# Patient Record
Sex: Male | Born: 1935
Health system: Southern US, Community
[De-identification: ages and names within clinical notes are randomized; demographics above are authoritative.]

## PROBLEM LIST (undated history)

## (undated) DIAGNOSIS — I10 Essential (primary) hypertension: Secondary | ICD-10-CM

## (undated) DIAGNOSIS — N200 Calculus of kidney: Secondary | ICD-10-CM

## (undated) DIAGNOSIS — E119 Type 2 diabetes mellitus without complications: Secondary | ICD-10-CM

## (undated) DIAGNOSIS — E785 Hyperlipidemia, unspecified: Secondary | ICD-10-CM

## (undated) DIAGNOSIS — K429 Umbilical hernia without obstruction or gangrene: Secondary | ICD-10-CM

## (undated) DIAGNOSIS — F411 Generalized anxiety disorder: Secondary | ICD-10-CM

## (undated) HISTORY — PX: INGUINAL HERNIA REPAIR: SUR1180

## (undated) HISTORY — PX: KIDNEY STONE SURGERY: SHX686

## (undated) HISTORY — DX: Hyperlipidemia, unspecified: E78.5

## (undated) HISTORY — PX: CHOLECYSTECTOMY: SHX55

## (undated) HISTORY — DX: Type 2 diabetes mellitus without complications: E11.9

## (undated) HISTORY — DX: Calculus of kidney: N20.0

## (undated) HISTORY — DX: Umbilical hernia without obstruction or gangrene: K42.9

## (undated) HISTORY — DX: Generalized anxiety disorder: F41.1

## (undated) HISTORY — DX: Essential (primary) hypertension: I10

---

## 2011-02-17 DIAGNOSIS — I10 Essential (primary) hypertension: Secondary | ICD-10-CM | POA: Diagnosis not present

## 2011-02-17 DIAGNOSIS — I2510 Atherosclerotic heart disease of native coronary artery without angina pectoris: Secondary | ICD-10-CM | POA: Diagnosis not present

## 2011-02-17 DIAGNOSIS — E291 Testicular hypofunction: Secondary | ICD-10-CM | POA: Diagnosis not present

## 2011-06-23 DIAGNOSIS — M999 Biomechanical lesion, unspecified: Secondary | ICD-10-CM | POA: Diagnosis not present

## 2011-06-23 DIAGNOSIS — M9981 Other biomechanical lesions of cervical region: Secondary | ICD-10-CM | POA: Diagnosis not present

## 2011-06-23 DIAGNOSIS — M5030 Other cervical disc degeneration, unspecified cervical region: Secondary | ICD-10-CM | POA: Diagnosis not present

## 2011-06-23 DIAGNOSIS — IMO0002 Reserved for concepts with insufficient information to code with codable children: Secondary | ICD-10-CM | POA: Diagnosis not present

## 2011-09-03 DIAGNOSIS — L981 Factitial dermatitis: Secondary | ICD-10-CM | POA: Diagnosis not present

## 2011-09-03 DIAGNOSIS — C44319 Basal cell carcinoma of skin of other parts of face: Secondary | ICD-10-CM | POA: Diagnosis not present

## 2011-09-03 DIAGNOSIS — R233 Spontaneous ecchymoses: Secondary | ICD-10-CM | POA: Diagnosis not present

## 2011-12-04 DIAGNOSIS — C44320 Squamous cell carcinoma of skin of unspecified parts of face: Secondary | ICD-10-CM | POA: Diagnosis not present

## 2011-12-04 DIAGNOSIS — L570 Actinic keratosis: Secondary | ICD-10-CM | POA: Diagnosis not present

## 2012-02-18 DIAGNOSIS — IMO0002 Reserved for concepts with insufficient information to code with codable children: Secondary | ICD-10-CM | POA: Diagnosis not present

## 2012-02-18 DIAGNOSIS — M999 Biomechanical lesion, unspecified: Secondary | ICD-10-CM | POA: Diagnosis not present

## 2012-02-18 DIAGNOSIS — M9981 Other biomechanical lesions of cervical region: Secondary | ICD-10-CM | POA: Diagnosis not present

## 2012-02-18 DIAGNOSIS — M5030 Other cervical disc degeneration, unspecified cervical region: Secondary | ICD-10-CM | POA: Diagnosis not present

## 2012-03-18 DIAGNOSIS — M9981 Other biomechanical lesions of cervical region: Secondary | ICD-10-CM | POA: Diagnosis not present

## 2012-03-18 DIAGNOSIS — IMO0002 Reserved for concepts with insufficient information to code with codable children: Secondary | ICD-10-CM | POA: Diagnosis not present

## 2012-03-24 DIAGNOSIS — I10 Essential (primary) hypertension: Secondary | ICD-10-CM | POA: Diagnosis not present

## 2012-03-24 DIAGNOSIS — R5381 Other malaise: Secondary | ICD-10-CM | POA: Diagnosis not present

## 2012-03-31 DIAGNOSIS — E119 Type 2 diabetes mellitus without complications: Secondary | ICD-10-CM | POA: Diagnosis not present

## 2012-03-31 DIAGNOSIS — E785 Hyperlipidemia, unspecified: Secondary | ICD-10-CM | POA: Diagnosis not present

## 2012-03-31 DIAGNOSIS — G4700 Insomnia, unspecified: Secondary | ICD-10-CM | POA: Diagnosis not present

## 2012-06-02 DIAGNOSIS — H2510 Age-related nuclear cataract, unspecified eye: Secondary | ICD-10-CM | POA: Diagnosis not present

## 2012-09-30 DIAGNOSIS — K4090 Unilateral inguinal hernia, without obstruction or gangrene, not specified as recurrent: Secondary | ICD-10-CM | POA: Diagnosis not present

## 2012-09-30 DIAGNOSIS — R109 Unspecified abdominal pain: Secondary | ICD-10-CM | POA: Diagnosis not present

## 2012-09-30 DIAGNOSIS — N390 Urinary tract infection, site not specified: Secondary | ICD-10-CM | POA: Diagnosis not present

## 2012-09-30 DIAGNOSIS — R351 Nocturia: Secondary | ICD-10-CM | POA: Diagnosis not present

## 2012-10-05 DIAGNOSIS — K4090 Unilateral inguinal hernia, without obstruction or gangrene, not specified as recurrent: Secondary | ICD-10-CM | POA: Diagnosis not present

## 2012-10-05 DIAGNOSIS — I10 Essential (primary) hypertension: Secondary | ICD-10-CM | POA: Diagnosis not present

## 2012-10-05 DIAGNOSIS — E119 Type 2 diabetes mellitus without complications: Secondary | ICD-10-CM | POA: Diagnosis not present

## 2012-10-05 DIAGNOSIS — N400 Enlarged prostate without lower urinary tract symptoms: Secondary | ICD-10-CM | POA: Diagnosis not present

## 2012-10-05 DIAGNOSIS — R109 Unspecified abdominal pain: Secondary | ICD-10-CM | POA: Diagnosis not present

## 2012-10-05 DIAGNOSIS — N401 Enlarged prostate with lower urinary tract symptoms: Secondary | ICD-10-CM | POA: Diagnosis not present

## 2012-10-05 DIAGNOSIS — N390 Urinary tract infection, site not specified: Secondary | ICD-10-CM | POA: Diagnosis not present

## 2012-11-17 DIAGNOSIS — N390 Urinary tract infection, site not specified: Secondary | ICD-10-CM | POA: Diagnosis not present

## 2012-11-17 DIAGNOSIS — N401 Enlarged prostate with lower urinary tract symptoms: Secondary | ICD-10-CM | POA: Diagnosis not present

## 2012-11-17 DIAGNOSIS — K4090 Unilateral inguinal hernia, without obstruction or gangrene, not specified as recurrent: Secondary | ICD-10-CM | POA: Diagnosis not present

## 2012-11-17 DIAGNOSIS — N318 Other neuromuscular dysfunction of bladder: Secondary | ICD-10-CM | POA: Diagnosis not present

## 2013-01-05 DIAGNOSIS — I10 Essential (primary) hypertension: Secondary | ICD-10-CM | POA: Diagnosis not present

## 2013-01-05 DIAGNOSIS — H9319 Tinnitus, unspecified ear: Secondary | ICD-10-CM | POA: Diagnosis not present

## 2013-01-05 DIAGNOSIS — E1149 Type 2 diabetes mellitus with other diabetic neurological complication: Secondary | ICD-10-CM | POA: Diagnosis not present

## 2013-01-05 DIAGNOSIS — E119 Type 2 diabetes mellitus without complications: Secondary | ICD-10-CM | POA: Diagnosis not present

## 2013-01-06 DIAGNOSIS — E119 Type 2 diabetes mellitus without complications: Secondary | ICD-10-CM | POA: Diagnosis not present

## 2013-06-05 DIAGNOSIS — H2510 Age-related nuclear cataract, unspecified eye: Secondary | ICD-10-CM | POA: Diagnosis not present

## 2013-07-10 DIAGNOSIS — G5600 Carpal tunnel syndrome, unspecified upper limb: Secondary | ICD-10-CM | POA: Diagnosis not present

## 2014-02-08 DIAGNOSIS — E119 Type 2 diabetes mellitus without complications: Secondary | ICD-10-CM | POA: Diagnosis not present

## 2014-02-08 DIAGNOSIS — I10 Essential (primary) hypertension: Secondary | ICD-10-CM | POA: Diagnosis not present

## 2014-02-08 DIAGNOSIS — R5382 Chronic fatigue, unspecified: Secondary | ICD-10-CM | POA: Diagnosis not present

## 2014-02-13 DIAGNOSIS — E559 Vitamin D deficiency, unspecified: Secondary | ICD-10-CM | POA: Diagnosis not present

## 2014-02-13 DIAGNOSIS — E780 Pure hypercholesterolemia: Secondary | ICD-10-CM | POA: Diagnosis not present

## 2014-02-13 DIAGNOSIS — Z79810 Long term (current) use of selective estrogen receptor modulators (SERMs): Secondary | ICD-10-CM | POA: Diagnosis not present

## 2014-02-13 DIAGNOSIS — R972 Elevated prostate specific antigen [PSA]: Secondary | ICD-10-CM | POA: Diagnosis not present

## 2014-02-13 DIAGNOSIS — R531 Weakness: Secondary | ICD-10-CM | POA: Diagnosis not present

## 2014-02-13 DIAGNOSIS — E119 Type 2 diabetes mellitus without complications: Secondary | ICD-10-CM | POA: Diagnosis not present

## 2014-03-24 DIAGNOSIS — L820 Inflamed seborrheic keratosis: Secondary | ICD-10-CM | POA: Diagnosis not present

## 2014-03-24 DIAGNOSIS — L304 Erythema intertrigo: Secondary | ICD-10-CM | POA: Diagnosis not present

## 2014-03-24 DIAGNOSIS — L0100 Impetigo, unspecified: Secondary | ICD-10-CM | POA: Diagnosis not present

## 2014-06-04 DIAGNOSIS — E119 Type 2 diabetes mellitus without complications: Secondary | ICD-10-CM | POA: Diagnosis not present

## 2014-06-04 DIAGNOSIS — I10 Essential (primary) hypertension: Secondary | ICD-10-CM | POA: Diagnosis not present

## 2014-06-04 DIAGNOSIS — E785 Hyperlipidemia, unspecified: Secondary | ICD-10-CM | POA: Diagnosis not present

## 2014-06-18 DIAGNOSIS — E119 Type 2 diabetes mellitus without complications: Secondary | ICD-10-CM | POA: Diagnosis not present

## 2014-06-18 DIAGNOSIS — H2513 Age-related nuclear cataract, bilateral: Secondary | ICD-10-CM | POA: Diagnosis not present

## 2014-06-18 DIAGNOSIS — H524 Presbyopia: Secondary | ICD-10-CM | POA: Diagnosis not present

## 2014-08-29 DIAGNOSIS — S61422A Laceration with foreign body of left hand, initial encounter: Secondary | ICD-10-CM | POA: Diagnosis not present

## 2014-09-13 DIAGNOSIS — I10 Essential (primary) hypertension: Secondary | ICD-10-CM | POA: Diagnosis not present

## 2014-11-30 DIAGNOSIS — E7800 Pure hypercholesterolemia, unspecified: Secondary | ICD-10-CM | POA: Diagnosis not present

## 2014-11-30 DIAGNOSIS — E119 Type 2 diabetes mellitus without complications: Secondary | ICD-10-CM | POA: Diagnosis not present

## 2014-11-30 DIAGNOSIS — I10 Essential (primary) hypertension: Secondary | ICD-10-CM | POA: Diagnosis not present

## 2014-12-03 DIAGNOSIS — E7800 Pure hypercholesterolemia, unspecified: Secondary | ICD-10-CM | POA: Diagnosis not present

## 2014-12-03 DIAGNOSIS — E119 Type 2 diabetes mellitus without complications: Secondary | ICD-10-CM | POA: Diagnosis not present

## 2015-03-04 DIAGNOSIS — E119 Type 2 diabetes mellitus without complications: Secondary | ICD-10-CM | POA: Diagnosis not present

## 2015-06-12 DIAGNOSIS — E119 Type 2 diabetes mellitus without complications: Secondary | ICD-10-CM | POA: Diagnosis not present

## 2015-06-12 DIAGNOSIS — E7800 Pure hypercholesterolemia, unspecified: Secondary | ICD-10-CM | POA: Diagnosis not present

## 2015-06-12 DIAGNOSIS — Z1389 Encounter for screening for other disorder: Secondary | ICD-10-CM | POA: Diagnosis not present

## 2015-06-12 DIAGNOSIS — I10 Essential (primary) hypertension: Secondary | ICD-10-CM | POA: Diagnosis not present

## 2015-06-20 DIAGNOSIS — E1100 Type 2 diabetes mellitus with hyperosmolarity without nonketotic hyperglycemic-hyperosmolar coma (NKHHC): Secondary | ICD-10-CM | POA: Diagnosis not present

## 2015-06-20 DIAGNOSIS — H2513 Age-related nuclear cataract, bilateral: Secondary | ICD-10-CM | POA: Diagnosis not present

## 2015-07-05 DIAGNOSIS — Z1212 Encounter for screening for malignant neoplasm of rectum: Secondary | ICD-10-CM | POA: Diagnosis not present

## 2015-09-18 DIAGNOSIS — E119 Type 2 diabetes mellitus without complications: Secondary | ICD-10-CM | POA: Diagnosis not present

## 2015-09-18 DIAGNOSIS — Z23 Encounter for immunization: Secondary | ICD-10-CM | POA: Diagnosis not present

## 2015-10-07 DIAGNOSIS — K625 Hemorrhage of anus and rectum: Secondary | ICD-10-CM | POA: Diagnosis not present

## 2015-10-09 DIAGNOSIS — E611 Iron deficiency: Secondary | ICD-10-CM | POA: Diagnosis not present

## 2015-10-09 DIAGNOSIS — K5730 Diverticulosis of large intestine without perforation or abscess without bleeding: Secondary | ICD-10-CM | POA: Diagnosis not present

## 2015-10-11 DIAGNOSIS — D124 Benign neoplasm of descending colon: Secondary | ICD-10-CM | POA: Diagnosis not present

## 2015-10-11 DIAGNOSIS — Z7984 Long term (current) use of oral hypoglycemic drugs: Secondary | ICD-10-CM | POA: Diagnosis not present

## 2015-10-11 DIAGNOSIS — Z79899 Other long term (current) drug therapy: Secondary | ICD-10-CM | POA: Diagnosis not present

## 2015-10-11 DIAGNOSIS — D126 Benign neoplasm of colon, unspecified: Secondary | ICD-10-CM | POA: Diagnosis not present

## 2015-10-11 DIAGNOSIS — K921 Melena: Secondary | ICD-10-CM | POA: Diagnosis not present

## 2015-10-11 DIAGNOSIS — I10 Essential (primary) hypertension: Secondary | ICD-10-CM | POA: Diagnosis not present

## 2015-10-11 DIAGNOSIS — E119 Type 2 diabetes mellitus without complications: Secondary | ICD-10-CM | POA: Diagnosis not present

## 2015-10-11 DIAGNOSIS — K648 Other hemorrhoids: Secondary | ICD-10-CM | POA: Diagnosis not present

## 2015-10-11 DIAGNOSIS — K5730 Diverticulosis of large intestine without perforation or abscess without bleeding: Secondary | ICD-10-CM | POA: Diagnosis not present

## 2015-12-24 DIAGNOSIS — I10 Essential (primary) hypertension: Secondary | ICD-10-CM | POA: Diagnosis not present

## 2015-12-24 DIAGNOSIS — K5790 Diverticulosis of intestine, part unspecified, without perforation or abscess without bleeding: Secondary | ICD-10-CM | POA: Diagnosis not present

## 2015-12-24 DIAGNOSIS — E119 Type 2 diabetes mellitus without complications: Secondary | ICD-10-CM | POA: Diagnosis not present

## 2016-01-28 DIAGNOSIS — L821 Other seborrheic keratosis: Secondary | ICD-10-CM | POA: Diagnosis not present

## 2016-01-28 DIAGNOSIS — C44311 Basal cell carcinoma of skin of nose: Secondary | ICD-10-CM | POA: Diagnosis not present

## 2016-01-28 DIAGNOSIS — L814 Other melanin hyperpigmentation: Secondary | ICD-10-CM | POA: Diagnosis not present

## 2016-02-26 DIAGNOSIS — C44311 Basal cell carcinoma of skin of nose: Secondary | ICD-10-CM | POA: Diagnosis not present

## 2016-03-25 DIAGNOSIS — E119 Type 2 diabetes mellitus without complications: Secondary | ICD-10-CM | POA: Diagnosis not present

## 2016-05-01 DIAGNOSIS — F411 Generalized anxiety disorder: Secondary | ICD-10-CM | POA: Diagnosis not present

## 2016-06-18 DIAGNOSIS — E119 Type 2 diabetes mellitus without complications: Secondary | ICD-10-CM | POA: Diagnosis not present

## 2016-06-18 DIAGNOSIS — Z1389 Encounter for screening for other disorder: Secondary | ICD-10-CM | POA: Diagnosis not present

## 2016-06-18 DIAGNOSIS — F411 Generalized anxiety disorder: Secondary | ICD-10-CM | POA: Diagnosis not present

## 2016-06-18 DIAGNOSIS — E7800 Pure hypercholesterolemia, unspecified: Secondary | ICD-10-CM | POA: Diagnosis not present

## 2016-06-18 DIAGNOSIS — Z6825 Body mass index (BMI) 25.0-25.9, adult: Secondary | ICD-10-CM | POA: Diagnosis not present

## 2016-06-18 DIAGNOSIS — I10 Essential (primary) hypertension: Secondary | ICD-10-CM | POA: Diagnosis not present

## 2016-06-18 DIAGNOSIS — R3911 Hesitancy of micturition: Secondary | ICD-10-CM | POA: Diagnosis not present

## 2016-06-23 DIAGNOSIS — H2513 Age-related nuclear cataract, bilateral: Secondary | ICD-10-CM | POA: Diagnosis not present

## 2016-06-23 DIAGNOSIS — E119 Type 2 diabetes mellitus without complications: Secondary | ICD-10-CM | POA: Diagnosis not present

## 2016-06-23 DIAGNOSIS — H524 Presbyopia: Secondary | ICD-10-CM | POA: Diagnosis not present

## 2016-08-14 DIAGNOSIS — S30861A Insect bite (nonvenomous) of abdominal wall, initial encounter: Secondary | ICD-10-CM | POA: Diagnosis not present

## 2016-08-18 ENCOUNTER — Other Ambulatory Visit: Payer: Self-pay

## 2016-09-18 DIAGNOSIS — E119 Type 2 diabetes mellitus without complications: Secondary | ICD-10-CM | POA: Diagnosis not present

## 2016-10-12 DIAGNOSIS — Z1389 Encounter for screening for other disorder: Secondary | ICD-10-CM | POA: Diagnosis not present

## 2016-10-12 DIAGNOSIS — Z23 Encounter for immunization: Secondary | ICD-10-CM | POA: Diagnosis not present

## 2016-10-12 DIAGNOSIS — R413 Other amnesia: Secondary | ICD-10-CM | POA: Diagnosis not present

## 2016-10-12 DIAGNOSIS — N390 Urinary tract infection, site not specified: Secondary | ICD-10-CM | POA: Diagnosis not present

## 2016-11-11 DIAGNOSIS — E559 Vitamin D deficiency, unspecified: Secondary | ICD-10-CM | POA: Diagnosis not present

## 2016-11-11 DIAGNOSIS — E119 Type 2 diabetes mellitus without complications: Secondary | ICD-10-CM | POA: Diagnosis not present

## 2016-11-11 DIAGNOSIS — R413 Other amnesia: Secondary | ICD-10-CM | POA: Diagnosis not present

## 2016-11-11 DIAGNOSIS — I10 Essential (primary) hypertension: Secondary | ICD-10-CM | POA: Diagnosis not present

## 2016-12-14 DIAGNOSIS — L570 Actinic keratosis: Secondary | ICD-10-CM | POA: Diagnosis not present

## 2016-12-24 DIAGNOSIS — F0280 Dementia in other diseases classified elsewhere without behavioral disturbance: Secondary | ICD-10-CM | POA: Diagnosis not present

## 2016-12-24 DIAGNOSIS — F411 Generalized anxiety disorder: Secondary | ICD-10-CM | POA: Diagnosis not present

## 2016-12-24 DIAGNOSIS — G309 Alzheimer's disease, unspecified: Secondary | ICD-10-CM | POA: Diagnosis not present

## 2016-12-24 DIAGNOSIS — E119 Type 2 diabetes mellitus without complications: Secondary | ICD-10-CM | POA: Diagnosis not present

## 2017-02-15 DIAGNOSIS — C44311 Basal cell carcinoma of skin of nose: Secondary | ICD-10-CM | POA: Diagnosis not present

## 2017-02-15 DIAGNOSIS — L570 Actinic keratosis: Secondary | ICD-10-CM | POA: Diagnosis not present

## 2017-03-25 DIAGNOSIS — F0280 Dementia in other diseases classified elsewhere without behavioral disturbance: Secondary | ICD-10-CM | POA: Diagnosis not present

## 2017-03-25 DIAGNOSIS — E119 Type 2 diabetes mellitus without complications: Secondary | ICD-10-CM | POA: Diagnosis not present

## 2017-03-25 DIAGNOSIS — G301 Alzheimer's disease with late onset: Secondary | ICD-10-CM | POA: Diagnosis not present

## 2017-03-25 DIAGNOSIS — I10 Essential (primary) hypertension: Secondary | ICD-10-CM | POA: Diagnosis not present

## 2017-06-24 DIAGNOSIS — E119 Type 2 diabetes mellitus without complications: Secondary | ICD-10-CM | POA: Diagnosis not present

## 2017-06-28 DIAGNOSIS — G309 Alzheimer's disease, unspecified: Secondary | ICD-10-CM | POA: Diagnosis not present

## 2017-06-28 DIAGNOSIS — E559 Vitamin D deficiency, unspecified: Secondary | ICD-10-CM | POA: Diagnosis not present

## 2017-06-28 DIAGNOSIS — F411 Generalized anxiety disorder: Secondary | ICD-10-CM | POA: Diagnosis not present

## 2017-06-28 DIAGNOSIS — R253 Fasciculation: Secondary | ICD-10-CM | POA: Diagnosis not present

## 2017-06-28 DIAGNOSIS — E7800 Pure hypercholesterolemia, unspecified: Secondary | ICD-10-CM | POA: Diagnosis not present

## 2017-06-28 DIAGNOSIS — N400 Enlarged prostate without lower urinary tract symptoms: Secondary | ICD-10-CM | POA: Diagnosis not present

## 2017-06-28 DIAGNOSIS — F0280 Dementia in other diseases classified elsewhere without behavioral disturbance: Secondary | ICD-10-CM | POA: Diagnosis not present

## 2017-06-28 DIAGNOSIS — E119 Type 2 diabetes mellitus without complications: Secondary | ICD-10-CM | POA: Diagnosis not present

## 2017-06-28 DIAGNOSIS — Z6824 Body mass index (BMI) 24.0-24.9, adult: Secondary | ICD-10-CM | POA: Diagnosis not present

## 2017-06-28 DIAGNOSIS — Z87442 Personal history of urinary calculi: Secondary | ICD-10-CM | POA: Diagnosis not present

## 2017-06-28 DIAGNOSIS — Z1389 Encounter for screening for other disorder: Secondary | ICD-10-CM | POA: Diagnosis not present

## 2017-06-28 DIAGNOSIS — I10 Essential (primary) hypertension: Secondary | ICD-10-CM | POA: Diagnosis not present

## 2017-08-16 DIAGNOSIS — L578 Other skin changes due to chronic exposure to nonionizing radiation: Secondary | ICD-10-CM | POA: Diagnosis not present

## 2017-08-16 DIAGNOSIS — C44311 Basal cell carcinoma of skin of nose: Secondary | ICD-10-CM | POA: Diagnosis not present

## 2017-08-16 DIAGNOSIS — L219 Seborrheic dermatitis, unspecified: Secondary | ICD-10-CM | POA: Diagnosis not present

## 2017-08-16 DIAGNOSIS — L570 Actinic keratosis: Secondary | ICD-10-CM | POA: Diagnosis not present

## 2017-09-29 DIAGNOSIS — I10 Essential (primary) hypertension: Secondary | ICD-10-CM | POA: Diagnosis not present

## 2017-09-29 DIAGNOSIS — Z23 Encounter for immunization: Secondary | ICD-10-CM | POA: Diagnosis not present

## 2017-09-29 DIAGNOSIS — E119 Type 2 diabetes mellitus without complications: Secondary | ICD-10-CM | POA: Diagnosis not present

## 2017-10-20 ENCOUNTER — Telehealth: Payer: Self-pay | Admitting: Neurology

## 2017-10-20 ENCOUNTER — Encounter: Payer: Self-pay | Admitting: Neurology

## 2017-10-20 ENCOUNTER — Ambulatory Visit (INDEPENDENT_AMBULATORY_CARE_PROVIDER_SITE_OTHER): Payer: Medicare Other | Admitting: Neurology

## 2017-10-20 VITALS — BP 96/62 | HR 70 | Ht 65.5 in | Wt 146.0 lb

## 2017-10-20 DIAGNOSIS — R259 Unspecified abnormal involuntary movements: Secondary | ICD-10-CM | POA: Diagnosis not present

## 2017-10-20 NOTE — Progress Notes (Signed)
Subjective:    Patient ID: Ralph West is a 84 y.o. male.  HPI     Star Age, MD, PhD [facility] [address], [address]O. [place],  [address]  Dear Dr. Nona Dell,   I saw your patient, Ralph West, upon your kind request in my neurologic clinic today for initial consultation of his twitching affecting the head and left leg tremor noticed in May 2019. Patient is accompanied by his wife today. As you know, [name] is an 84 year old right-handed gentleman with an underlying complex medical history of hypertension, hyperlipidemia, memory loss, anxiety, type 2 diabetes, kidney stones, BPH, and vitamin D deficiency, who reports very little of his own history. His wife reports 3 episodes of involuntary movements recently. Symptoms started about mid April when he had an episode of side-to-side head movement was. He was conscious and responsive at the time. He denied any headache in that moment, side to side head twitching lasted about 15-20 seconds per wife. 2 or 3 weeks later he had an episode of left leg up and down movement, like a stomping , also lasted about 20 seconds. She noticed that the time that he had some garbled speech, was difficult to understand but reverted to normal speech for him about 20 seconds later, no postictal confusion. He may have woken up from a nap but she does not know for sure, he tends to sleep a lot during the day. He was sitting on the couch at the time. About 2 months ago he had an episode of upper extremities and lower extremities flailing, random movements, no repetitive or stereotypical movements. He was responsive, may have woken up from a nap but she is not sure. He has never had any falls from this, never had any convulsive episodes, no loss of consciousness.no one-sided weakness, numbness, tingling or slurring of speech otherwise, no facial droop. He has been on low-dose sertraline for the past few months. He has been on  Aricept generic for the past 1 or 2 years as she recalls. I reviewed your office note from 06/28/2017, which you kindly included. As far as they know he has never had a head CT or MRI brain. He had blood work through your office in June 2019. I reviewed the test results from 06/28/2017: CBC with differential was unremarkable, CMP showed a glucose level of 119, otherwise unremarkable findings, lipid panel showed LDL of 101, otherwise unremarkable findings, A1c 6.1, normal TSH. Vitamin D on the lower end at 33.6.  His Past Medical History Is Significant For: Past Medical History:  Diagnosis Date  . Anxiety, generalized   . Diabetes mellitus without complication ([place])   . Hernia, umbilical   . Hyperlipidemia   . Hypertension   . Kidney stones      His Past Surgical History Is Significant For:  His Family History Is Significant For: Family History  Problem Relation Age of Onset  . Alzheimer's disease Sister   . Cancer Brother     His Social History Is Significant For: Social History   Socioeconomic History  . Marital status: Married    Spouse name: Not on file  . Number of children: Not on file  . Years of education: Not on file  . Highest education level: Not on file  Occupational History  . Not on file  Social Needs  . Financial resource strain: Not on file  . Food insecurity:    Worry: Not on file  Inability: Not on file  . Transportation needs:    Medical: Not on file    Non-medical: Not on file  Tobacco Use  . Smoking status: Former Research scientist (life sciences)  . Smokeless tobacco: Never Used  Substance and Sexual Activity  . Alcohol use: Not Currently  . Drug use: Not on file  . Sexual activity: Not on file  Lifestyle  . Physical activity:    Days per week: Not on file    Minutes per session: Not on file  . Stress: Not on file  Relationships  . Social connections:    Talks on phone: Not on file    Gets together: Not on file    Attends religious service: Not on file    Active  member of club or organization: Not on file    Attends meetings of clubs or organizations: Not on file    Relationship status: Not on file  Other Topics Concern  . Not on file  Social History Narrative   Pt lives at home with his wife. He has 3 children. Pt completed grade school.    His Allergies Are:  No Known Allergies:   His Current Medications Are:  Outpatient Encounter Medications as of 10/20/2017  Medication Sig  . aspirin EC 81 MG tablet Take 81 mg by mouth daily.  Marland Kitchen atenolol-chlorthalidone (TENORETIC) 50-25 MG tablet Take 1 tablet by mouth daily.  Marland Kitchen donepezil (ARICEPT) 10 MG tablet Take 10 mg by mouth daily.  . metFORMIN (GLUCOPHAGE) 500 MG tablet Take 250 mg by mouth 2 (two) times daily with a meal.   . multivitamin-iron-minerals-folic acid (CENTRUM) chewable tablet Chew 1 tablet by mouth daily.  . Red Yeast Rice 600 MG CAPS Take by mouth.  . saw palmetto 160 MG capsule Take 160 mg by mouth 2 (two) times daily.  . sertraline (ZOLOFT) 25 MG tablet Take 25 mg by mouth daily.  . [DISCONTINUED] sildenafil (VIAGRA) 100 MG tablet Take 100 mg by mouth daily as needed for erectile dysfunction.  . [DISCONTINUED] nystatin (NYSTATIN) powder Apply topically 4 (four) times daily.  . [DISCONTINUED] saw palmetto 500 MG capsule Take 500 mg by mouth daily.   No facility-administered encounter medications on file as of 10/20/2017.   : Review of Systems:  Out of a complete 14 point review of systems, all are reviewed and negative with the exception of these symptoms as listed below:  Review of Systems  Neurological:       Pt presents today to discuss his episodes of involuntary muscle movements with garbled speech. Pt's wife has noticed pt having these episodes. They were told that these episodes were not seizures.    Objective:  Neurological Exam  Physical Exam Physical Examination:   Vitals:   10/20/17 1001  BP: 96/62  Pulse: 70    General Examination: The patient is a very  pleasant 84 y.o. male in no acute distress. He appears well-developed and well-nourished and well groomed.   HEENT: Normocephalic, atraumatic, pupils are equal, round and reactive to light and accommodation.corrective eyeglasses in place. Face is symmetric. Hearing mildly impaired, right hearing aid in place. Speech is clear, scant, no involuntary head or neck movements noted, no dysarthria. Airway examination reveals full dentures, mild mouth dryness, otherwise no abnormal tongue movements and airway appearance is benign.  Chest: Clear to auscultation without wheezing, rhonchi or crackles noted.  Heart: S1+S2+0, regular and normal without murmurs, rubs or gallops noted.   Abdomen: Soft, non-tender and non-distended with normal bowel sounds  appreciated on auscultation.  Extremities: There is no pitting edema in the distal lower extremities bilaterally.   Skin: Warm and dry without trophic changes noted.  Musculoskeletal: exam reveals no obvious joint deformities, tenderness or joint swelling or erythema.   Neurologically:  Mental status: The patient is awake, alert and pays attention. Immediate and remote memory, attention, language skills and fund of knowledge are impaired. There is no evidence of aphasia, agnosia, apraxia or anomia. Speech is clear with normal prosody and enunciation. Thought process is linear. Mood is normal and affect is normal.  Cranial nerves II - XII are as described above under HEENT exam. In addition: shoulder shrug is normal with equal shoulder height noted. Motor exam: Normal bulk, strength and tone is noted. There is no drift, tremor or rebound. Romberg is negative. Reflexes are 1+, absent in the ankles. Fine motor skills and coordination: globally mildly impaired.  Cerebellar testing: No dysmetria or intention tremor. There is no truncal or gait ataxia.  Sensory exam: intact to light touch in the upper and lower extremities.  Gait, station and balance: He stands  easily. No veering to one side is noted. No leaning to one side is noted. Posture is age-appropriate and stance is narrow based. Gait shows normal stride length and normal pace. No problems turning are noted.   Assessment and Plan:    In summary, Celestino Ackerman is a very pleasant 84 y.o.-year old male with an underlying complex medical history of hypertension, hyperlipidemia, memory loss, anxiety, type 2 diabetes, kidney stones, BPH, and vitamin D deficiency, who presents for evaluation of his brief spells of twitching or abnormal involuntary movements. He is currently without symptoms. It is difficult to say what prompted these movements as they were not stereotypical, affected different parts of the body, description not telltale for seizure-like events. Nevertheless, differential diagnosis may include involuntary movement secondary to medication, confusional arousals or dream enactment behavior. I would like to proceed with a brain MRI to rule out a structural cause of his symptoms and an EEG to look at brain electrical activity. So long as his tests are age-appropriate and nonrevealing, I can see him back on an as-needed basis. I did ask him to stay well hydrated, well rested, change positions slowly, still well-nourished. As far as medications are concerned, I recommended the following at this time: no change.  I answered all their questions today and the patient and his wife were in agreement with the above outlined plan. Thank you very much for allowing me to participate in the care of this nice patient. If I can be of any further assistance to you please do not hesitate to call me at [phone].  Sincerely,   Star Age, MD, PhD

## 2017-10-20 NOTE — Patient Instructions (Addendum)
I am not sure how to explain your twitching or involuntary movements. Possibilities include twitching as a medication side effect, such as from the sertraline, confusion and abnormal movements as part of waking up from a nap (called confusional arousal). I would doubt, that these are seizures.  Our plan: We will do a brain scan, called MRI and call you with the test results. We will have to schedule you for this on a separate date. This test requires authorization from your insurance, and we will take care of the insurance process. We will check your liver and kidney function first today, to make sure you are safe to get contrast for the MRI. We will do an EEG (brainwave test), which we will schedule. We will call you with the results.  So long as your test results are age appropriate, I can see you back as needed. Please try to stay well hydrated with water, stay well rested and eat a healthy/balanced diet.

## 2017-10-20 NOTE — Telephone Encounter (Signed)
Medicare/[employer] GEHA Auth: NPR Ref # [id number] T on 10/24/17 mnt time order sent to GI. They will reach out to the pt to schedule.

## 2017-10-21 LAB — COMPREHENSIVE METABOLIC PANEL
A/G RATIO: 2.2 (ref 1.2–2.2)
ALBUMIN: 4.4 g/dL (ref 3.5–4.7)
ALT: 15 IU/L (ref 0–44)
AST: 18 IU/L (ref 0–40)
Alkaline Phosphatase: 80 IU/L (ref 39–117)
BUN / CREAT RATIO: 19 (ref 10–24)
BUN: 19 mg/dL (ref 8–27)
Bilirubin Total: 1 mg/dL (ref 0.0–1.2)
CALCIUM: 9.9 mg/dL (ref 8.6–10.2)
CO2: 26 mmol/L (ref 20–29)
CREATININE: 1.01 mg/dL (ref 0.76–1.27)
Chloride: 97 mmol/L (ref 96–106)
GFR, EST AFRICAN AMERICAN: 79 mL/min/{1.73_m2} (ref >59)
GFR, EST NON AFRICAN AMERICAN: 68 mL/min/{1.73_m2} (ref >59)
GLOBULIN, TOTAL: 2 g/dL (ref 1.5–4.5)
Glucose: 101 mg/dL — ABNORMAL HIGH (ref 65–99)
Potassium: 4 mmol/L (ref 3.5–5.2)
SODIUM: 140 mmol/L (ref 134–144)
TOTAL PROTEIN: 6.4 g/dL (ref 6.0–8.5)

## 2017-11-03 ENCOUNTER — Ambulatory Visit (INDEPENDENT_AMBULATORY_CARE_PROVIDER_SITE_OTHER): Payer: Medicare Other | Admitting: Neurology

## 2017-11-03 DIAGNOSIS — R258 Other abnormal involuntary movements: Secondary | ICD-10-CM | POA: Diagnosis not present

## 2017-11-03 DIAGNOSIS — R259 Unspecified abnormal involuntary movements: Secondary | ICD-10-CM

## 2017-11-03 NOTE — Procedures (Signed)
    History:  Ralph West is an 84 year old gentleman with a history of head and left leg tremor since May 2019.  The patient has had 3 episodes of involuntary movements with side-to-side head movements.  These episodes lasted 15 to 20 seconds.  The patient is being evaluated for these events.  This is a routine EEG.  No skull defects are noted.  Medications include aspirin, Tenoretic, Aricept, metformin, multivitamins, red yeast rice, Zoloft, and saw palmetto.  EEG classification: Normal awake  Description of the recording: The background rhythms of this recording consists of a fairly well modulated medium amplitude alpha rhythm of 9 Hz that is reactive to eye opening and closure. As the record progresses, the patient appears to remain in the waking state throughout the recording. Photic stimulation was performed, resulting in a bilateral and symmetric photic driving response. Hyperventilation was also performed, resulting in a minimal buildup of the background rhythm activities without significant slowing seen. At no time during the recording does there appear to be evidence of spike or spike wave discharges or evidence of focal slowing. EKG monitor shows no evidence of cardiac rhythm abnormalities with a heart rate of 66.  Impression: This is a normal EEG recording in the waking state. No evidence of ictal or interictal discharges are seen.

## 2017-11-04 ENCOUNTER — Telehealth: Payer: Self-pay

## 2017-11-04 NOTE — Telephone Encounter (Signed)
Patient is scheduled at [facility] for 11/08/17 arrival time is 11:00 AM patient wife is aware of time & day. She also has their number of [phone] if she needed to r/s for any reason.

## 2017-11-04 NOTE — Progress Notes (Signed)
Please call and advise the patient that the EEG or brain wave test we performed was reported as normal in the awake state. We checked for abnormal electrical discharges in the brain waves and the report suggested normal findings. No further action is required on this test at this time. Please remind patient to keep any upcoming appointments or tests and to call us with any interim questions, concerns, problems or updates. Plan was to do MRI brain as well.  Thanks,  Star Age, MD, PhD

## 2017-11-04 NOTE — Telephone Encounter (Signed)
Patient is scheduled at [place] for 11/08/17 arrival time is 11:00 AM patient wife is aware of time & day. She also has their number of [phone] if she needed to r/s for any reason.

## 2017-11-04 NOTE — Telephone Encounter (Signed)
I called pt, spoke to [place], wife per DPR, and explained pt's EEG results. Pt's wife verbalized understanding of EEG results.   Pt's wife reports that she has not been called to schedule pt's MRI. I advised that the MRI order has been sent to GI. She is asking it pt can have the MRI in [place] instead of [place]. I advised her that I will have our MRI coordinator call her to discuss.

## 2017-11-04 NOTE — Telephone Encounter (Signed)
-----   Message from Star Age, MD sent at 11/04/2017  8:50 AM EDT ----- Please call and advise the patient that the EEG or brain wave test we performed was reported as normal in the awake state. We checked for abnormal electrical discharges in the brain waves and the report suggested normal findings. No further action is required on this test at this time. Please remind patient to keep any upcoming appointments or tests and to call us with any interim questions, concerns, problems or updates. Plan was to do MRI brain as well.  Thanks,  Star Age, MD, PhD

## 2017-11-08 ENCOUNTER — Encounter: Payer: Self-pay | Admitting: Neurology

## 2017-11-08 DIAGNOSIS — G9389 Other specified disorders of brain: Secondary | ICD-10-CM | POA: Diagnosis not present

## 2017-11-08 DIAGNOSIS — R259 Unspecified abnormal involuntary movements: Secondary | ICD-10-CM | POA: Diagnosis not present

## 2017-11-08 DIAGNOSIS — D320 Benign neoplasm of cerebral meninges: Secondary | ICD-10-CM | POA: Diagnosis not present

## 2017-11-10 ENCOUNTER — Telehealth: Payer: Self-pay

## 2017-11-10 NOTE — Telephone Encounter (Signed)
Received MRI results for pt from [facility].  Will send these results to the [facility] in Dr. Guadelupe Sabin absence for a result note.

## 2017-11-10 NOTE — Telephone Encounter (Signed)
I received a report with the results of her recent MRI head with and without contrast agent for this 84 year old male patient with involuntary movements beginning 6 months ago.  Test date was 11-05-2017 at [facility] radiology  The patient is followed by Dr. Star Age, MD  There is a rounded, homogeneously enhancing extra-axial mass seen in the posterior fossa measuring 1.8 x 1.7 x 1.7 cm.  This appears to be a meningioma.  There has been no swelling or edema surrounding this mass indicating that it has grown very slowly.  There is no evidence of an acute infarction, also known as ischemic stroke.  There is reduced flow in the right vertebral artery but it has not led to any infarction.  There is chronic white matter change throughout the bilateral deep gray matter.  The meningioma is located left of the midline of the cerebellum.    None of these findings would explain involuntary movements.  Larey Seat, MD

## 2017-11-10 NOTE — Telephone Encounter (Signed)
I called pt's wife Vickii Chafe, per DPR, and explained pt's MRI results. I discussed the meningioma, reduced flow in the right vertebral artery, and chronic white matter changes. I advised her that none of these findings would explain his involuntary movements. Pt's wife is asking if Dr. Rexene Alberts will also look at these results and see if she recommends anything other than PCP follow up. Pt's wife reports that pt has dementia and do any of the findings on the MRI correlate with this diagnosis.  Pt's wife is aware that Dr. Rexene Alberts is out of the office and when Dr. Rexene Alberts returns, I will have her look at these MRI results and ask for any further recommendations. Pt's wife verbalized understanding. I have faxed a copy of these results to Dr. Nona Dell per pt's wife's request.

## 2017-11-22 NOTE — Telephone Encounter (Signed)
I called pt's wife, per DPR, and discussed Dr. Guadelupe Sabin recommendations and MRI results. Pt's wife verbalized understanding and appreciation.

## 2017-11-22 NOTE — Telephone Encounter (Signed)
Please call wife back re: additional questions about his brain scan: There were changes in the deeper structures of the brain, which we call white matter changes or microvascular changes. These are tiny white spots, that occur with time and are seen in a variety of conditions, including with normal aging, chronic hypertension, chronic headaches, especially migraine HAs, chronic diabetes, chronic hyperlipidemia. These are not strokes.  In addition, there was a small lesion in the back which is in keeping with a benign tumor.  For this, they can consider a surgical opinion and discuss referral to neurosurgery with PCP.   Other than that, recommendations for prevention of atherosclerosis (hardening of the arteries) include: good blood pressure control, good cholesterol control, good blood sugar control, and keeping a healthy weight, staying well hydrated with water, resting enough at night.  He can FU with PCP.

## 2017-12-30 DIAGNOSIS — G309 Alzheimer's disease, unspecified: Secondary | ICD-10-CM | POA: Diagnosis not present

## 2017-12-30 DIAGNOSIS — E119 Type 2 diabetes mellitus without complications: Secondary | ICD-10-CM | POA: Diagnosis not present

## 2018-01-28 DIAGNOSIS — G309 Alzheimer's disease, unspecified: Secondary | ICD-10-CM | POA: Diagnosis not present

## 2018-01-28 DIAGNOSIS — F0280 Dementia in other diseases classified elsewhere without behavioral disturbance: Secondary | ICD-10-CM | POA: Diagnosis not present

## 2018-01-28 DIAGNOSIS — R410 Disorientation, unspecified: Secondary | ICD-10-CM | POA: Diagnosis not present

## 2018-02-17 DIAGNOSIS — L219 Seborrheic dermatitis, unspecified: Secondary | ICD-10-CM | POA: Diagnosis not present

## 2018-02-17 DIAGNOSIS — C44311 Basal cell carcinoma of skin of nose: Secondary | ICD-10-CM | POA: Diagnosis not present

## 2018-02-17 DIAGNOSIS — L570 Actinic keratosis: Secondary | ICD-10-CM | POA: Diagnosis not present

## 2018-02-17 DIAGNOSIS — L578 Other skin changes due to chronic exposure to nonionizing radiation: Secondary | ICD-10-CM | POA: Diagnosis not present

## 2018-04-01 DIAGNOSIS — G309 Alzheimer's disease, unspecified: Secondary | ICD-10-CM | POA: Diagnosis not present

## 2018-07-06 DIAGNOSIS — E119 Type 2 diabetes mellitus without complications: Secondary | ICD-10-CM | POA: Diagnosis not present

## 2018-07-06 DIAGNOSIS — I10 Essential (primary) hypertension: Secondary | ICD-10-CM | POA: Diagnosis not present

## 2018-07-06 DIAGNOSIS — G301 Alzheimer's disease with late onset: Secondary | ICD-10-CM | POA: Diagnosis not present

## 2018-08-01 DIAGNOSIS — H25813 Combined forms of age-related cataract, bilateral: Secondary | ICD-10-CM | POA: Diagnosis not present

## 2018-10-18 DIAGNOSIS — Z23 Encounter for immunization: Secondary | ICD-10-CM | POA: Diagnosis not present

## 2018-10-18 DIAGNOSIS — G309 Alzheimer's disease, unspecified: Secondary | ICD-10-CM | POA: Diagnosis not present

## 2018-10-18 DIAGNOSIS — Z6824 Body mass index (BMI) 24.0-24.9, adult: Secondary | ICD-10-CM | POA: Diagnosis not present

## 2018-10-18 DIAGNOSIS — F411 Generalized anxiety disorder: Secondary | ICD-10-CM | POA: Diagnosis not present

## 2018-10-18 DIAGNOSIS — N400 Enlarged prostate without lower urinary tract symptoms: Secondary | ICD-10-CM | POA: Diagnosis not present

## 2018-10-18 DIAGNOSIS — E7800 Pure hypercholesterolemia, unspecified: Secondary | ICD-10-CM | POA: Diagnosis not present

## 2018-10-18 DIAGNOSIS — I10 Essential (primary) hypertension: Secondary | ICD-10-CM | POA: Diagnosis not present

## 2018-10-18 DIAGNOSIS — Z1389 Encounter for screening for other disorder: Secondary | ICD-10-CM | POA: Diagnosis not present

## 2018-10-18 DIAGNOSIS — F0280 Dementia in other diseases classified elsewhere without behavioral disturbance: Secondary | ICD-10-CM | POA: Diagnosis not present

## 2018-10-18 DIAGNOSIS — E119 Type 2 diabetes mellitus without complications: Secondary | ICD-10-CM | POA: Diagnosis not present

## 2018-11-01 DIAGNOSIS — N3000 Acute cystitis without hematuria: Secondary | ICD-10-CM | POA: Diagnosis not present

## 2018-11-01 DIAGNOSIS — R972 Elevated prostate specific antigen [PSA]: Secondary | ICD-10-CM | POA: Diagnosis not present

## 2018-11-11 DIAGNOSIS — N3000 Acute cystitis without hematuria: Secondary | ICD-10-CM | POA: Diagnosis not present

## 2018-11-11 DIAGNOSIS — R972 Elevated prostate specific antigen [PSA]: Secondary | ICD-10-CM | POA: Diagnosis not present

## 2018-11-18 DIAGNOSIS — R972 Elevated prostate specific antigen [PSA]: Secondary | ICD-10-CM | POA: Diagnosis not present

## 2019-02-01 DIAGNOSIS — I10 Essential (primary) hypertension: Secondary | ICD-10-CM | POA: Diagnosis not present

## 2019-02-14 DIAGNOSIS — L578 Other skin changes due to chronic exposure to nonionizing radiation: Secondary | ICD-10-CM | POA: Diagnosis not present

## 2019-02-14 DIAGNOSIS — L570 Actinic keratosis: Secondary | ICD-10-CM | POA: Diagnosis not present

## 2019-02-14 DIAGNOSIS — L821 Other seborrheic keratosis: Secondary | ICD-10-CM | POA: Diagnosis not present

## 2019-05-02 DIAGNOSIS — I10 Essential (primary) hypertension: Secondary | ICD-10-CM | POA: Diagnosis not present

## 2019-05-02 DIAGNOSIS — E119 Type 2 diabetes mellitus without complications: Secondary | ICD-10-CM | POA: Diagnosis not present

## 2019-06-01 DIAGNOSIS — N390 Urinary tract infection, site not specified: Secondary | ICD-10-CM | POA: Diagnosis not present

## 2019-06-01 DIAGNOSIS — W19XXXA Unspecified fall, initial encounter: Secondary | ICD-10-CM | POA: Diagnosis not present

## 2019-06-01 DIAGNOSIS — R262 Difficulty in walking, not elsewhere classified: Secondary | ICD-10-CM | POA: Diagnosis not present

## 2019-06-01 DIAGNOSIS — R4182 Altered mental status, unspecified: Secondary | ICD-10-CM | POA: Diagnosis not present

## 2019-06-01 DIAGNOSIS — G309 Alzheimer's disease, unspecified: Secondary | ICD-10-CM | POA: Diagnosis not present

## 2019-06-02 DIAGNOSIS — D72829 Elevated white blood cell count, unspecified: Secondary | ICD-10-CM | POA: Diagnosis not present

## 2019-06-02 DIAGNOSIS — D329 Benign neoplasm of meninges, unspecified: Secondary | ICD-10-CM | POA: Diagnosis not present

## 2019-06-02 DIAGNOSIS — R531 Weakness: Secondary | ICD-10-CM | POA: Diagnosis not present

## 2019-06-02 DIAGNOSIS — K8020 Calculus of gallbladder without cholecystitis without obstruction: Secondary | ICD-10-CM | POA: Diagnosis not present

## 2019-06-02 DIAGNOSIS — K810 Acute cholecystitis: Secondary | ICD-10-CM | POA: Diagnosis not present

## 2019-06-03 DIAGNOSIS — R7881 Bacteremia: Secondary | ICD-10-CM | POA: Diagnosis not present

## 2019-06-03 DIAGNOSIS — R531 Weakness: Secondary | ICD-10-CM | POA: Diagnosis not present

## 2019-06-03 DIAGNOSIS — R319 Hematuria, unspecified: Secondary | ICD-10-CM | POA: Diagnosis present

## 2019-06-03 DIAGNOSIS — J9811 Atelectasis: Secondary | ICD-10-CM | POA: Diagnosis not present

## 2019-06-03 DIAGNOSIS — K8000 Calculus of gallbladder with acute cholecystitis without obstruction: Secondary | ICD-10-CM | POA: Diagnosis present

## 2019-06-03 DIAGNOSIS — E46 Unspecified protein-calorie malnutrition: Secondary | ICD-10-CM | POA: Diagnosis not present

## 2019-06-03 DIAGNOSIS — Z4889 Encounter for other specified surgical aftercare: Secondary | ICD-10-CM | POA: Diagnosis not present

## 2019-06-03 DIAGNOSIS — K8020 Calculus of gallbladder without cholecystitis without obstruction: Secondary | ICD-10-CM | POA: Diagnosis not present

## 2019-06-03 DIAGNOSIS — D329 Benign neoplasm of meninges, unspecified: Secondary | ICD-10-CM | POA: Diagnosis not present

## 2019-06-03 DIAGNOSIS — K810 Acute cholecystitis: Secondary | ICD-10-CM | POA: Diagnosis not present

## 2019-06-03 DIAGNOSIS — R0902 Hypoxemia: Secondary | ICD-10-CM | POA: Diagnosis not present

## 2019-06-03 DIAGNOSIS — I214 Non-ST elevation (NSTEMI) myocardial infarction: Secondary | ICD-10-CM | POA: Diagnosis not present

## 2019-06-03 DIAGNOSIS — R109 Unspecified abdominal pain: Secondary | ICD-10-CM | POA: Diagnosis not present

## 2019-06-03 DIAGNOSIS — E785 Hyperlipidemia, unspecified: Secondary | ICD-10-CM | POA: Diagnosis not present

## 2019-06-03 DIAGNOSIS — I21A1 Myocardial infarction type 2: Secondary | ICD-10-CM | POA: Diagnosis not present

## 2019-06-03 DIAGNOSIS — R2689 Other abnormalities of gait and mobility: Secondary | ICD-10-CM | POA: Diagnosis not present

## 2019-06-03 DIAGNOSIS — Z79899 Other long term (current) drug therapy: Secondary | ICD-10-CM | POA: Diagnosis not present

## 2019-06-03 DIAGNOSIS — D72829 Elevated white blood cell count, unspecified: Secondary | ICD-10-CM | POA: Diagnosis not present

## 2019-06-03 DIAGNOSIS — F339 Major depressive disorder, recurrent, unspecified: Secondary | ICD-10-CM | POA: Diagnosis not present

## 2019-06-03 DIAGNOSIS — R2681 Unsteadiness on feet: Secondary | ICD-10-CM | POA: Diagnosis not present

## 2019-06-03 DIAGNOSIS — Z859 Personal history of malignant neoplasm, unspecified: Secondary | ICD-10-CM | POA: Diagnosis not present

## 2019-06-03 DIAGNOSIS — Z9149 Other personal history of psychological trauma, not elsewhere classified: Secondary | ICD-10-CM | POA: Diagnosis not present

## 2019-06-03 DIAGNOSIS — I10 Essential (primary) hypertension: Secondary | ICD-10-CM | POA: Diagnosis present

## 2019-06-03 DIAGNOSIS — M810 Age-related osteoporosis without current pathological fracture: Secondary | ICD-10-CM | POA: Diagnosis not present

## 2019-06-03 DIAGNOSIS — R0682 Tachypnea, not elsewhere classified: Secondary | ICD-10-CM | POA: Diagnosis not present

## 2019-06-03 DIAGNOSIS — Z7982 Long term (current) use of aspirin: Secondary | ICD-10-CM | POA: Diagnosis not present

## 2019-06-03 DIAGNOSIS — R000 Tachycardia, unspecified: Secondary | ICD-10-CM | POA: Diagnosis not present

## 2019-06-03 DIAGNOSIS — F17210 Nicotine dependence, cigarettes, uncomplicated: Secondary | ICD-10-CM | POA: Diagnosis not present

## 2019-06-03 DIAGNOSIS — Z87891 Personal history of nicotine dependence: Secondary | ICD-10-CM | POA: Diagnosis not present

## 2019-06-03 DIAGNOSIS — F0390 Unspecified dementia without behavioral disturbance: Secondary | ICD-10-CM | POA: Diagnosis present

## 2019-06-03 DIAGNOSIS — D649 Anemia, unspecified: Secondary | ICD-10-CM | POA: Diagnosis not present

## 2019-06-03 DIAGNOSIS — I361 Nonrheumatic tricuspid (valve) insufficiency: Secondary | ICD-10-CM | POA: Diagnosis not present

## 2019-06-03 DIAGNOSIS — M6281 Muscle weakness (generalized): Secondary | ICD-10-CM | POA: Diagnosis not present

## 2019-06-03 DIAGNOSIS — K8010 Calculus of gallbladder with chronic cholecystitis without obstruction: Secondary | ICD-10-CM | POA: Diagnosis not present

## 2019-06-03 DIAGNOSIS — R0789 Other chest pain: Secondary | ICD-10-CM | POA: Diagnosis not present

## 2019-06-03 DIAGNOSIS — R41841 Cognitive communication deficit: Secondary | ICD-10-CM | POA: Diagnosis not present

## 2019-06-03 DIAGNOSIS — Z7401 Bed confinement status: Secondary | ICD-10-CM | POA: Diagnosis not present

## 2019-06-04 DIAGNOSIS — K8000 Calculus of gallbladder with acute cholecystitis without obstruction: Secondary | ICD-10-CM

## 2019-06-04 DIAGNOSIS — I10 Essential (primary) hypertension: Secondary | ICD-10-CM

## 2019-06-04 DIAGNOSIS — I214 Non-ST elevation (NSTEMI) myocardial infarction: Secondary | ICD-10-CM

## 2019-06-04 DIAGNOSIS — Z9149 Other personal history of psychological trauma, not elsewhere classified: Secondary | ICD-10-CM

## 2019-06-04 DIAGNOSIS — I361 Nonrheumatic tricuspid (valve) insufficiency: Secondary | ICD-10-CM

## 2019-06-08 DIAGNOSIS — F339 Major depressive disorder, recurrent, unspecified: Secondary | ICD-10-CM | POA: Diagnosis not present

## 2019-06-08 DIAGNOSIS — R000 Tachycardia, unspecified: Secondary | ICD-10-CM | POA: Diagnosis not present

## 2019-06-08 DIAGNOSIS — I214 Non-ST elevation (NSTEMI) myocardial infarction: Secondary | ICD-10-CM | POA: Diagnosis not present

## 2019-06-08 DIAGNOSIS — Z09 Encounter for follow-up examination after completed treatment for conditions other than malignant neoplasm: Secondary | ICD-10-CM | POA: Diagnosis not present

## 2019-06-08 DIAGNOSIS — Z7401 Bed confinement status: Secondary | ICD-10-CM | POA: Diagnosis not present

## 2019-06-08 DIAGNOSIS — R002 Palpitations: Secondary | ICD-10-CM | POA: Diagnosis not present

## 2019-06-08 DIAGNOSIS — E46 Unspecified protein-calorie malnutrition: Secondary | ICD-10-CM | POA: Diagnosis not present

## 2019-06-08 DIAGNOSIS — M6281 Muscle weakness (generalized): Secondary | ICD-10-CM | POA: Diagnosis not present

## 2019-06-08 DIAGNOSIS — R7881 Bacteremia: Secondary | ICD-10-CM | POA: Diagnosis not present

## 2019-06-08 DIAGNOSIS — R0789 Other chest pain: Secondary | ICD-10-CM | POA: Diagnosis not present

## 2019-06-08 DIAGNOSIS — I2510 Atherosclerotic heart disease of native coronary artery without angina pectoris: Secondary | ICD-10-CM | POA: Diagnosis not present

## 2019-06-08 DIAGNOSIS — R7989 Other specified abnormal findings of blood chemistry: Secondary | ICD-10-CM | POA: Diagnosis not present

## 2019-06-08 DIAGNOSIS — K810 Acute cholecystitis: Secondary | ICD-10-CM | POA: Diagnosis not present

## 2019-06-08 DIAGNOSIS — R279 Unspecified lack of coordination: Secondary | ICD-10-CM | POA: Diagnosis not present

## 2019-06-08 DIAGNOSIS — F0390 Unspecified dementia without behavioral disturbance: Secondary | ICD-10-CM | POA: Diagnosis not present

## 2019-06-08 DIAGNOSIS — E441 Mild protein-calorie malnutrition: Secondary | ICD-10-CM | POA: Diagnosis not present

## 2019-06-08 DIAGNOSIS — I10 Essential (primary) hypertension: Secondary | ICD-10-CM | POA: Diagnosis not present

## 2019-06-08 DIAGNOSIS — Z79899 Other long term (current) drug therapy: Secondary | ICD-10-CM | POA: Diagnosis not present

## 2019-06-08 DIAGNOSIS — R262 Difficulty in walking, not elsewhere classified: Secondary | ICD-10-CM | POA: Diagnosis not present

## 2019-06-08 DIAGNOSIS — R319 Hematuria, unspecified: Secondary | ICD-10-CM | POA: Diagnosis not present

## 2019-06-08 DIAGNOSIS — M810 Age-related osteoporosis without current pathological fracture: Secondary | ICD-10-CM | POA: Diagnosis not present

## 2019-06-08 DIAGNOSIS — Z4889 Encounter for other specified surgical aftercare: Secondary | ICD-10-CM | POA: Diagnosis not present

## 2019-06-08 DIAGNOSIS — R079 Chest pain, unspecified: Secondary | ICD-10-CM | POA: Diagnosis not present

## 2019-06-08 DIAGNOSIS — R0902 Hypoxemia: Secondary | ICD-10-CM | POA: Diagnosis not present

## 2019-06-08 DIAGNOSIS — Z743 Need for continuous supervision: Secondary | ICD-10-CM | POA: Diagnosis not present

## 2019-06-08 DIAGNOSIS — D649 Anemia, unspecified: Secondary | ICD-10-CM | POA: Diagnosis not present

## 2019-06-08 DIAGNOSIS — R778 Other specified abnormalities of plasma proteins: Secondary | ICD-10-CM | POA: Diagnosis not present

## 2019-06-08 DIAGNOSIS — R41841 Cognitive communication deficit: Secondary | ICD-10-CM | POA: Diagnosis not present

## 2019-06-08 DIAGNOSIS — L89322 Pressure ulcer of left buttock, stage 2: Secondary | ICD-10-CM | POA: Diagnosis not present

## 2019-06-08 DIAGNOSIS — R2689 Other abnormalities of gait and mobility: Secondary | ICD-10-CM | POA: Diagnosis not present

## 2019-06-08 DIAGNOSIS — R2681 Unsteadiness on feet: Secondary | ICD-10-CM | POA: Diagnosis not present

## 2019-06-08 DIAGNOSIS — R3129 Other microscopic hematuria: Secondary | ICD-10-CM | POA: Diagnosis not present

## 2019-06-08 DIAGNOSIS — Z792 Long term (current) use of antibiotics: Secondary | ICD-10-CM | POA: Diagnosis not present

## 2019-06-08 DIAGNOSIS — I21A1 Myocardial infarction type 2: Secondary | ICD-10-CM | POA: Diagnosis not present

## 2019-06-08 DIAGNOSIS — E785 Hyperlipidemia, unspecified: Secondary | ICD-10-CM | POA: Diagnosis not present

## 2019-06-08 DIAGNOSIS — Z87891 Personal history of nicotine dependence: Secondary | ICD-10-CM | POA: Diagnosis not present

## 2019-06-08 DIAGNOSIS — Z7982 Long term (current) use of aspirin: Secondary | ICD-10-CM | POA: Diagnosis not present

## 2019-06-10 DIAGNOSIS — I2510 Atherosclerotic heart disease of native coronary artery without angina pectoris: Secondary | ICD-10-CM | POA: Diagnosis not present

## 2019-06-10 DIAGNOSIS — E441 Mild protein-calorie malnutrition: Secondary | ICD-10-CM | POA: Diagnosis not present

## 2019-06-10 DIAGNOSIS — F0390 Unspecified dementia without behavioral disturbance: Secondary | ICD-10-CM | POA: Diagnosis not present

## 2019-06-10 DIAGNOSIS — R262 Difficulty in walking, not elsewhere classified: Secondary | ICD-10-CM | POA: Diagnosis not present

## 2019-06-11 DIAGNOSIS — R3129 Other microscopic hematuria: Secondary | ICD-10-CM | POA: Diagnosis not present

## 2019-06-11 DIAGNOSIS — R0789 Other chest pain: Secondary | ICD-10-CM | POA: Diagnosis not present

## 2019-06-11 DIAGNOSIS — R000 Tachycardia, unspecified: Secondary | ICD-10-CM | POA: Diagnosis not present

## 2019-06-11 DIAGNOSIS — F0390 Unspecified dementia without behavioral disturbance: Secondary | ICD-10-CM | POA: Diagnosis not present

## 2019-06-11 DIAGNOSIS — I21A1 Myocardial infarction type 2: Secondary | ICD-10-CM | POA: Diagnosis not present

## 2019-06-11 DIAGNOSIS — R002 Palpitations: Secondary | ICD-10-CM | POA: Diagnosis not present

## 2019-06-11 DIAGNOSIS — D649 Anemia, unspecified: Secondary | ICD-10-CM | POA: Diagnosis not present

## 2019-06-11 DIAGNOSIS — I10 Essential (primary) hypertension: Secondary | ICD-10-CM | POA: Diagnosis not present

## 2019-06-12 DIAGNOSIS — R778 Other specified abnormalities of plasma proteins: Secondary | ICD-10-CM | POA: Diagnosis not present

## 2019-06-12 DIAGNOSIS — I214 Non-ST elevation (NSTEMI) myocardial infarction: Secondary | ICD-10-CM | POA: Diagnosis not present

## 2019-06-12 DIAGNOSIS — I10 Essential (primary) hypertension: Secondary | ICD-10-CM | POA: Diagnosis not present

## 2019-06-12 DIAGNOSIS — R000 Tachycardia, unspecified: Secondary | ICD-10-CM | POA: Diagnosis not present

## 2019-06-12 DIAGNOSIS — R079 Chest pain, unspecified: Secondary | ICD-10-CM | POA: Diagnosis not present

## 2019-06-12 DIAGNOSIS — F0390 Unspecified dementia without behavioral disturbance: Secondary | ICD-10-CM | POA: Diagnosis not present

## 2019-06-12 DIAGNOSIS — D649 Anemia, unspecified: Secondary | ICD-10-CM | POA: Diagnosis not present

## 2019-06-13 DIAGNOSIS — L89322 Pressure ulcer of left buttock, stage 2: Secondary | ICD-10-CM | POA: Diagnosis not present

## 2019-06-15 DIAGNOSIS — Z09 Encounter for follow-up examination after completed treatment for conditions other than malignant neoplasm: Secondary | ICD-10-CM | POA: Diagnosis not present

## 2019-06-16 ENCOUNTER — Ambulatory Visit (INDEPENDENT_AMBULATORY_CARE_PROVIDER_SITE_OTHER): Payer: Medicare Other

## 2019-06-16 DIAGNOSIS — R002 Palpitations: Secondary | ICD-10-CM

## 2019-06-20 DIAGNOSIS — L89322 Pressure ulcer of left buttock, stage 2: Secondary | ICD-10-CM | POA: Diagnosis not present

## 2019-06-21 ENCOUNTER — Other Ambulatory Visit: Payer: Self-pay | Admitting: *Deleted

## 2019-06-21 DIAGNOSIS — I10 Essential (primary) hypertension: Secondary | ICD-10-CM

## 2019-06-21 NOTE — Patient Outreach (Signed)
Screened for potential [facility] Care Management needs as a benefit of  [employer].  Mr. Wenzinger is currently receiving skilled therapy at [facility].   Writer attended  telephonic interdisciplinary team meeting to assess for disposition needs and transition plan for resident.   Facility reports member will transition to home this Friday with wife per family request.   Telephone call made to [place] wife/DPR [phone] to discuss [facility] services. Patient identifiers confirmed.  Peggy reports member will come home on this Friday. States they will have home with [facility].   Mrs. Munkres endorses she has long term care insurance that will pay for in home care. However, she will have to pay out of pocket for 90 days initially. States she has stopped paying on the long term care insurance because the prices kept going up. She is able to utilize services based on what she has already paid into the plan however.    Explained [facility] Management services. Mrs. Diangelo is agreeable. Writer will make [facility] referral for care coordination.  Mr. Texeira has medical history of HTN, dementia,  NSTEMI.   Member will transition to home on Friday, May 28th.    Marthenia Rolling, MSN-Ed, RN,BSN [place] Acute Care Coordinator [phone] [facility]) [phone]  (Toll free office)

## 2019-06-23 NOTE — Addendum Note (Signed)
Addended by: Harless Litten on: 06/23/2019 04:58 PM   Modules accepted: Orders

## 2019-06-24 DIAGNOSIS — E46 Unspecified protein-calorie malnutrition: Secondary | ICD-10-CM | POA: Diagnosis not present

## 2019-06-24 DIAGNOSIS — I10 Essential (primary) hypertension: Secondary | ICD-10-CM | POA: Diagnosis not present

## 2019-06-24 DIAGNOSIS — I2510 Atherosclerotic heart disease of native coronary artery without angina pectoris: Secondary | ICD-10-CM | POA: Diagnosis not present

## 2019-06-24 DIAGNOSIS — Z7982 Long term (current) use of aspirin: Secondary | ICD-10-CM | POA: Diagnosis not present

## 2019-06-24 DIAGNOSIS — I214 Non-ST elevation (NSTEMI) myocardial infarction: Secondary | ICD-10-CM | POA: Diagnosis not present

## 2019-06-24 DIAGNOSIS — Z9889 Other specified postprocedural states: Secondary | ICD-10-CM | POA: Diagnosis not present

## 2019-06-24 DIAGNOSIS — M810 Age-related osteoporosis without current pathological fracture: Secondary | ICD-10-CM | POA: Diagnosis not present

## 2019-06-24 DIAGNOSIS — F0390 Unspecified dementia without behavioral disturbance: Secondary | ICD-10-CM | POA: Diagnosis not present

## 2019-06-24 DIAGNOSIS — F329 Major depressive disorder, single episode, unspecified: Secondary | ICD-10-CM | POA: Diagnosis not present

## 2019-06-24 DIAGNOSIS — D649 Anemia, unspecified: Secondary | ICD-10-CM | POA: Diagnosis not present

## 2019-06-27 ENCOUNTER — Other Ambulatory Visit: Payer: Self-pay

## 2019-06-27 DIAGNOSIS — I10 Essential (primary) hypertension: Secondary | ICD-10-CM | POA: Diagnosis not present

## 2019-06-27 NOTE — Patient Outreach (Signed)
[facility]) Care Management  06/27/2019  Angie Favret 1932-07-14 [id number]   Transition of care: New referral received on 06/27/2019.  Discharged from [facility] on 06/23/2019.   Placed call to wife, Vickii Chafe, who is person of contact for patient.  Reviewed [facility] program with Tawni Carnes and explained the transition of care program.  Wife states patient is doing well. Reports follow up with MD today. Wife reports she knows the medications and reviewed medication with MD today. Reports home health was ordered but has not called yet. Offered to assist and wife states she can call them.  Wife reports she is not currently interested in the transition of care program but would like me to send a letter with my contact information. Wife reports she will call me back if she changes her mind.  PLAN: will close case and will mail successful outreach letter to wife and patient. Confirmed address.  Tomasa Rand, RN, BSN, CEN [facility] [employer] [phone]

## 2019-07-10 NOTE — Progress Notes (Signed)
Cardiology Office Note:    Date:  07/11/2019   ID:  Ralph West, DOB November 24, 1932, MRN [id number]  PCP:  Townsend Roger, MD  Cardiologist:  Shirlee More, MD    Referring MD: Townsend Roger, MD    ASSESSMENT:    1. Elevated troponin   2. Essential hypertension   3. Mixed hyperlipidemia    PLAN:    In order of problems listed above:  1. In the hospital he had what was interpreted as a type II myocardial infarction when he was quite ill with cholecystitis in the perioperative.Marland Kitchen  He was treated conservatively I reviewed the patient and his wife I think he needs an evaluation for CAD undergo myocardial perfusion study but I will set the threshold quite high for considering revascularization.  He will continue aspirin and is not sure how he is not taking his statin but will discontinue over-the-counter red yeast rice to put on a low-dose of breath a statin and his beta-blocker. 2. Worsened he is hypotensive blood pressure by me both arms 80/50 we will stop his ACE inhibitor his wife will check blood pressure daily contact us if systolics remain less than 100 or frequently greater than 140 3. Initiate statin therapy with elevated troponin type II MI   Next appointment: 6 weeks   Medication Adjustments/Labs and Tests Ordered: Current medicines are reviewed at length with the patient today.  Concerns regarding medicines are outlined above.  No orders of the defined types were placed in this encounter.  No orders of the defined types were placed in this encounter.   Chief Complaint  Patient presents with  . Hospitalization Follow-up    History of Present Illness:    Ralph West is a 86 y.o. male with a hx of type II myocardial infarction last seen in consultation at [facility].  He was seen by me at [facility] in consultation 06/12/2019.  He had a recent cholecystectomy was at skilled nursing and the staff noted rapid heart rate of 150 bpm hypertensive and had chest pain.   He presented to the hospital but he was in sinus rhythm.  He had a low level troponin elevation is felt to represent demand ischemia or type II MI related to his cholecystectomy.  Echocardiogram showed ejection fraction of 55% he was admitted treated medically and discharged with the intent to follow-up for an outpatient heart rhythm monitor.  His troponin to her admission for cholecystectomy 06/04/2019 to 06/06/2019 showed a troponin peak of 2.21.  The troponin when I saw the patient was static in the range of 0.1-0.16.  A type II myocardial infarction was felt to have occurred in the perioperative period.  Other labs performed included a normal CBC renal function was normal GFR greater than 60 cc potassium 3.6.  His EKG 06/11/2019 independently reviewed shows sinus rhythm nonspecific ST and T abnormality.  Compliance with diet, lifestyle and medications: Yes  Is seen along with his wife she participates in evaluation decision making and is really the one who coordinates his care.  He went to collapse rehab 2 weeks improved and is back to his previous level of function she supervises medications he takes Aricept.  Overall he is done well but yesterday when PT is at the home his blood pressure was 409 systolic.  Recently had a change in medications atenolol chlorthalidone was discontinued put on metoprolol and a higher dose of lisinopril.  Fortunately has had no lightheadedness or syncope he has had no chest  pain shortness of breath recurrent palpitation.  Past Medical History:  Diagnosis Date  . Anxiety, generalized   . Diabetes mellitus without complication ([place])   . Hernia, umbilical   . Hyperlipidemia   . Hypertension   . Kidney stones     Past Surgical History:  Procedure Laterality Date  . CHOLECYSTECTOMY    . INGUINAL HERNIA REPAIR    . KIDNEY STONE SURGERY      Current Medications: Current Meds  Medication Sig  . aspirin EC 81 MG tablet Take 81 mg by mouth daily.  Marland Kitchen donepezil  (ARICEPT) 10 MG tablet Take 10 mg by mouth daily.  Marland Kitchen lisinopril (ZESTRIL) 20 MG tablet Take 20 mg by mouth daily.  . metFORMIN (GLUCOPHAGE) 500 MG tablet Take 250 mg by mouth 2 (two) times daily with a meal.   . metoprolol tartrate (LOPRESSOR) 25 MG tablet Take 25 mg by mouth 2 (two) times daily.  . multivitamin-iron-minerals-folic acid (CENTRUM) chewable tablet Chew 1 tablet by mouth daily.  . Red Yeast Rice 600 MG CAPS Take by mouth.  . rosuvastatin (CRESTOR) 5 MG tablet Take 5 mg by mouth daily.  . saw palmetto 160 MG capsule Take 160 mg by mouth 2 (two) times daily.  . sertraline (ZOLOFT) 25 MG tablet Take 25 mg by mouth daily.     Allergies:   Patient has no known allergies.   Social History   Socioeconomic History  . Marital status: Married    Spouse name: Not on file  . Number of children: Not on file  . Years of education: Not on file  . Highest education level: Not on file  Occupational History  . Not on file  Tobacco Use  . Smoking status: Former Research scientist (life sciences)  . Smokeless tobacco: Never Used  Substance and Sexual Activity  . Alcohol use: Not Currently  . Drug use: Not on file  . Sexual activity: Not on file  Other Topics Concern  . Not on file  Social History Narrative   Pt lives at home with his wife. He has 3 children. Pt completed grade school.   Social Determinants of Health   Financial Resource Strain:   . Difficulty of Paying Living Expenses:   Food Insecurity:   . Worried About Charity fundraiser in the Last Year:   . Arboriculturist in the Last Year:   Transportation Needs:   . Film/video editor (Medical):   Marland Kitchen Lack of Transportation (Non-Medical):   Physical Activity:   . Days of Exercise per Week:   . Minutes of Exercise per Session:   Stress:   . Feeling of Stress :   Social Connections:   . Frequency of Communication with Friends and Family:   . Frequency of Social Gatherings with Friends and Family:   . Attends Religious Services:   . Active  Member of Clubs or Organizations:   . Attends Archivist Meetings:   Marland Kitchen Marital Status:      Family History: The patient's family history includes Alzheimer's disease in his sister; Cancer in his brother. ROS:   Please see the history of present illness.    All other systems reviewed and are negative.  EKGs/Labs/Other Studies Reviewed:    The following studies were reviewed today:  EKG:  EKG ordered today and personally reviewed.  The ekg ordered today demonstrates sinus rhythm normal  Recent Labs: No results found for requested labs within last 8760 hours.  Recent Lipid Panel No  results found for: CHOL, TRIG, HDL, CHOLHDL, VLDL, LDLCALC, LDLDIRECT  Physical Exam:    VS:  BP (!) 80/52 (BP Location: Right Arm, Patient Position: Sitting, Cuff Size: Normal)   Pulse 85   Ht 5' 5.5" (1.664 m)   Wt 139 lb (63 kg)   SpO2 97%   BMI 22.78 kg/m     Wt Readings from Last 3 Encounters:  07/11/19 139 lb (63 kg)  10/20/17 146 lb (66.2 kg)     GEN: Frail forgetful well nourished, well developed in no acute distress HEENT: Normal NECK: No JVD; No carotid bruits LYMPHATICS: No lymphadenopathy CARDIAC: RRR, no murmurs, rubs, gallops RESPIRATORY:  Clear to auscultation without rales, wheezing or rhonchi  ABDOMEN: Soft, non-tender, non-distended MUSCULOSKELETAL:  No edema; No deformity  SKIN: Warm and dry NEUROLOGIC:  Alert and oriented x 3 PSYCHIATRIC:  Normal affect    Signed, Shirlee More, MD  07/11/2019 3:13 PM    [facility]

## 2019-07-11 ENCOUNTER — Other Ambulatory Visit: Payer: Self-pay

## 2019-07-11 ENCOUNTER — Ambulatory Visit (INDEPENDENT_AMBULATORY_CARE_PROVIDER_SITE_OTHER): Payer: Medicare Other | Admitting: Cardiology

## 2019-07-11 ENCOUNTER — Encounter: Payer: Self-pay | Admitting: Cardiology

## 2019-07-11 VITALS — BP 80/52 | HR 85 | Ht 65.5 in | Wt 139.0 lb

## 2019-07-11 DIAGNOSIS — I209 Angina pectoris, unspecified: Secondary | ICD-10-CM | POA: Diagnosis not present

## 2019-07-11 DIAGNOSIS — I10 Essential (primary) hypertension: Secondary | ICD-10-CM | POA: Diagnosis not present

## 2019-07-11 DIAGNOSIS — E782 Mixed hyperlipidemia: Secondary | ICD-10-CM

## 2019-07-11 DIAGNOSIS — R778 Other specified abnormalities of plasma proteins: Secondary | ICD-10-CM

## 2019-07-11 MED ORDER — PRAVASTATIN SODIUM 20 MG PO TABS
20.0000 mg | ORAL_TABLET | Freq: Every evening | ORAL | 3 refills | Status: DC
Start: 2019-07-11 — End: 2019-08-24

## 2019-07-11 NOTE — Patient Instructions (Signed)
Medication Instructions:  Your physician has recommended you make the following change in your medication:  STOP: Lisinopril STOP: Red Yeast Rice START: Pravastatin 20 mg take one tablet by mouth daily at bedtime.  *If you need a refill on your cardiac medications before your next appointment, please call your pharmacy*   Lab Work: None If you have labs (blood work) drawn today and your tests are completely normal, you will receive your results only by: Marland Kitchen MyChart Message (if you have MyChart) OR . A paper copy in the mail If you have any lab test that is abnormal or we need to change your treatment, we will call you to review the results.   Testing/Procedures:   [facility] [address] [place], [place] [address] Phone:  [phone]    Please arrive 15 minutes prior to your appointment time for registration and insurance purposes.  The test will take approximately 3 to 4 hours to complete; you may bring reading material.  If someone comes with you to your appointment, they will need to remain in the main lobby due to limited space in the testing area. **If you are pregnant or breastfeeding, please notify the nuclear lab prior to your appointment**  How to prepare for your Myocardial Perfusion Test: . Do not eat or drink 3 hours prior to your test, except you may have water. . Do not consume products containing caffeine (regular or decaffeinated) 12 hours prior to your test. (ex: coffee, chocolate, sodas, tea). . Do bring a list of your current medications with you.  If not listed below, you may take your medications as normal. . Take half of long acting insulin the night before test: Metformin . Do wear comfortable clothes (no dresses or overalls) and walking shoes, tennis shoes preferred (No heels or open toe shoes are allowed). . Do NOT wear cologne, perfume, aftershave, or lotions (deodorant is allowed). . If these instructions are not followed,  your test will have to be rescheduled.  Please report to [address] for your test.  If you have questions or concerns about your appointment, you can call the [facility] at [phone].  If you cannot keep your appointment, please provide 24 hours notification to the Nuclear Lab, to avoid a possible $50 charge to your account.    Follow-Up: At [facility], you and your health needs are our priority.  As part of our continuing mission to provide you with exceptional heart care, we have created designated Provider Care Teams.  These Care Teams include your primary Cardiologist (physician) and Advanced Practice Providers (APPs -  Physician Assistants and Nurse Practitioners) who all work together to provide you with the care you need, when you need it.  We recommend signing up for the patient portal called "MyChart".  Sign up information is provided on this After Visit Summary.  MyChart is used to connect with patients for Virtual Visits (Telemedicine).  Patients are able to view lab/test results, encounter notes, upcoming appointments, etc.  Non-urgent messages can be sent to your provider as well.   To learn more about what you can do with MyChart, go to [email or web address].    Your next appointment:   6 week(s)  The format for your next appointment:   In Person  Provider:   Shirlee More, MD   Other Instructions

## 2019-07-12 ENCOUNTER — Telehealth (HOSPITAL_COMMUNITY): Payer: Self-pay | Admitting: Radiology

## 2019-07-12 NOTE — Telephone Encounter (Signed)
Patient given detailed instructions per Myocardial Perfusion Study Information Sheet for the test on 07/20/2019 at 7:45. Patient notified to arrive 15 minutes early and that it is imperative to arrive on time for appointment to keep from having the test rescheduled.  If you need to cancel or reschedule your appointment, please call the office within 24 hours of your appointment. . Patient verbalized understanding.EHK

## 2019-07-19 ENCOUNTER — Telehealth: Payer: Self-pay | Admitting: *Deleted

## 2019-07-19 DIAGNOSIS — R002 Palpitations: Secondary | ICD-10-CM | POA: Diagnosis not present

## 2019-07-19 NOTE — Telephone Encounter (Signed)
Tried calling patient. No answer and no voicemail set up for me to leave a message. 

## 2019-07-19 NOTE — Telephone Encounter (Signed)
Home Health nurse called with questions regarding medication changes made on last visit. Pt has appt for myocardial perfusion tomorrow 6/24. Pharmacist told pt there was no problem with the Red Yeast Rice so pt has continued taking it, also Pravastatin was added to med list but pt is also taking Rosuvastatin, does he need to continue this? Please address with pt when comes in for testing.

## 2019-07-20 ENCOUNTER — Other Ambulatory Visit: Payer: Self-pay

## 2019-07-20 ENCOUNTER — Ambulatory Visit (INDEPENDENT_AMBULATORY_CARE_PROVIDER_SITE_OTHER): Payer: Medicare Other

## 2019-07-20 DIAGNOSIS — I10 Essential (primary) hypertension: Secondary | ICD-10-CM | POA: Diagnosis not present

## 2019-07-20 DIAGNOSIS — E782 Mixed hyperlipidemia: Secondary | ICD-10-CM

## 2019-07-20 DIAGNOSIS — I209 Angina pectoris, unspecified: Secondary | ICD-10-CM

## 2019-07-20 DIAGNOSIS — R778 Other specified abnormalities of plasma proteins: Secondary | ICD-10-CM | POA: Diagnosis not present

## 2019-07-20 LAB — MYOCARDIAL PERFUSION IMAGING
LV dias vol: 49 mL (ref 62–150)
LV sys vol: 23 mL
Peak HR: 116 {beats}/min
Rest HR: 80 {beats}/min
SDS: 8
SRS: 6
SSS: 14
TID: 1.05

## 2019-07-20 MED ORDER — REGADENOSON 0.4 MG/5ML IV SOLN
0.4000 mg | Freq: Once | INTRAVENOUS | Status: AC
  Administered 2019-07-20: 0.4 mg via INTRAVENOUS

## 2019-07-20 MED ORDER — TECHNETIUM TC 99M TETROFOSMIN IV KIT
11.0000 | PACK | Freq: Once | INTRAVENOUS | Status: AC | PRN
Start: 2019-07-20 — End: 2019-07-20
  Administered 2019-07-20: 11 via INTRAVENOUS

## 2019-07-20 MED ORDER — TECHNETIUM TC 99M TETROFOSMIN IV KIT
32.1000 | PACK | Freq: Once | INTRAVENOUS | Status: AC | PRN
  Administered 2019-07-20: 32.1 via INTRAVENOUS

## 2019-07-20 NOTE — Telephone Encounter (Signed)
Needs to stop the red yeast rice as you can get toxicity combining with a statin

## 2019-07-20 NOTE — Telephone Encounter (Signed)
Spoke to the patient and the patients wife just now in the office. She let me know that the patient is currently taking both pravastatin 20 mg and rosuvastatin 5 mg. They are wondering which of these medications the patient should be taking?  They also are going to continue with the patients red yeast rice supplement as they said that they spoke with the pharmacist and were told that it was fine for him to continue taking it.   I will route to Dr. Bettina Gavia for further advise.

## 2019-07-20 NOTE — Telephone Encounter (Signed)
Now discontinue pravastatin

## 2019-07-20 NOTE — Telephone Encounter (Signed)
Tried calling patient. No answer and no voicemail set up for me to leave a message. 

## 2019-07-21 ENCOUNTER — Other Ambulatory Visit: Payer: Self-pay | Admitting: Cardiology

## 2019-07-21 ENCOUNTER — Telehealth: Payer: Self-pay

## 2019-07-21 DIAGNOSIS — R002 Palpitations: Secondary | ICD-10-CM

## 2019-07-21 NOTE — Telephone Encounter (Signed)
Spoke with patients wife regarding results and recommendation. ° °She verbalizes understanding and is agreeable to plan of care. Advised patient to call back with any issues or concerns.  °

## 2019-07-21 NOTE — Telephone Encounter (Signed)
Spoke with patients wife just now and let her know that Dr. Bettina Gavia wants Ralph West to stop his pravastatin and the red yeast rice. She verbalizes understanding and states that she will help him to do this.   No other issues or concerns were noted at this time.    Encouraged patient to call back with any questions or concerns.

## 2019-07-21 NOTE — Telephone Encounter (Signed)
Tried calling patient. No answer and no voicemail set up for me to leave a message. 

## 2019-07-21 NOTE — Telephone Encounter (Signed)
-----   Message from Richardo Priest, MD sent at 07/20/2019  5:32 PM EDT ----- Normal or stable result  This is a good results and I do not think he needs procedures like heart catheterization and will do well with medication

## 2019-07-24 ENCOUNTER — Telehealth: Payer: Self-pay

## 2019-07-24 DIAGNOSIS — Z7982 Long term (current) use of aspirin: Secondary | ICD-10-CM | POA: Diagnosis not present

## 2019-07-24 DIAGNOSIS — I2510 Atherosclerotic heart disease of native coronary artery without angina pectoris: Secondary | ICD-10-CM | POA: Diagnosis not present

## 2019-07-24 DIAGNOSIS — M810 Age-related osteoporosis without current pathological fracture: Secondary | ICD-10-CM | POA: Diagnosis not present

## 2019-07-24 DIAGNOSIS — F0390 Unspecified dementia without behavioral disturbance: Secondary | ICD-10-CM | POA: Diagnosis not present

## 2019-07-24 DIAGNOSIS — I214 Non-ST elevation (NSTEMI) myocardial infarction: Secondary | ICD-10-CM | POA: Diagnosis not present

## 2019-07-24 DIAGNOSIS — I10 Essential (primary) hypertension: Secondary | ICD-10-CM | POA: Diagnosis not present

## 2019-07-24 DIAGNOSIS — Z9889 Other specified postprocedural states: Secondary | ICD-10-CM | POA: Diagnosis not present

## 2019-07-24 DIAGNOSIS — F329 Major depressive disorder, single episode, unspecified: Secondary | ICD-10-CM | POA: Diagnosis not present

## 2019-07-24 DIAGNOSIS — D649 Anemia, unspecified: Secondary | ICD-10-CM | POA: Diagnosis not present

## 2019-07-24 DIAGNOSIS — E46 Unspecified protein-calorie malnutrition: Secondary | ICD-10-CM | POA: Diagnosis not present

## 2019-07-24 NOTE — Telephone Encounter (Signed)
Spoke with patients wife regarding results and recommendation. ° °She verbalizes understanding and is agreeable to plan of care. Advised for patient to call back with any issues or concerns.  °

## 2019-07-24 NOTE — Telephone Encounter (Signed)
-----   Message from Richardo Priest, MD sent at 07/22/2019 12:43 PM EDT ----- Normal or stable result  This is a good result no episodes of atrial fibrillation no change in medications please communicate with his wife as he depends upon her

## 2019-08-03 DIAGNOSIS — I214 Non-ST elevation (NSTEMI) myocardial infarction: Secondary | ICD-10-CM | POA: Diagnosis not present

## 2019-08-03 DIAGNOSIS — F329 Major depressive disorder, single episode, unspecified: Secondary | ICD-10-CM | POA: Diagnosis not present

## 2019-08-03 DIAGNOSIS — M810 Age-related osteoporosis without current pathological fracture: Secondary | ICD-10-CM | POA: Diagnosis not present

## 2019-08-03 DIAGNOSIS — I10 Essential (primary) hypertension: Secondary | ICD-10-CM | POA: Diagnosis not present

## 2019-08-03 DIAGNOSIS — F0390 Unspecified dementia without behavioral disturbance: Secondary | ICD-10-CM | POA: Diagnosis not present

## 2019-08-03 DIAGNOSIS — I2510 Atherosclerotic heart disease of native coronary artery without angina pectoris: Secondary | ICD-10-CM | POA: Diagnosis not present

## 2019-08-11 DIAGNOSIS — M6281 Muscle weakness (generalized): Secondary | ICD-10-CM | POA: Diagnosis not present

## 2019-08-11 DIAGNOSIS — R2689 Other abnormalities of gait and mobility: Secondary | ICD-10-CM | POA: Diagnosis not present

## 2019-08-16 DIAGNOSIS — R2689 Other abnormalities of gait and mobility: Secondary | ICD-10-CM | POA: Diagnosis not present

## 2019-08-16 DIAGNOSIS — M6281 Muscle weakness (generalized): Secondary | ICD-10-CM | POA: Diagnosis not present

## 2019-08-22 DIAGNOSIS — R2689 Other abnormalities of gait and mobility: Secondary | ICD-10-CM | POA: Diagnosis not present

## 2019-08-22 DIAGNOSIS — M6281 Muscle weakness (generalized): Secondary | ICD-10-CM | POA: Diagnosis not present

## 2019-08-24 ENCOUNTER — Other Ambulatory Visit: Payer: Self-pay | Admitting: Cardiology

## 2019-08-24 ENCOUNTER — Ambulatory Visit (INDEPENDENT_AMBULATORY_CARE_PROVIDER_SITE_OTHER): Payer: Medicare Other | Admitting: Cardiology

## 2019-08-24 ENCOUNTER — Other Ambulatory Visit: Payer: Self-pay

## 2019-08-24 ENCOUNTER — Encounter: Payer: Self-pay | Admitting: Cardiology

## 2019-08-24 VITALS — BP 102/58 | HR 68 | Ht 65.0 in | Wt 138.6 lb

## 2019-08-24 DIAGNOSIS — I10 Essential (primary) hypertension: Secondary | ICD-10-CM | POA: Diagnosis not present

## 2019-08-24 DIAGNOSIS — E782 Mixed hyperlipidemia: Secondary | ICD-10-CM | POA: Diagnosis not present

## 2019-08-24 DIAGNOSIS — I249 Acute ischemic heart disease, unspecified: Secondary | ICD-10-CM | POA: Diagnosis not present

## 2019-08-24 DIAGNOSIS — R778 Other specified abnormalities of plasma proteins: Secondary | ICD-10-CM

## 2019-08-24 DIAGNOSIS — R002 Palpitations: Secondary | ICD-10-CM

## 2019-08-24 NOTE — Patient Instructions (Signed)
Medication Instructions:  Your physician recommends that you continue on your current medications as directed. Please refer to the Current Medication list given to you today.  *If you need a refill on your cardiac medications before your next appointment, please call your pharmacy*   Lab Work: Your physician recommends that you return for lab work in: [place], Lipids If you have labs (blood work) drawn today and your tests are completely normal, you will receive your results only by: Marland Kitchen MyChart Message (if you have MyChart) OR . A paper copy in the mail If you have any lab test that is abnormal or we need to change your treatment, we will call you to review the results.   Testing/Procedures:   [facility] [address] [place], [place] [address] Phone:  [phone]    Please arrive 15 minutes prior to your appointment time for registration and insurance purposes.  The test will take approximately 3 to 4 hours to complete; you may bring reading material.  If someone comes with you to your appointment, they will need to remain in the main lobby due to limited space in the testing area. **If you are pregnant or breastfeeding, please notify the nuclear lab prior to your appointment**  How to prepare for your Myocardial Perfusion Test: . Do not eat or drink 3 hours prior to your test, except you may have water. . Do not consume products containing caffeine (regular or decaffeinated) 12 hours prior to your test. (ex: coffee, chocolate, sodas, tea). . Do bring a list of your current medications with you.  If not listed below, you may take your medications as normal. . Do wear comfortable clothes (no dresses or overalls) and walking shoes, tennis shoes preferred (No heels or open toe shoes are allowed). . Do NOT wear cologne, perfume, aftershave, or lotions (deodorant is allowed). . If these instructions are not followed, your test will have to be  rescheduled.  Please report to [address] for your test.  If you have questions or concerns about your appointment, you can call the [facility] at [phone].  If you cannot keep your appointment, please provide 24 hours notification to the Nuclear Lab, to avoid a possible $50 charge to your account.    Follow-Up: At [facility], you and your health needs are our priority.  As part of our continuing mission to provide you with exceptional heart care, we have created designated Provider Care Teams.  These Care Teams include your primary Cardiologist (physician) and Advanced Practice Providers (APPs -  Physician Assistants and Nurse Practitioners) who all work together to provide you with the care you need, when you need it.  We recommend signing up for the patient portal called "MyChart".  Sign up information is provided on this After Visit Summary.  MyChart is used to connect with patients for Virtual Visits (Telemedicine).  Patients are able to view lab/test results, encounter notes, upcoming appointments, etc.  Non-urgent messages can be sent to your provider as well.   To learn more about what you can do with MyChart, go to [email or web address].    Your next appointment:   3 month(s)  The format for your next appointment:   In Person  Provider:   Shirlee More, MD   Other Instructions

## 2019-08-24 NOTE — Addendum Note (Signed)
Addended by: Resa Miner I on: 08/24/2019 11:27 AM   Modules accepted: Orders

## 2019-08-24 NOTE — Progress Notes (Signed)
Cardiology Office Note:    Date:  08/24/2019   ID:  Ralph West, DOB June 27, 1932, MRN [id number]  PCP:  Ralph Roger, MD  Cardiologist:  Ralph More, MD    Referring MD: Ralph Roger, MD    ASSESSMENT:    1. Elevated troponin   2. Essential hypertension   3. Mixed hyperlipidemia   4. Palpitations    PLAN:    In order of problems listed above:  1. He had evidence of type II myocardial infarction after gallbladder surgery is quite debilitated and weak afterwards and has made a nice recovery.  He is having ongoing symptoms of ischemia if continue medical therapy and undergo risk assessment with myocardial perfusion test and if abnormal consider cardiac CTA.  His wife will continue to give him nitroglycerin as needed.  The pattern appears stable 2. BP at target continue his beta-blocker 3. Well-tolerated without muscle symptoms continue his high intensity statin check liver function lipid profile goal LDL less than 70 4. No recurrence I reviewed the event monitor with the patient his wife will continue his beta-blocker   Next appointment: 3 months   Medication Adjustments/Labs and Tests Ordered: Current medicines are reviewed at length with the patient today.  Concerns regarding medicines are outlined above.  No orders of the defined types were placed in this encounter.  No orders of the defined types were placed in this encounter.   Chief Complaint  Patient presents with  . Follow-up    After event monitor and type II myocardial infarction    History of Present Illness:    Ralph West is a 86 y.o. male with a hx of  type II myocardial infarction last seen in consultation at [facility].  He was seen by me at [facility] in consultation 06/12/2019.  He had a recent cholecystectomy was at skilled nursing and the staff noted rapid heart rate of 150 bpm hypertensive and had chest pain.  He presented to the hospital but he was in sinus rhythm.  He had a low level  troponin elevation is felt to represent demand ischemia or type II MI related to his cholecystectomy.  Echocardiogram showed ejection fraction of 55% he was admitted treated medically and discharged with the intent to follow-up for an outpatient heart rhythm monitor.  His troponin during  admission for cholecystectomy 06/04/2019 to 06/06/2019 showed a troponin peak of 2.21.  The troponin when I saw the patient was static in the range of 0.1-0.16.  A type II myocardial infarction was felt to have occurred in the perioperative period  He was last seen 07/11/2019 with symptomatic hypotension and his ACE inhibitor was discontinued and he is initiated on prescription statin having been taking over-the-counter red rice yeast.. His event monitor resulted 07/22/2019 showed rare ventricular and supraventricular arrhythmia and no episodes of atrial fibrillation or flutter Compliance with diet, lifestyle and medications: Yes  He is seen in the office with his wife today he really has made a marked improvement he is back to his usual level of health tolerates his medications and has had no problem with the statin with mild with muscle pain or weakness.  His wife says at times he gets what she calls spells where he seems to be in distress will hold his hand to his chest and his blood pressure will be elevated in the range of 170 his heart rate greater than 100 bpm and should give him nitroglycerin with relief.  Last one occurred 3 to  4 weeks ago.  He does not remember.  With ongoing symptoms what sounds like ischemia and type II myocardial infarction after discussion with the patient and his wife they like to undergo an ischemia evaluation and will do a myocardial perfusion study in the office.  If abnormal I would favor cardiac CTA to risk assess.  Continue medical therapy including aspirin beta-blocker statin and nitroglycerin as needed.  He now takes a high intensity statin I will recheck his liver function and lipid  profile today.  I will plan to see him in 3 months or sooner if he has a high risk perfusion test Past Medical History:  Diagnosis Date  . Anxiety, generalized   . Diabetes mellitus without complication ([place])   . Hernia, umbilical   . Hyperlipidemia   . Hypertension   . Kidney stones     Past Surgical History:  Procedure Laterality Date  . CHOLECYSTECTOMY    . INGUINAL HERNIA REPAIR    . KIDNEY STONE SURGERY      Current Medications: Current Meds  Medication Sig  . aspirin EC 81 MG tablet Take 81 mg by mouth daily.  . Calcium Carbonate-Vitamin D (OYSTER SHELL CALCIUM/D PO) Take 1 tablet by mouth in the morning and at bedtime.  . donepezil (ARICEPT) 10 MG tablet Take 10 mg by mouth daily.  . metoprolol tartrate (LOPRESSOR) 25 MG tablet Take 25 mg by mouth 2 (two) times daily.  . multivitamin-iron-minerals-folic acid (CENTRUM) chewable tablet Chew 1 tablet by mouth daily.  . rosuvastatin (CRESTOR) 5 MG tablet Take 5 mg by mouth daily.  . saw palmetto 160 MG capsule Take 160 mg by mouth daily.   . sertraline (ZOLOFT) 25 MG tablet Take 25 mg by mouth daily.     Allergies:   Patient has no known allergies.   Social History   Socioeconomic History  . Marital status: Married    Spouse name: Not on file  . Number of children: Not on file  . Years of education: Not on file  . Highest education level: Not on file  Occupational History  . Not on file  Tobacco Use  . Smoking status: Former Research scientist (life sciences)  . Smokeless tobacco: Never Used  Substance and Sexual Activity  . Alcohol use: Not Currently  . Drug use: Not on file  . Sexual activity: Not on file  Other Topics Concern  . Not on file  Social History Narrative   Pt lives at home with his wife. He has 3 children. Pt completed grade school.   Social Determinants of Health   Financial Resource Strain:   . Difficulty of Paying Living Expenses:   Food Insecurity:   . Worried About Charity fundraiser in the Last Year:   . Arts development officer in the Last Year:   Transportation Needs:   . Film/video editor (Medical):   Marland Kitchen Lack of Transportation (Non-Medical):   Physical Activity:   . Days of Exercise per Week:   . Minutes of Exercise per Session:   Stress:   . Feeling of Stress :   Social Connections:   . Frequency of Communication with Friends and Family:   . Frequency of Social Gatherings with Friends and Family:   . Attends Religious Services:   . Active Member of Clubs or Organizations:   . Attends Archivist Meetings:   Marland Kitchen Marital Status:      Family History: The patient's family history includes Alzheimer's disease in his  sister; Cancer in his brother. ROS:   Please see the history of present illness.    All other systems reviewed and are negative.  EKGs/Labs/Other Studies Reviewed:    The following studies were reviewed today:    Recent Labs: 10/19/2018 cholesterol 175 HDL 49 LDL 100 06/01/2019 creatinine 1.13  Physical Exam:    VS:  BP (!) 102/58   Pulse 68   Ht 5\' 5"  (1.651 m)   Wt 138 lb 9.6 oz (62.9 kg)   SpO2 95%   BMI 23.06 kg/m     Wt Readings from Last 3 Encounters:  08/24/19 138 lb 9.6 oz (62.9 kg)  07/20/19 139 lb (63 kg)  07/11/19 139 lb (63 kg)     GEN:  Well nourished, well developed in no acute distress he no longer looks frail HEENT: Normal NECK: No JVD; No carotid bruits LYMPHATICS: No lymphadenopathy CARDIAC: RRR, no murmurs, rubs, gallops RESPIRATORY:  Clear to auscultation without rales, wheezing or rhonchi  ABDOMEN: Soft, non-tender, non-distended MUSCULOSKELETAL:  No edema; No deformity  SKIN: Warm and dry NEUROLOGIC:  Alert and oriented x 3 PSYCHIATRIC:  Normal affect    Signed, Ralph More, MD  08/24/2019 11:18 AM    [place]

## 2019-08-25 LAB — COMPREHENSIVE METABOLIC PANEL
ALT: 28 IU/L (ref 0–44)
AST: 26 IU/L (ref 0–40)
Albumin/Globulin Ratio: 2.3 — ABNORMAL HIGH (ref 1.2–2.2)
Albumin: 4.3 g/dL (ref 3.6–4.6)
Alkaline Phosphatase: 91 IU/L (ref 48–121)
BUN/Creatinine Ratio: 15 (ref 10–24)
BUN: 16 mg/dL (ref 8–27)
Bilirubin Total: 1 mg/dL (ref 0.0–1.2)
CO2: 26 mmol/L (ref 20–29)
Calcium: 9.7 mg/dL (ref 8.6–10.2)
Chloride: 102 mmol/L (ref 96–106)
Creatinine, Ser: 1.07 mg/dL (ref 0.76–1.27)
GFR calc Af Amer: 73 mL/min/{1.73_m2} (ref >59)
GFR calc non Af Amer: 63 mL/min/{1.73_m2} (ref >59)
Globulin, Total: 1.9 g/dL (ref 1.5–4.5)
Glucose: 141 mg/dL — ABNORMAL HIGH (ref 65–99)
Potassium: 4.3 mmol/L (ref 3.5–5.2)
Sodium: 144 mmol/L (ref 134–144)
Total Protein: 6.2 g/dL (ref 6.0–8.5)

## 2019-08-25 LAB — LIPID PANEL
Chol/HDL Ratio: 2.9 ratio (ref 0.0–5.0)
Cholesterol, Total: 137 mg/dL (ref 100–199)
HDL: 47 mg/dL (ref >39)
LDL Chol Calc (NIH): 51 mg/dL (ref 0–99)
Triglycerides: 249 mg/dL — ABNORMAL HIGH (ref 0–149)
VLDL Cholesterol Cal: 39 mg/dL (ref 5–40)

## 2019-08-29 ENCOUNTER — Telehealth: Payer: Self-pay

## 2019-08-29 NOTE — Telephone Encounter (Signed)
Spoke with patients wife regarding results and recommendation. ° °She verbalizes understanding and is agreeable to plan of care. Advised patient to call back with any issues or concerns.  °

## 2019-08-29 NOTE — Telephone Encounter (Signed)
Patient's wife returning call. 

## 2019-08-29 NOTE — Telephone Encounter (Signed)
Left message on patients voicemail to please return our call.   

## 2019-08-29 NOTE — Telephone Encounter (Signed)
-----   Message from Richardo Priest, MD sent at 08/29/2019  7:27 AM EDT ----- Normal or stable result  No changes

## 2019-08-30 DIAGNOSIS — M6281 Muscle weakness (generalized): Secondary | ICD-10-CM | POA: Diagnosis not present

## 2019-08-30 DIAGNOSIS — R2689 Other abnormalities of gait and mobility: Secondary | ICD-10-CM | POA: Diagnosis not present

## 2019-09-04 DIAGNOSIS — M6281 Muscle weakness (generalized): Secondary | ICD-10-CM | POA: Diagnosis not present

## 2019-09-04 DIAGNOSIS — R2689 Other abnormalities of gait and mobility: Secondary | ICD-10-CM | POA: Diagnosis not present

## 2019-09-12 DIAGNOSIS — M6281 Muscle weakness (generalized): Secondary | ICD-10-CM | POA: Diagnosis not present

## 2019-09-12 DIAGNOSIS — R2689 Other abnormalities of gait and mobility: Secondary | ICD-10-CM | POA: Diagnosis not present

## 2019-09-18 DIAGNOSIS — R2689 Other abnormalities of gait and mobility: Secondary | ICD-10-CM | POA: Diagnosis not present

## 2019-09-18 DIAGNOSIS — M6281 Muscle weakness (generalized): Secondary | ICD-10-CM | POA: Diagnosis not present

## 2019-09-25 DIAGNOSIS — M6281 Muscle weakness (generalized): Secondary | ICD-10-CM | POA: Diagnosis not present

## 2019-09-25 DIAGNOSIS — R2689 Other abnormalities of gait and mobility: Secondary | ICD-10-CM | POA: Diagnosis not present

## 2019-10-03 DIAGNOSIS — M6281 Muscle weakness (generalized): Secondary | ICD-10-CM | POA: Diagnosis not present

## 2019-10-03 DIAGNOSIS — R2689 Other abnormalities of gait and mobility: Secondary | ICD-10-CM | POA: Diagnosis not present

## 2019-11-01 ENCOUNTER — Encounter: Payer: Self-pay | Admitting: Cardiology

## 2019-11-03 DIAGNOSIS — E7800 Pure hypercholesterolemia, unspecified: Secondary | ICD-10-CM | POA: Diagnosis not present

## 2019-11-03 DIAGNOSIS — I2510 Atherosclerotic heart disease of native coronary artery without angina pectoris: Secondary | ICD-10-CM | POA: Diagnosis not present

## 2019-11-03 DIAGNOSIS — N400 Enlarged prostate without lower urinary tract symptoms: Secondary | ICD-10-CM | POA: Diagnosis not present

## 2019-11-03 DIAGNOSIS — Z6823 Body mass index (BMI) 23.0-23.9, adult: Secondary | ICD-10-CM | POA: Diagnosis not present

## 2019-11-03 DIAGNOSIS — Z0000 Encounter for general adult medical examination without abnormal findings: Secondary | ICD-10-CM | POA: Diagnosis not present

## 2019-11-03 DIAGNOSIS — Z23 Encounter for immunization: Secondary | ICD-10-CM | POA: Diagnosis not present

## 2019-11-03 DIAGNOSIS — I10 Essential (primary) hypertension: Secondary | ICD-10-CM | POA: Diagnosis not present

## 2019-11-03 DIAGNOSIS — E119 Type 2 diabetes mellitus without complications: Secondary | ICD-10-CM | POA: Diagnosis not present

## 2019-11-03 DIAGNOSIS — Z1389 Encounter for screening for other disorder: Secondary | ICD-10-CM | POA: Diagnosis not present

## 2019-11-03 DIAGNOSIS — F411 Generalized anxiety disorder: Secondary | ICD-10-CM | POA: Diagnosis not present

## 2019-11-03 DIAGNOSIS — G309 Alzheimer's disease, unspecified: Secondary | ICD-10-CM | POA: Diagnosis not present

## 2019-11-29 ENCOUNTER — Ambulatory Visit (INDEPENDENT_AMBULATORY_CARE_PROVIDER_SITE_OTHER): Payer: Medicare Other | Admitting: Cardiology

## 2019-11-29 ENCOUNTER — Encounter: Payer: Self-pay | Admitting: Cardiology

## 2019-11-29 ENCOUNTER — Other Ambulatory Visit: Payer: Self-pay

## 2019-11-29 ENCOUNTER — Ambulatory Visit: Payer: Medicare Other | Admitting: Cardiology

## 2019-11-29 VITALS — BP 98/58 | HR 74 | Ht 65.0 in | Wt 147.8 lb

## 2019-11-29 DIAGNOSIS — E782 Mixed hyperlipidemia: Secondary | ICD-10-CM | POA: Diagnosis not present

## 2019-11-29 DIAGNOSIS — I10 Essential (primary) hypertension: Secondary | ICD-10-CM

## 2019-11-29 DIAGNOSIS — I209 Angina pectoris, unspecified: Secondary | ICD-10-CM

## 2019-11-29 NOTE — Progress Notes (Signed)
Cardiology Office Note:    Date:  11/29/2019   ID:  Ralph West, DOB August 01, 1932, MRN [id number]  PCP:  Townsend Roger, MD  Cardiologist:  Shirlee More, MD    Referring MD: Townsend Roger, MD    ASSESSMENT:    1. Angina pectoris, unspecified ([place])    2. Essential hypertension   3. Mixed hyperlipidemia    PLAN:    In order of problems listed above:  1. He has done quite well I have no doubt that he is underlying CAD I think his comorbidities and severe dementia make him a poor candidate for revascularization he is done exceptionally well with medical therapy his wife agrees and will continue treatment including aspirin low-dose beta-blocker and high intensity statin. 2. Stable no symptomatic hypotension 3. Continue high intensity statin with CAD   Next appointment: 6 months   Medication Adjustments/Labs and Tests Ordered: Current medicines are reviewed at length with the patient today.  Concerns regarding medicines are outlined above.  No orders of the defined types were placed in this encounter.  No orders of the defined types were placed in this encounter.   Chief complaint, follow-up for heart disease  History of Present Illness:    Ralph West is a 86 y.o. male with a hx of a type II myocardial infarction after gallbladder surgery hypertension dyslipidemia last seen 08/24/2019.  Postoperatively he had symptomatic hypotension and ACE inhibitor was discontinued.  His event monitor showed rare ventricular and supraventricular ectopy. Compliance with diet, lifestyle and medications: Yes  His wife is present participates in the evaluation decision making.  He has had one episode where she gave him nitroglycerin and he is complaining of chest discomfort and palpitation with quick recovery no episodes of syncope he has had no exercise intolerance dyspnea chest pain and overall both are pleased with the quality of his life I reviewed his recent labs with him.  Recent labs  from his primary care physician 11/03/2019 show normal CBC hemoglobin 14.5 platelets 187,000, CMP was unremarkable creatinine 0.98 GFR 70 cc potassium 3.9 bilirubin was elevated 1.8 remainder of his liver function test were normal A1c at target 6.1% TSH was normal. Past Medical History:  Diagnosis Date  . Anxiety, generalized   . Diabetes mellitus without complication ([place])   . Hernia, umbilical   . Hyperlipidemia   . Hypertension   . Kidney stones     Past Surgical History:  Procedure Laterality Date  . CHOLECYSTECTOMY    . INGUINAL HERNIA REPAIR    . KIDNEY STONE SURGERY      Current Medications: Current Meds  Medication Sig  . aspirin EC 81 MG tablet Take 81 mg by mouth daily.  . Calcium Carbonate-Vitamin D (OYSTER SHELL CALCIUM/D PO) Take 1 tablet by mouth in the morning and at bedtime.  . donepezil (ARICEPT) 10 MG tablet Take 10 mg by mouth daily.  . metoprolol tartrate (LOPRESSOR) 25 MG tablet Take 25 mg by mouth 2 (two) times daily.  . multivitamin-iron-minerals-folic acid (CENTRUM) chewable tablet Chew 1 tablet by mouth daily.  . rosuvastatin (CRESTOR) 5 MG tablet Take 5 mg by mouth daily.  . saw palmetto 160 MG capsule Take 160 mg by mouth daily.   . sertraline (ZOLOFT) 25 MG tablet Take 25 mg by mouth daily.     Allergies:   Patient has no known allergies.   Social History   Socioeconomic History  . Marital status: Married    Spouse name: Not on file  .  Number of children: Not on file  . Years of education: Not on file  . Highest education level: Not on file  Occupational History  . Not on file  Tobacco Use  . Smoking status: Former Research scientist (life sciences)  . Smokeless tobacco: Never Used  Substance and Sexual Activity  . Alcohol use: Not Currently  . Drug use: Not on file  . Sexual activity: Not on file  Other Topics Concern  . Not on file  Social History Narrative   Pt lives at home with his wife. He has 3 children. Pt completed grade school.   Social Determinants of  Health   Financial Resource Strain:   . Difficulty of Paying Living Expenses: Not on file  Food Insecurity:   . Worried About Charity fundraiser in the Last Year: Not on file  . Ran Out of Food in the Last Year: Not on file  Transportation Needs:   . Lack of Transportation (Medical): Not on file  . Lack of Transportation (Non-Medical): Not on file  Physical Activity:   . Days of Exercise per Week: Not on file  . Minutes of Exercise per Session: Not on file  Stress:   . Feeling of Stress : Not on file  Social Connections:   . Frequency of Communication with Friends and Family: Not on file  . Frequency of Social Gatherings with Friends and Family: Not on file  . Attends Religious Services: Not on file  . Active Member of Clubs or Organizations: Not on file  . Attends Archivist Meetings: Not on file  . Marital Status: Not on file     Family History: The patient's family history includes Alzheimer's disease in his sister; Cancer in his brother. ROS:   Please see the history of present illness.    All other systems reviewed and are negative.  EKGs/Labs/Other Studies Reviewed:    The following studies were reviewed today:    Recent Labs: 08/24/2019: ALT 28; BUN 16; Creatinine, Ser 1.07; Potassium 4.3; Sodium 144  Recent Lipid Panel    Component Value Date/Time   CHOL 137 08/24/2019 1151   TRIG 249 (H) 08/24/2019 1151   HDL 47 08/24/2019 1151   CHOLHDL 2.9 08/24/2019 1151   LDLCALC 51 08/24/2019 1151    Physical Exam:    VS:  BP (!) 98/58   Pulse 74   Ht 5\' 5"  (1.651 m)   Wt 147 lb 12.8 oz (67 kg)   SpO2 97%   BMI 24.60 kg/m     Wt Readings from Last 3 Encounters:  11/29/19 147 lb 12.8 oz (67 kg)  08/24/19 138 lb 9.6 oz (62.9 kg)  07/20/19 139 lb (63 kg)     GEN: He looks less frail well nourished, well developed in no acute distress HEENT: Normal NECK: No JVD; No carotid bruits LYMPHATICS: No lymphadenopathy CARDIAC: RRR, no murmurs, rubs,  gallops RESPIRATORY:  Clear to auscultation without rales, wheezing or rhonchi  ABDOMEN: Soft, non-tender, non-distended MUSCULOSKELETAL:  No edema; No deformity  SKIN: Warm and dry NEUROLOGIC:  Alert and oriented x 3 PSYCHIATRIC:  Normal affect    Signed, Shirlee More, MD  11/29/2019 3:05 PM    [facility]

## 2019-11-29 NOTE — Patient Instructions (Signed)

## 2020-01-05 DIAGNOSIS — M545 Low back pain, unspecified: Secondary | ICD-10-CM | POA: Diagnosis not present

## 2020-01-12 DIAGNOSIS — M5136 Other intervertebral disc degeneration, lumbar region: Secondary | ICD-10-CM | POA: Diagnosis not present

## 2020-01-12 DIAGNOSIS — R262 Difficulty in walking, not elsewhere classified: Secondary | ICD-10-CM | POA: Diagnosis not present

## 2020-01-12 DIAGNOSIS — M545 Low back pain, unspecified: Secondary | ICD-10-CM | POA: Diagnosis not present

## 2020-01-15 DIAGNOSIS — R296 Repeated falls: Secondary | ICD-10-CM | POA: Diagnosis not present

## 2020-01-15 DIAGNOSIS — M25559 Pain in unspecified hip: Secondary | ICD-10-CM | POA: Diagnosis not present

## 2020-01-15 DIAGNOSIS — M5136 Other intervertebral disc degeneration, lumbar region: Secondary | ICD-10-CM | POA: Diagnosis not present

## 2020-01-15 DIAGNOSIS — Z87891 Personal history of nicotine dependence: Secondary | ICD-10-CM | POA: Diagnosis not present

## 2020-01-15 DIAGNOSIS — N200 Calculus of kidney: Secondary | ICD-10-CM | POA: Diagnosis not present

## 2020-01-15 DIAGNOSIS — Z7982 Long term (current) use of aspirin: Secondary | ICD-10-CM | POA: Diagnosis not present

## 2020-01-15 DIAGNOSIS — F0390 Unspecified dementia without behavioral disturbance: Secondary | ICD-10-CM | POA: Diagnosis not present

## 2020-01-15 DIAGNOSIS — I252 Old myocardial infarction: Secondary | ICD-10-CM | POA: Diagnosis not present

## 2020-01-15 DIAGNOSIS — E119 Type 2 diabetes mellitus without complications: Secondary | ICD-10-CM | POA: Diagnosis not present

## 2020-01-15 DIAGNOSIS — I10 Essential (primary) hypertension: Secondary | ICD-10-CM | POA: Diagnosis not present

## 2020-01-15 DIAGNOSIS — Z9181 History of falling: Secondary | ICD-10-CM | POA: Diagnosis not present

## 2020-01-16 DIAGNOSIS — M25559 Pain in unspecified hip: Secondary | ICD-10-CM | POA: Diagnosis not present

## 2020-01-16 DIAGNOSIS — I10 Essential (primary) hypertension: Secondary | ICD-10-CM | POA: Diagnosis not present

## 2020-01-16 DIAGNOSIS — N200 Calculus of kidney: Secondary | ICD-10-CM | POA: Diagnosis not present

## 2020-01-16 DIAGNOSIS — F0390 Unspecified dementia without behavioral disturbance: Secondary | ICD-10-CM | POA: Diagnosis not present

## 2020-01-16 DIAGNOSIS — R296 Repeated falls: Secondary | ICD-10-CM | POA: Diagnosis not present

## 2020-01-16 DIAGNOSIS — M5136 Other intervertebral disc degeneration, lumbar region: Secondary | ICD-10-CM | POA: Diagnosis not present

## 2020-01-23 DIAGNOSIS — N200 Calculus of kidney: Secondary | ICD-10-CM | POA: Diagnosis not present

## 2020-01-23 DIAGNOSIS — F0390 Unspecified dementia without behavioral disturbance: Secondary | ICD-10-CM | POA: Diagnosis not present

## 2020-01-23 DIAGNOSIS — M25559 Pain in unspecified hip: Secondary | ICD-10-CM | POA: Diagnosis not present

## 2020-01-23 DIAGNOSIS — M5136 Other intervertebral disc degeneration, lumbar region: Secondary | ICD-10-CM | POA: Diagnosis not present

## 2020-01-23 DIAGNOSIS — I10 Essential (primary) hypertension: Secondary | ICD-10-CM | POA: Diagnosis not present

## 2020-01-23 DIAGNOSIS — R296 Repeated falls: Secondary | ICD-10-CM | POA: Diagnosis not present

## 2020-01-24 DIAGNOSIS — M25559 Pain in unspecified hip: Secondary | ICD-10-CM | POA: Diagnosis not present

## 2020-01-24 DIAGNOSIS — F0390 Unspecified dementia without behavioral disturbance: Secondary | ICD-10-CM | POA: Diagnosis not present

## 2020-01-24 DIAGNOSIS — N200 Calculus of kidney: Secondary | ICD-10-CM | POA: Diagnosis not present

## 2020-01-24 DIAGNOSIS — I10 Essential (primary) hypertension: Secondary | ICD-10-CM | POA: Diagnosis not present

## 2020-01-24 DIAGNOSIS — M5136 Other intervertebral disc degeneration, lumbar region: Secondary | ICD-10-CM | POA: Diagnosis not present

## 2020-01-24 DIAGNOSIS — R296 Repeated falls: Secondary | ICD-10-CM | POA: Diagnosis not present

## 2020-01-30 DIAGNOSIS — I10 Essential (primary) hypertension: Secondary | ICD-10-CM | POA: Diagnosis not present

## 2020-01-30 DIAGNOSIS — F0390 Unspecified dementia without behavioral disturbance: Secondary | ICD-10-CM | POA: Diagnosis not present

## 2020-01-30 DIAGNOSIS — N200 Calculus of kidney: Secondary | ICD-10-CM | POA: Diagnosis not present

## 2020-01-30 DIAGNOSIS — M5136 Other intervertebral disc degeneration, lumbar region: Secondary | ICD-10-CM | POA: Diagnosis not present

## 2020-01-30 DIAGNOSIS — M25559 Pain in unspecified hip: Secondary | ICD-10-CM | POA: Diagnosis not present

## 2020-01-30 DIAGNOSIS — R296 Repeated falls: Secondary | ICD-10-CM | POA: Diagnosis not present

## 2020-01-31 DIAGNOSIS — N200 Calculus of kidney: Secondary | ICD-10-CM | POA: Diagnosis not present

## 2020-01-31 DIAGNOSIS — I10 Essential (primary) hypertension: Secondary | ICD-10-CM | POA: Diagnosis not present

## 2020-01-31 DIAGNOSIS — M5136 Other intervertebral disc degeneration, lumbar region: Secondary | ICD-10-CM | POA: Diagnosis not present

## 2020-01-31 DIAGNOSIS — M25559 Pain in unspecified hip: Secondary | ICD-10-CM | POA: Diagnosis not present

## 2020-01-31 DIAGNOSIS — R296 Repeated falls: Secondary | ICD-10-CM | POA: Diagnosis not present

## 2020-01-31 DIAGNOSIS — F0390 Unspecified dementia without behavioral disturbance: Secondary | ICD-10-CM | POA: Diagnosis not present

## 2020-02-05 DIAGNOSIS — M5136 Other intervertebral disc degeneration, lumbar region: Secondary | ICD-10-CM | POA: Diagnosis not present

## 2020-02-05 DIAGNOSIS — R296 Repeated falls: Secondary | ICD-10-CM | POA: Diagnosis not present

## 2020-02-05 DIAGNOSIS — I10 Essential (primary) hypertension: Secondary | ICD-10-CM | POA: Diagnosis not present

## 2020-02-05 DIAGNOSIS — N200 Calculus of kidney: Secondary | ICD-10-CM | POA: Diagnosis not present

## 2020-02-05 DIAGNOSIS — M25559 Pain in unspecified hip: Secondary | ICD-10-CM | POA: Diagnosis not present

## 2020-02-05 DIAGNOSIS — F0390 Unspecified dementia without behavioral disturbance: Secondary | ICD-10-CM | POA: Diagnosis not present

## 2020-02-06 DIAGNOSIS — I10 Essential (primary) hypertension: Secondary | ICD-10-CM | POA: Diagnosis not present

## 2020-02-06 DIAGNOSIS — E119 Type 2 diabetes mellitus without complications: Secondary | ICD-10-CM | POA: Diagnosis not present

## 2020-02-06 DIAGNOSIS — M5136 Other intervertebral disc degeneration, lumbar region: Secondary | ICD-10-CM | POA: Diagnosis not present

## 2020-02-14 DIAGNOSIS — Z9181 History of falling: Secondary | ICD-10-CM | POA: Diagnosis not present

## 2020-02-14 DIAGNOSIS — I252 Old myocardial infarction: Secondary | ICD-10-CM | POA: Diagnosis not present

## 2020-02-14 DIAGNOSIS — M5136 Other intervertebral disc degeneration, lumbar region: Secondary | ICD-10-CM | POA: Diagnosis not present

## 2020-02-14 DIAGNOSIS — N200 Calculus of kidney: Secondary | ICD-10-CM | POA: Diagnosis not present

## 2020-02-14 DIAGNOSIS — I10 Essential (primary) hypertension: Secondary | ICD-10-CM | POA: Diagnosis not present

## 2020-02-14 DIAGNOSIS — Z87891 Personal history of nicotine dependence: Secondary | ICD-10-CM | POA: Diagnosis not present

## 2020-02-14 DIAGNOSIS — M25559 Pain in unspecified hip: Secondary | ICD-10-CM | POA: Diagnosis not present

## 2020-02-14 DIAGNOSIS — Z7982 Long term (current) use of aspirin: Secondary | ICD-10-CM | POA: Diagnosis not present

## 2020-02-14 DIAGNOSIS — R296 Repeated falls: Secondary | ICD-10-CM | POA: Diagnosis not present

## 2020-02-14 DIAGNOSIS — F0390 Unspecified dementia without behavioral disturbance: Secondary | ICD-10-CM | POA: Diagnosis not present

## 2020-02-14 DIAGNOSIS — E119 Type 2 diabetes mellitus without complications: Secondary | ICD-10-CM | POA: Diagnosis not present

## 2020-02-15 DIAGNOSIS — I10 Essential (primary) hypertension: Secondary | ICD-10-CM | POA: Diagnosis not present

## 2020-02-15 DIAGNOSIS — R296 Repeated falls: Secondary | ICD-10-CM | POA: Diagnosis not present

## 2020-02-15 DIAGNOSIS — F0390 Unspecified dementia without behavioral disturbance: Secondary | ICD-10-CM | POA: Diagnosis not present

## 2020-02-15 DIAGNOSIS — L821 Other seborrheic keratosis: Secondary | ICD-10-CM | POA: Diagnosis not present

## 2020-02-15 DIAGNOSIS — M25559 Pain in unspecified hip: Secondary | ICD-10-CM | POA: Diagnosis not present

## 2020-02-15 DIAGNOSIS — D225 Melanocytic nevi of trunk: Secondary | ICD-10-CM | POA: Diagnosis not present

## 2020-02-15 DIAGNOSIS — L814 Other melanin hyperpigmentation: Secondary | ICD-10-CM | POA: Diagnosis not present

## 2020-02-15 DIAGNOSIS — D2239 Melanocytic nevi of other parts of face: Secondary | ICD-10-CM | POA: Diagnosis not present

## 2020-02-15 DIAGNOSIS — N200 Calculus of kidney: Secondary | ICD-10-CM | POA: Diagnosis not present

## 2020-02-15 DIAGNOSIS — M5136 Other intervertebral disc degeneration, lumbar region: Secondary | ICD-10-CM | POA: Diagnosis not present

## 2020-02-20 DIAGNOSIS — M5136 Other intervertebral disc degeneration, lumbar region: Secondary | ICD-10-CM | POA: Diagnosis not present

## 2020-02-20 DIAGNOSIS — R296 Repeated falls: Secondary | ICD-10-CM | POA: Diagnosis not present

## 2020-02-20 DIAGNOSIS — M25559 Pain in unspecified hip: Secondary | ICD-10-CM | POA: Diagnosis not present

## 2020-02-20 DIAGNOSIS — I10 Essential (primary) hypertension: Secondary | ICD-10-CM | POA: Diagnosis not present

## 2020-02-20 DIAGNOSIS — F0390 Unspecified dementia without behavioral disturbance: Secondary | ICD-10-CM | POA: Diagnosis not present

## 2020-02-20 DIAGNOSIS — N200 Calculus of kidney: Secondary | ICD-10-CM | POA: Diagnosis not present

## 2020-02-26 DIAGNOSIS — F0390 Unspecified dementia without behavioral disturbance: Secondary | ICD-10-CM | POA: Diagnosis not present

## 2020-02-26 DIAGNOSIS — M25559 Pain in unspecified hip: Secondary | ICD-10-CM | POA: Diagnosis not present

## 2020-02-26 DIAGNOSIS — R296 Repeated falls: Secondary | ICD-10-CM | POA: Diagnosis not present

## 2020-02-26 DIAGNOSIS — N200 Calculus of kidney: Secondary | ICD-10-CM | POA: Diagnosis not present

## 2020-02-26 DIAGNOSIS — M5136 Other intervertebral disc degeneration, lumbar region: Secondary | ICD-10-CM | POA: Diagnosis not present

## 2020-02-26 DIAGNOSIS — I10 Essential (primary) hypertension: Secondary | ICD-10-CM | POA: Diagnosis not present

## 2020-03-04 DIAGNOSIS — F0390 Unspecified dementia without behavioral disturbance: Secondary | ICD-10-CM | POA: Diagnosis not present

## 2020-03-04 DIAGNOSIS — M5136 Other intervertebral disc degeneration, lumbar region: Secondary | ICD-10-CM | POA: Diagnosis not present

## 2020-03-04 DIAGNOSIS — N200 Calculus of kidney: Secondary | ICD-10-CM | POA: Diagnosis not present

## 2020-03-04 DIAGNOSIS — R296 Repeated falls: Secondary | ICD-10-CM | POA: Diagnosis not present

## 2020-03-04 DIAGNOSIS — I10 Essential (primary) hypertension: Secondary | ICD-10-CM | POA: Diagnosis not present

## 2020-03-04 DIAGNOSIS — M25559 Pain in unspecified hip: Secondary | ICD-10-CM | POA: Diagnosis not present

## 2020-03-13 DIAGNOSIS — M25559 Pain in unspecified hip: Secondary | ICD-10-CM | POA: Diagnosis not present

## 2020-03-13 DIAGNOSIS — F0390 Unspecified dementia without behavioral disturbance: Secondary | ICD-10-CM | POA: Diagnosis not present

## 2020-03-13 DIAGNOSIS — I10 Essential (primary) hypertension: Secondary | ICD-10-CM | POA: Diagnosis not present

## 2020-03-13 DIAGNOSIS — R296 Repeated falls: Secondary | ICD-10-CM | POA: Diagnosis not present

## 2020-03-13 DIAGNOSIS — M5136 Other intervertebral disc degeneration, lumbar region: Secondary | ICD-10-CM | POA: Diagnosis not present

## 2020-03-13 DIAGNOSIS — N200 Calculus of kidney: Secondary | ICD-10-CM | POA: Diagnosis not present

## 2020-05-08 DIAGNOSIS — W19XXXD Unspecified fall, subsequent encounter: Secondary | ICD-10-CM | POA: Diagnosis not present

## 2020-05-08 DIAGNOSIS — E119 Type 2 diabetes mellitus without complications: Secondary | ICD-10-CM | POA: Diagnosis not present

## 2020-05-08 DIAGNOSIS — R350 Frequency of micturition: Secondary | ICD-10-CM | POA: Diagnosis not present

## 2020-05-15 DIAGNOSIS — Z7409 Other reduced mobility: Secondary | ICD-10-CM | POA: Diagnosis not present

## 2020-05-15 DIAGNOSIS — M6281 Muscle weakness (generalized): Secondary | ICD-10-CM | POA: Diagnosis not present

## 2020-05-20 DIAGNOSIS — Z7409 Other reduced mobility: Secondary | ICD-10-CM | POA: Diagnosis not present

## 2020-05-20 DIAGNOSIS — M6281 Muscle weakness (generalized): Secondary | ICD-10-CM | POA: Diagnosis not present

## 2020-05-22 DIAGNOSIS — M6281 Muscle weakness (generalized): Secondary | ICD-10-CM | POA: Diagnosis not present

## 2020-05-22 DIAGNOSIS — Z7409 Other reduced mobility: Secondary | ICD-10-CM | POA: Diagnosis not present

## 2020-05-27 DIAGNOSIS — M6281 Muscle weakness (generalized): Secondary | ICD-10-CM | POA: Diagnosis not present

## 2020-05-27 DIAGNOSIS — Z7409 Other reduced mobility: Secondary | ICD-10-CM | POA: Diagnosis not present

## 2020-05-30 DIAGNOSIS — Z7409 Other reduced mobility: Secondary | ICD-10-CM | POA: Diagnosis not present

## 2020-05-30 DIAGNOSIS — M6281 Muscle weakness (generalized): Secondary | ICD-10-CM | POA: Diagnosis not present

## 2020-06-03 DIAGNOSIS — Z7409 Other reduced mobility: Secondary | ICD-10-CM | POA: Diagnosis not present

## 2020-06-03 DIAGNOSIS — M6281 Muscle weakness (generalized): Secondary | ICD-10-CM | POA: Diagnosis not present

## 2020-06-06 DIAGNOSIS — Z7409 Other reduced mobility: Secondary | ICD-10-CM | POA: Diagnosis not present

## 2020-06-06 DIAGNOSIS — M6281 Muscle weakness (generalized): Secondary | ICD-10-CM | POA: Diagnosis not present

## 2020-06-11 DIAGNOSIS — Z7409 Other reduced mobility: Secondary | ICD-10-CM | POA: Diagnosis not present

## 2020-06-11 DIAGNOSIS — M6281 Muscle weakness (generalized): Secondary | ICD-10-CM | POA: Diagnosis not present

## 2020-06-14 DIAGNOSIS — Z7409 Other reduced mobility: Secondary | ICD-10-CM | POA: Diagnosis not present

## 2020-06-14 DIAGNOSIS — M6281 Muscle weakness (generalized): Secondary | ICD-10-CM | POA: Diagnosis not present

## 2020-06-19 DIAGNOSIS — M6281 Muscle weakness (generalized): Secondary | ICD-10-CM | POA: Diagnosis not present

## 2020-06-19 DIAGNOSIS — Z7409 Other reduced mobility: Secondary | ICD-10-CM | POA: Diagnosis not present

## 2020-06-25 DIAGNOSIS — M6281 Muscle weakness (generalized): Secondary | ICD-10-CM | POA: Diagnosis not present

## 2020-06-25 DIAGNOSIS — Z7409 Other reduced mobility: Secondary | ICD-10-CM | POA: Diagnosis not present

## 2020-06-27 DIAGNOSIS — M6281 Muscle weakness (generalized): Secondary | ICD-10-CM | POA: Diagnosis not present

## 2020-06-27 DIAGNOSIS — Z7409 Other reduced mobility: Secondary | ICD-10-CM | POA: Diagnosis not present

## 2020-07-08 DIAGNOSIS — N200 Calculus of kidney: Secondary | ICD-10-CM | POA: Insufficient documentation

## 2020-07-08 DIAGNOSIS — I10 Essential (primary) hypertension: Secondary | ICD-10-CM | POA: Insufficient documentation

## 2020-07-08 DIAGNOSIS — Z7409 Other reduced mobility: Secondary | ICD-10-CM | POA: Diagnosis not present

## 2020-07-08 DIAGNOSIS — M6281 Muscle weakness (generalized): Secondary | ICD-10-CM | POA: Diagnosis not present

## 2020-07-08 DIAGNOSIS — F411 Generalized anxiety disorder: Secondary | ICD-10-CM | POA: Insufficient documentation

## 2020-07-08 DIAGNOSIS — E785 Hyperlipidemia, unspecified: Secondary | ICD-10-CM | POA: Insufficient documentation

## 2020-07-08 DIAGNOSIS — E119 Type 2 diabetes mellitus without complications: Secondary | ICD-10-CM | POA: Insufficient documentation

## 2020-07-08 DIAGNOSIS — K429 Umbilical hernia without obstruction or gangrene: Secondary | ICD-10-CM | POA: Insufficient documentation

## 2020-07-10 DIAGNOSIS — M6281 Muscle weakness (generalized): Secondary | ICD-10-CM | POA: Diagnosis not present

## 2020-07-10 DIAGNOSIS — Z7409 Other reduced mobility: Secondary | ICD-10-CM | POA: Diagnosis not present

## 2020-07-23 ENCOUNTER — Other Ambulatory Visit: Payer: Self-pay

## 2020-07-23 ENCOUNTER — Encounter: Payer: Self-pay | Admitting: Cardiology

## 2020-07-23 ENCOUNTER — Ambulatory Visit (INDEPENDENT_AMBULATORY_CARE_PROVIDER_SITE_OTHER): Payer: Medicare Other | Admitting: Cardiology

## 2020-07-23 VITALS — BP 126/64 | HR 65 | Ht 65.0 in | Wt 157.0 lb

## 2020-07-23 DIAGNOSIS — E782 Mixed hyperlipidemia: Secondary | ICD-10-CM | POA: Diagnosis not present

## 2020-07-23 DIAGNOSIS — I10 Essential (primary) hypertension: Secondary | ICD-10-CM | POA: Diagnosis not present

## 2020-07-23 DIAGNOSIS — I209 Angina pectoris, unspecified: Secondary | ICD-10-CM

## 2020-07-23 MED ORDER — NITROGLYCERIN 0.4 MG SL SUBL
0.4000 mg | SUBLINGUAL_TABLET | SUBLINGUAL | 3 refills | Status: AC | PRN
Start: 2020-07-23 — End: 2021-01-21

## 2020-07-23 NOTE — Progress Notes (Signed)
Cardiology Office Note:    Date:  07/23/2020   ID:  Ralph West, DOB May 23, 1932, MRN [id number]  PCP:  Townsend Roger, MD  Cardiologist:  Shirlee More, MD    Referring MD: Townsend Roger, MD    ASSESSMENT:    1. Angina pectoris, unspecified ([place])    2. Essential hypertension   3. Mixed hyperlipidemia    PLAN:    In order of problems listed above:  Overall he is doing well he had a type II myocardial infarction and because of his dementia we elected to do take a conservative careful cautious approach and brace medical treatment we will continue aspirin beta-blocker statin and as needed nitroglycerin.  I would not advise an ischemia evaluation at this time his wife agrees At target continue treatment beta-blocker Continue his high intensity statin   Next appointment: 9 months   Medication Adjustments/Labs and Tests Ordered: Current medicines are reviewed at length with the patient today.  Concerns regarding medicines are outlined above.  No orders of the defined types were placed in this encounter.  No orders of the defined types were placed in this encounter.   Chief Complaint  Patient presents with   Follow-up    For CAD with a history of type II myocardial infarction and typical angina   Coronary Artery Disease   Hypertension     History of Present Illness:    Ralph West is a 87 y.o. male with a hx of type II myocardial infarction July 2021 perioperatively with gallbladder surgery his echocardiogram showed a normal ejection fraction hypertension hyperlipidemia and dementia last seen 08/23/2020.  Previously had hypotension with ACE inhibitor.  He has had complaints of palpitation but no documented significant arrhythmia with event monitor. Compliance with diet, lifestyle and medications: Yes  His wife is present participates in his care supervises him makes decisions along with the patient. He has had several falls without trauma and is now using a walker they  are able to remain in their home. He has had a few episodes that he complains of feeling anxious some chest discomfort and maybe once a month she is given a nitroglycerin with relief.  He is not having typical exertional angina No edema orthopnea cough wheezing palpitation or syncope. Recent labs performed PCP office 05/08/2020 A1c 6.5%, cholesterol 151 LDL 75 triglycerides 128 HDL 53. He tolerates his statin without muscle pain or weakness Past Medical History:  Diagnosis Date   Anxiety, generalized    Diabetes mellitus without complication ([place])    Hernia, umbilical    Hyperlipidemia    Hypertension    Kidney stones     Past Surgical History:  Procedure Laterality Date   CHOLECYSTECTOMY     INGUINAL HERNIA REPAIR     KIDNEY STONE SURGERY      Current Medications: Current Meds  Medication Sig   aspirin EC 81 MG tablet Take 81 mg by mouth daily.   Calcium Carbonate-Vitamin D (OYSTER SHELL CALCIUM/D PO) Take 1 tablet by mouth in the morning and at bedtime.   donepezil (ARICEPT) 10 MG tablet Take 10 mg by mouth daily.   metoprolol tartrate (LOPRESSOR) 25 MG tablet Take 25 mg by mouth 2 (two) times daily.   multivitamin-iron-minerals-folic acid (CENTRUM) chewable tablet Chew 1 tablet by mouth daily.   rosuvastatin (CRESTOR) 5 MG tablet Take 5 mg by mouth daily.   saw palmetto 160 MG capsule Take 160 mg by mouth daily.    sertraline (ZOLOFT) 25 MG tablet  Take 25 mg by mouth daily.     Allergies:   Patient has no known allergies.   Social History   Socioeconomic History   Marital status: Married    Spouse name: Not on file   Number of children: Not on file   Years of education: Not on file   Highest education level: Not on file  Occupational History   Not on file  Tobacco Use   Smoking status: Former    Pack years: 0.00   Smokeless tobacco: Never  Substance and Sexual Activity   Alcohol use: Not Currently   Drug use: Not on file   Sexual activity: Not on file  Other  Topics Concern   Not on file  Social History Narrative   Pt lives at home with his wife. He has 3 children. Pt completed grade school.   Social Determinants of Health   Financial Resource Strain: Not on file  Food Insecurity: Not on file  Transportation Needs: Not on file  Physical Activity: Not on file  Stress: Not on file  Social Connections: Not on file     Family History: The patient's family history includes Alzheimer's disease in his sister; Cancer in his brother. ROS:   Please see the history of present illness.    All other systems reviewed and are negative.  EKGs/Labs/Other Studies Reviewed:    The following studies were reviewed today:  EKG:  EKG ordered today and personally reviewed.  The ekg ordered today demonstrates sinus rhythm right bundle branch block left axis deviation old inferior MI  Recent Labs: 08/24/2019: ALT 28; BUN 16; Creatinine, Ser 1.07; Potassium 4.3; Sodium 144  Recent Lipid Panel    Component Value Date/Time   CHOL 137 08/24/2019 1151   TRIG 249 (H) 08/24/2019 1151   HDL 47 08/24/2019 1151   CHOLHDL 2.9 08/24/2019 1151   LDLCALC 51 08/24/2019 1151    Physical Exam:    VS:  BP 126/64 (BP Location: Right Arm, Patient Position: Sitting, Cuff Size: Normal)   Pulse 65   Ht 5\' 5"  (1.651 m)   Wt 157 lb (71.2 kg)   SpO2 97%   BMI 26.13 kg/m     Wt Readings from Last 3 Encounters:  07/23/20 157 lb (71.2 kg)  11/29/19 147 lb 12.8 oz (67 kg)  08/24/19 138 lb 9.6 oz (62.9 kg)     GEN: He looks frail well in no acute distress HEENT: Normal NECK: No JVD; No carotid bruits LYMPHATICS: No lymphadenopathy CARDIAC: RRR, no murmurs, rubs, gallops RESPIRATORY:  Clear to auscultation without rales, wheezing or rhonchi  ABDOMEN: Soft, non-tender, non-distended MUSCULOSKELETAL:  No edema; No deformity  SKIN: Warm and dry NEUROLOGIC:  Alert and oriented x 3 PSYCHIATRIC:  Normal affect    Signed, Shirlee More, MD  07/23/2020 10:28 AM    [facility]

## 2020-07-23 NOTE — Patient Instructions (Signed)
Medication Instructions:  Your physician has recommended you make the following change in your medication:  START: Nitroglycerin 0.4 mg take one tablet by mouth every 5 minutes up to three times as needed for chest pain.  *If you need a refill on your cardiac medications before your next appointment, please call your pharmacy*   Lab Work: None If you have labs (blood work) drawn today and your tests are completely normal, you will receive your results only by: [place] (if you have MyChart) OR A paper copy in the mail If you have any lab test that is abnormal or we need to change your treatment, we will call you to review the results.   Testing/Procedures: None   Follow-Up: At [facility], you and your health needs are our priority.  As part of our continuing mission to provide you with exceptional heart care, we have created designated Provider Care Teams.  These Care Teams include your primary Cardiologist (physician) and Advanced Practice Providers (APPs -  Physician Assistants and Nurse Practitioners) who all work together to provide you with the care you need, when you need it.  We recommend signing up for the patient portal called "MyChart".  Sign up information is provided on this After Visit Summary.  MyChart is used to connect with patients for Virtual Visits (Telemedicine).  Patients are able to view lab/test results, encounter notes, upcoming appointments, etc.  Non-urgent messages can be sent to your provider as well.   To learn more about what you can do with MyChart, go to [email or web address].    Your next appointment:   9 month(s)  The format for your next appointment:   In Person  Provider:   Shirlee More, MD   Other Instructions

## 2020-08-08 DIAGNOSIS — E119 Type 2 diabetes mellitus without complications: Secondary | ICD-10-CM | POA: Diagnosis not present

## 2020-08-08 DIAGNOSIS — I10 Essential (primary) hypertension: Secondary | ICD-10-CM | POA: Diagnosis not present

## 2020-08-16 DIAGNOSIS — H2513 Age-related nuclear cataract, bilateral: Secondary | ICD-10-CM | POA: Diagnosis not present

## 2020-10-22 DIAGNOSIS — Z23 Encounter for immunization: Secondary | ICD-10-CM | POA: Diagnosis not present

## 2020-10-22 DIAGNOSIS — Z111 Encounter for screening for respiratory tuberculosis: Secondary | ICD-10-CM | POA: Diagnosis not present

## 2020-10-25 DIAGNOSIS — E785 Hyperlipidemia, unspecified: Secondary | ICD-10-CM | POA: Diagnosis not present

## 2020-10-25 DIAGNOSIS — E119 Type 2 diabetes mellitus without complications: Secondary | ICD-10-CM | POA: Diagnosis not present

## 2020-12-18 DIAGNOSIS — M5136 Other intervertebral disc degeneration, lumbar region: Secondary | ICD-10-CM | POA: Diagnosis not present

## 2020-12-18 DIAGNOSIS — E1159 Type 2 diabetes mellitus with other circulatory complications: Secondary | ICD-10-CM | POA: Diagnosis not present

## 2020-12-18 DIAGNOSIS — Z0000 Encounter for general adult medical examination without abnormal findings: Secondary | ICD-10-CM | POA: Diagnosis not present

## 2020-12-18 DIAGNOSIS — Z1331 Encounter for screening for depression: Secondary | ICD-10-CM | POA: Diagnosis not present

## 2020-12-18 DIAGNOSIS — N400 Enlarged prostate without lower urinary tract symptoms: Secondary | ICD-10-CM | POA: Diagnosis not present

## 2020-12-18 DIAGNOSIS — G309 Alzheimer's disease, unspecified: Secondary | ICD-10-CM | POA: Diagnosis not present

## 2020-12-18 DIAGNOSIS — I25118 Atherosclerotic heart disease of native coronary artery with other forms of angina pectoris: Secondary | ICD-10-CM | POA: Diagnosis not present

## 2020-12-18 DIAGNOSIS — F411 Generalized anxiety disorder: Secondary | ICD-10-CM | POA: Diagnosis not present

## 2020-12-18 DIAGNOSIS — Z6826 Body mass index (BMI) 26.0-26.9, adult: Secondary | ICD-10-CM | POA: Diagnosis not present

## 2020-12-18 DIAGNOSIS — I10 Essential (primary) hypertension: Secondary | ICD-10-CM | POA: Diagnosis not present

## 2020-12-18 DIAGNOSIS — E7800 Pure hypercholesterolemia, unspecified: Secondary | ICD-10-CM | POA: Diagnosis not present

## 2020-12-18 DIAGNOSIS — F0280 Dementia in other diseases classified elsewhere without behavioral disturbance: Secondary | ICD-10-CM | POA: Diagnosis not present

## 2020-12-24 ENCOUNTER — Encounter (HOSPITAL_COMMUNITY): Payer: Self-pay

## 2020-12-24 ENCOUNTER — Other Ambulatory Visit: Payer: Self-pay

## 2020-12-24 ENCOUNTER — Emergency Department (HOSPITAL_COMMUNITY): Payer: Medicare Other

## 2020-12-24 ENCOUNTER — Inpatient Hospital Stay (HOSPITAL_COMMUNITY)
Admission: EM | Admit: 2020-12-24 | Discharge: 2021-01-06 | DRG: 100 | Disposition: A | Discharge status: Skilled Nursing Facility | Payer: Medicare Other | Attending: Student in an Organized Health Care Education/Training Program | Admitting: Student in an Organized Health Care Education/Training Program

## 2020-12-24 ENCOUNTER — Observation Stay (HOSPITAL_COMMUNITY): Payer: Medicare Other

## 2020-12-24 DIAGNOSIS — I252 Old myocardial infarction: Secondary | ICD-10-CM | POA: Diagnosis not present

## 2020-12-24 DIAGNOSIS — Z9049 Acquired absence of other specified parts of digestive tract: Secondary | ICD-10-CM

## 2020-12-24 DIAGNOSIS — Z820 Family history of epilepsy and other diseases of the nervous system: Secondary | ICD-10-CM

## 2020-12-24 DIAGNOSIS — F0284 Dementia in other diseases classified elsewhere, unspecified severity, with anxiety: Secondary | ICD-10-CM | POA: Diagnosis present

## 2020-12-24 DIAGNOSIS — Z781 Physical restraint status: Secondary | ICD-10-CM

## 2020-12-24 DIAGNOSIS — R0602 Shortness of breath: Secondary | ICD-10-CM | POA: Diagnosis not present

## 2020-12-24 DIAGNOSIS — I4891 Unspecified atrial fibrillation: Secondary | ICD-10-CM | POA: Diagnosis not present

## 2020-12-24 DIAGNOSIS — J9601 Acute respiratory failure with hypoxia: Secondary | ICD-10-CM | POA: Diagnosis present

## 2020-12-24 DIAGNOSIS — I959 Hypotension, unspecified: Secondary | ICD-10-CM | POA: Diagnosis not present

## 2020-12-24 DIAGNOSIS — R1312 Dysphagia, oropharyngeal phase: Secondary | ICD-10-CM | POA: Diagnosis not present

## 2020-12-24 DIAGNOSIS — E1165 Type 2 diabetes mellitus with hyperglycemia: Secondary | ICD-10-CM | POA: Diagnosis present

## 2020-12-24 DIAGNOSIS — F339 Major depressive disorder, recurrent, unspecified: Secondary | ICD-10-CM | POA: Diagnosis not present

## 2020-12-24 DIAGNOSIS — M810 Age-related osteoporosis without current pathological fracture: Secondary | ICD-10-CM | POA: Diagnosis not present

## 2020-12-24 DIAGNOSIS — R739 Hyperglycemia, unspecified: Secondary | ICD-10-CM | POA: Diagnosis not present

## 2020-12-24 DIAGNOSIS — R4702 Dysphasia: Secondary | ICD-10-CM | POA: Diagnosis present

## 2020-12-24 DIAGNOSIS — Z7982 Long term (current) use of aspirin: Secondary | ICD-10-CM

## 2020-12-24 DIAGNOSIS — I700 Atherosclerosis of aorta: Secondary | ICD-10-CM | POA: Diagnosis not present

## 2020-12-24 DIAGNOSIS — E860 Dehydration: Secondary | ICD-10-CM | POA: Diagnosis present

## 2020-12-24 DIAGNOSIS — I4719 Other supraventricular tachycardia: Secondary | ICD-10-CM | POA: Diagnosis present

## 2020-12-24 DIAGNOSIS — J690 Pneumonitis due to inhalation of food and vomit: Secondary | ICD-10-CM | POA: Diagnosis not present

## 2020-12-24 DIAGNOSIS — R2681 Unsteadiness on feet: Secondary | ICD-10-CM | POA: Diagnosis not present

## 2020-12-24 DIAGNOSIS — G309 Alzheimer's disease, unspecified: Secondary | ICD-10-CM | POA: Diagnosis present

## 2020-12-24 DIAGNOSIS — I471 Supraventricular tachycardia: Secondary | ICD-10-CM | POA: Diagnosis not present

## 2020-12-24 DIAGNOSIS — J90 Pleural effusion, not elsewhere classified: Secondary | ICD-10-CM | POA: Diagnosis not present

## 2020-12-24 DIAGNOSIS — G9341 Metabolic encephalopathy: Secondary | ICD-10-CM | POA: Diagnosis not present

## 2020-12-24 DIAGNOSIS — G40909 Epilepsy, unspecified, not intractable, without status epilepticus: Principal | ICD-10-CM | POA: Diagnosis present

## 2020-12-24 DIAGNOSIS — R0600 Dyspnea, unspecified: Secondary | ICD-10-CM | POA: Diagnosis not present

## 2020-12-24 DIAGNOSIS — R471 Dysarthria and anarthria: Secondary | ICD-10-CM | POA: Diagnosis present

## 2020-12-24 DIAGNOSIS — F918 Other conduct disorders: Secondary | ICD-10-CM | POA: Diagnosis not present

## 2020-12-24 DIAGNOSIS — R32 Unspecified urinary incontinence: Secondary | ICD-10-CM | POA: Diagnosis present

## 2020-12-24 DIAGNOSIS — R4701 Aphasia: Secondary | ICD-10-CM | POA: Diagnosis not present

## 2020-12-24 DIAGNOSIS — R41841 Cognitive communication deficit: Secondary | ICD-10-CM | POA: Diagnosis not present

## 2020-12-24 DIAGNOSIS — I6501 Occlusion and stenosis of right vertebral artery: Secondary | ICD-10-CM | POA: Diagnosis not present

## 2020-12-24 DIAGNOSIS — I6523 Occlusion and stenosis of bilateral carotid arteries: Secondary | ICD-10-CM | POA: Diagnosis not present

## 2020-12-24 DIAGNOSIS — R29810 Facial weakness: Secondary | ICD-10-CM | POA: Diagnosis present

## 2020-12-24 DIAGNOSIS — E785 Hyperlipidemia, unspecified: Secondary | ICD-10-CM | POA: Diagnosis present

## 2020-12-24 DIAGNOSIS — I5030 Unspecified diastolic (congestive) heart failure: Secondary | ICD-10-CM | POA: Diagnosis not present

## 2020-12-24 DIAGNOSIS — I6503 Occlusion and stenosis of bilateral vertebral arteries: Secondary | ICD-10-CM | POA: Diagnosis not present

## 2020-12-24 DIAGNOSIS — F411 Generalized anxiety disorder: Secondary | ICD-10-CM | POA: Diagnosis present

## 2020-12-24 DIAGNOSIS — I2510 Atherosclerotic heart disease of native coronary artery without angina pectoris: Secondary | ICD-10-CM | POA: Diagnosis not present

## 2020-12-24 DIAGNOSIS — Z7401 Bed confinement status: Secondary | ICD-10-CM | POA: Diagnosis not present

## 2020-12-24 DIAGNOSIS — R4182 Altered mental status, unspecified: Secondary | ICD-10-CM | POA: Diagnosis not present

## 2020-12-24 DIAGNOSIS — I10 Essential (primary) hypertension: Secondary | ICD-10-CM | POA: Diagnosis not present

## 2020-12-24 DIAGNOSIS — I208 Other forms of angina pectoris: Secondary | ICD-10-CM | POA: Diagnosis present

## 2020-12-24 DIAGNOSIS — R0902 Hypoxemia: Secondary | ICD-10-CM | POA: Diagnosis not present

## 2020-12-24 DIAGNOSIS — Z20822 Contact with and (suspected) exposure to covid-19: Secondary | ICD-10-CM | POA: Diagnosis present

## 2020-12-24 DIAGNOSIS — G9340 Encephalopathy, unspecified: Secondary | ICD-10-CM | POA: Diagnosis not present

## 2020-12-24 DIAGNOSIS — J9811 Atelectasis: Secondary | ICD-10-CM | POA: Diagnosis not present

## 2020-12-24 DIAGNOSIS — R29818 Other symptoms and signs involving the nervous system: Secondary | ICD-10-CM | POA: Diagnosis not present

## 2020-12-24 DIAGNOSIS — F05 Delirium due to known physiological condition: Secondary | ICD-10-CM | POA: Diagnosis not present

## 2020-12-24 DIAGNOSIS — E781 Pure hyperglyceridemia: Secondary | ICD-10-CM | POA: Diagnosis present

## 2020-12-24 DIAGNOSIS — E119 Type 2 diabetes mellitus without complications: Secondary | ICD-10-CM | POA: Diagnosis not present

## 2020-12-24 DIAGNOSIS — R2689 Other abnormalities of gait and mobility: Secondary | ICD-10-CM | POA: Diagnosis not present

## 2020-12-24 DIAGNOSIS — M6281 Muscle weakness (generalized): Secondary | ICD-10-CM | POA: Diagnosis not present

## 2020-12-24 DIAGNOSIS — R569 Unspecified convulsions: Principal | ICD-10-CM

## 2020-12-24 DIAGNOSIS — F0390 Unspecified dementia without behavioral disturbance: Secondary | ICD-10-CM | POA: Diagnosis present

## 2020-12-24 DIAGNOSIS — Z87891 Personal history of nicotine dependence: Secondary | ICD-10-CM

## 2020-12-24 DIAGNOSIS — R456 Violent behavior: Secondary | ICD-10-CM | POA: Diagnosis not present

## 2020-12-24 DIAGNOSIS — I739 Peripheral vascular disease, unspecified: Secondary | ICD-10-CM | POA: Diagnosis not present

## 2020-12-24 DIAGNOSIS — R410 Disorientation, unspecified: Secondary | ICD-10-CM

## 2020-12-24 DIAGNOSIS — R404 Transient alteration of awareness: Secondary | ICD-10-CM | POA: Diagnosis not present

## 2020-12-24 DIAGNOSIS — R000 Tachycardia, unspecified: Secondary | ICD-10-CM | POA: Diagnosis not present

## 2020-12-24 DIAGNOSIS — E861 Hypovolemia: Secondary | ICD-10-CM | POA: Diagnosis present

## 2020-12-24 DIAGNOSIS — F03911 Unspecified dementia, unspecified severity, with agitation: Secondary | ICD-10-CM | POA: Diagnosis not present

## 2020-12-24 DIAGNOSIS — Z79899 Other long term (current) drug therapy: Secondary | ICD-10-CM

## 2020-12-24 DIAGNOSIS — I469 Cardiac arrest, cause unspecified: Secondary | ICD-10-CM | POA: Diagnosis not present

## 2020-12-24 DIAGNOSIS — I6782 Cerebral ischemia: Secondary | ICD-10-CM | POA: Diagnosis not present

## 2020-12-24 DIAGNOSIS — F419 Anxiety disorder, unspecified: Secondary | ICD-10-CM | POA: Diagnosis not present

## 2020-12-24 DIAGNOSIS — G4089 Other seizures: Secondary | ICD-10-CM | POA: Diagnosis not present

## 2020-12-24 LAB — CBG MONITORING, ED
Glucose-Capillary: 197 mg/dL — ABNORMAL HIGH (ref 70–99)
Glucose-Capillary: 145 mg/dL — ABNORMAL HIGH (ref 70–99)
Glucose-Capillary: 104 mg/dL — ABNORMAL HIGH (ref 70–99)

## 2020-12-24 LAB — COMPREHENSIVE METABOLIC PANEL
ALT: 51 U/L — ABNORMAL HIGH (ref 0–44)
AST: 44 U/L — ABNORMAL HIGH (ref 15–41)
Albumin: 3.4 g/dL — ABNORMAL LOW (ref 3.5–5.0)
Alkaline Phosphatase: 75 U/L (ref 38–126)
Anion gap: 10 (ref 5–15)
BUN: 17 mg/dL (ref 8–23)
CO2: 23 mmol/L (ref 22–32)
Calcium: 8.8 mg/dL — ABNORMAL LOW (ref 8.9–10.3)
Chloride: 105 mmol/L (ref 98–111)
Creatinine, Ser: 1.15 mg/dL (ref 0.61–1.24)
GFR, Estimated: >60 mL/min (ref >60)
Glucose, Bld: 204 mg/dL — ABNORMAL HIGH (ref 70–99)
Potassium: 3.6 mmol/L (ref 3.5–5.1)
Sodium: 138 mmol/L (ref 135–145)
Total Bilirubin: 1.3 mg/dL — ABNORMAL HIGH (ref 0.3–1.2)
Total Protein: 6.1 g/dL — ABNORMAL LOW (ref 6.5–8.1)

## 2020-12-24 LAB — CBC
HCT: 44.4 % (ref 39.0–52.0)
Hemoglobin: 14.9 g/dL (ref 13.0–17.0)
MCH: 32.7 pg (ref 26.0–34.0)
MCHC: 33.6 g/dL (ref 30.0–36.0)
MCV: 97.4 fL (ref 80.0–100.0)
Platelets: 184 10*3/uL (ref 150–400)
RBC: 4.56 MIL/uL (ref 4.22–5.81)
RDW: 12.6 % (ref 11.5–15.5)
WBC: 12.5 10*3/uL — ABNORMAL HIGH (ref 4.0–10.5)
nRBC: 0 % (ref 0.0–0.2)

## 2020-12-24 LAB — I-STAT CHEM 8, ED
BUN: 22 mg/dL (ref 8–23)
Calcium, Ion: 1.02 mmol/L — ABNORMAL LOW (ref 1.15–1.40)
Chloride: 106 mmol/L (ref 98–111)
Creatinine, Ser: 0.9 mg/dL (ref 0.61–1.24)
Glucose, Bld: 200 mg/dL — ABNORMAL HIGH (ref 70–99)
HCT: 44 % (ref 39.0–52.0)
Hemoglobin: 15 g/dL (ref 13.0–17.0)
Potassium: 3.8 mmol/L (ref 3.5–5.1)
Sodium: 139 mmol/L (ref 135–145)
TCO2: 24 mmol/L (ref 22–32)

## 2020-12-24 LAB — VITAMIN B12: Vitamin B-12: 882 pg/mL (ref 180–914)

## 2020-12-24 LAB — URINALYSIS, MICROSCOPIC (REFLEX)

## 2020-12-24 LAB — DIFFERENTIAL
Abs Immature Granulocytes: 0.06 10*3/uL (ref 0.00–0.07)
Basophils Absolute: 0 10*3/uL (ref 0.0–0.1)
Basophils Relative: 0 %
Eosinophils Absolute: 0.1 10*3/uL (ref 0.0–0.5)
Eosinophils Relative: 1 %
Immature Granulocytes: 1 %
Lymphocytes Relative: 10 %
Lymphs Abs: 1.2 10*3/uL (ref 0.7–4.0)
Monocytes Absolute: 0.5 10*3/uL (ref 0.1–1.0)
Monocytes Relative: 4 %
Neutro Abs: 10.6 10*3/uL — ABNORMAL HIGH (ref 1.7–7.7)
Neutrophils Relative %: 84 %

## 2020-12-24 LAB — RESP PANEL BY RT-PCR (FLU A&B, COVID) ARPGX2
Influenza A by PCR: NEGATIVE
Influenza B by PCR: NEGATIVE
SARS Coronavirus 2 by RT PCR: NEGATIVE

## 2020-12-24 LAB — SALICYLATE LEVEL: Salicylate Lvl: <7 mg/dL — ABNORMAL LOW (ref 7.0–30.0)

## 2020-12-24 LAB — URINALYSIS, ROUTINE W REFLEX MICROSCOPIC
Bilirubin Urine: NEGATIVE
Glucose, UA: 100 mg/dL — AB
Ketones, ur: NEGATIVE mg/dL
Leukocytes,Ua: NEGATIVE
Nitrite: POSITIVE — AB
Protein, ur: NEGATIVE mg/dL
Specific Gravity, Urine: 1.015 (ref 1.005–1.030)
pH: 6.5 (ref 5.0–8.0)

## 2020-12-24 LAB — VITAMIN D 25 HYDROXY (VIT D DEFICIENCY, FRACTURES): Vit D, 25-Hydroxy: 35.38 ng/mL (ref 30–100)

## 2020-12-24 LAB — RAPID URINE DRUG SCREEN, HOSP PERFORMED
Amphetamines: NOT DETECTED
Barbiturates: NOT DETECTED
Benzodiazepines: NOT DETECTED
Cocaine: NOT DETECTED
Opiates: NOT DETECTED
Tetrahydrocannabinol: NOT DETECTED

## 2020-12-24 LAB — PROTIME-INR
INR: 1.1 (ref 0.8–1.2)
Prothrombin Time: 13.9 seconds (ref 11.4–15.2)

## 2020-12-24 LAB — TSH: TSH: 0.67 u[IU]/mL (ref 0.350–4.500)

## 2020-12-24 LAB — LIPID PANEL
Cholesterol: 133 mg/dL (ref 0–200)
HDL: 51 mg/dL (ref >40)
LDL Cholesterol: 70 mg/dL (ref 0–99)
Total CHOL/HDL Ratio: 2.6 RATIO
Triglycerides: 62 mg/dL (ref <150)
VLDL: 12 mg/dL (ref 0–40)

## 2020-12-24 LAB — PHOSPHORUS: Phosphorus: 3.4 mg/dL (ref 2.5–4.6)

## 2020-12-24 LAB — MAGNESIUM: Magnesium: 1.9 mg/dL (ref 1.7–2.4)

## 2020-12-24 LAB — APTT: aPTT: 24 seconds (ref 24–36)

## 2020-12-24 LAB — CK: Total CK: 532 U/L — ABNORMAL HIGH (ref 49–397)

## 2020-12-24 LAB — ACETAMINOPHEN LEVEL: Acetaminophen (Tylenol), Serum: <10 ug/mL — ABNORMAL LOW (ref 10–30)

## 2020-12-24 MED ORDER — ASPIRIN 300 MG RE SUPP
RECTAL | Status: AC
  Filled 2020-12-24: qty 1

## 2020-12-24 MED ORDER — ASPIRIN EC 81 MG PO TBEC: 81.0000 mg | DELAYED_RELEASE_TABLET | Freq: Every day | ORAL | Status: DC

## 2020-12-24 MED ORDER — ACETAMINOPHEN 325 MG PO TABS
650.0000 mg | ORAL_TABLET | Freq: Four times a day (QID) | ORAL | Status: DC | PRN
  Administered 2020-12-29: 21:00:00 650 mg via ORAL
  Filled 2020-12-24: qty 2

## 2020-12-24 MED ORDER — DONEPEZIL HCL 10 MG PO TABS
10.0000 mg | ORAL_TABLET | Freq: Every day | ORAL | Status: DC
  Administered 2020-12-26 – 2021-01-06 (×12): 10 mg via ORAL
  Filled 2020-12-24 (×13): qty 1

## 2020-12-24 MED ORDER — METOPROLOL TARTRATE 25 MG PO TABS: 25.0000 mg | ORAL_TABLET | Freq: Two times a day (BID) | ORAL | Status: DC

## 2020-12-24 MED ORDER — ASPIRIN 300 MG RE SUPP
150.0000 mg | Freq: Every day | RECTAL | Status: DC
  Administered 2020-12-24: 150 mg via RECTAL

## 2020-12-24 MED ORDER — ACETAMINOPHEN 650 MG RE SUPP: 650.0000 mg | Freq: Four times a day (QID) | RECTAL | Status: DC | PRN

## 2020-12-24 MED ORDER — METOPROLOL TARTRATE 5 MG/5ML IV SOLN
5.0000 mg | INTRAVENOUS | Status: AC | PRN
  Administered 2020-12-24 (×3): 5 mg via INTRAVENOUS

## 2020-12-24 MED ORDER — METOPROLOL TARTRATE 5 MG/5ML IV SOLN
5.0000 mg | Freq: Four times a day (QID) | INTRAVENOUS | Status: AC
  Administered 2020-12-25 (×4): 5 mg via INTRAVENOUS
  Filled 2020-12-24 (×4): qty 5

## 2020-12-24 MED ORDER — ONDANSETRON HCL 4 MG PO TABS: 4.0000 mg | ORAL_TABLET | Freq: Four times a day (QID) | ORAL | Status: DC | PRN

## 2020-12-24 MED ORDER — ROSUVASTATIN CALCIUM 5 MG PO TABS
5.0000 mg | ORAL_TABLET | Freq: Every day | ORAL | Status: DC
  Administered 2020-12-26 – 2021-01-06 (×12): 5 mg via ORAL
  Filled 2020-12-24 (×12): qty 1

## 2020-12-24 MED ORDER — LACTATED RINGERS IV SOLN: INTRAVENOUS | Status: AC

## 2020-12-24 MED ORDER — LORAZEPAM 2 MG/ML IJ SOLN
2.0000 mg | Freq: Once | INTRAMUSCULAR | Status: AC
  Administered 2020-12-24: 2 mg via INTRAVENOUS

## 2020-12-24 MED ORDER — SODIUM CHLORIDE 0.9 % IV SOLN
2000.0000 mg | Freq: Once | INTRAVENOUS | Status: AC
  Administered 2020-12-24: 2000 mg via INTRAVENOUS
  Filled 2020-12-24: qty 20

## 2020-12-24 MED ORDER — SODIUM CHLORIDE 0.9% FLUSH
3.0000 mL | Freq: Once | INTRAVENOUS | Status: AC
  Administered 2020-12-24: 3 mL via INTRAVENOUS

## 2020-12-24 MED ORDER — ONDANSETRON HCL 4 MG/2ML IJ SOLN: 4.0000 mg | Freq: Four times a day (QID) | INTRAMUSCULAR | Status: DC | PRN

## 2020-12-24 MED ORDER — LEVETIRACETAM IN NACL 500 MG/100ML IV SOLN
500.0000 mg | Freq: Two times a day (BID) | INTRAVENOUS | Status: DC
  Administered 2020-12-24 – 2020-12-26 (×4): 500 mg via INTRAVENOUS
  Filled 2020-12-24 (×5): qty 100

## 2020-12-24 MED ORDER — ENOXAPARIN SODIUM 40 MG/0.4ML IJ SOSY
40.0000 mg | PREFILLED_SYRINGE | INTRAMUSCULAR | Status: DC
  Administered 2020-12-24: 40 mg via SUBCUTANEOUS

## 2020-12-24 MED ORDER — IOHEXOL 350 MG/ML SOLN
100.0000 mL | Freq: Once | INTRAVENOUS | Status: AC | PRN
  Administered 2020-12-24: 100 mL via INTRAVENOUS

## 2020-12-24 MED ORDER — INSULIN ASPART 100 UNIT/ML IJ SOLN
0.0000 [IU] | INTRAMUSCULAR | Status: DC
  Administered 2020-12-24 – 2020-12-26 (×4): 2 [IU] via SUBCUTANEOUS
  Administered 2020-12-26: 3 [IU] via SUBCUTANEOUS
  Administered 2020-12-27: 2 [IU] via SUBCUTANEOUS
  Administered 2020-12-29: 17:00:00 1 [IU] via SUBCUTANEOUS
  Administered 2020-12-29: 22:00:00 2 [IU] via SUBCUTANEOUS
  Administered 2020-12-29 – 2020-12-31 (×2): 3 [IU] via SUBCUTANEOUS
  Administered 2020-12-31 – 2021-01-03 (×8): 2 [IU] via SUBCUTANEOUS

## 2020-12-24 MED ORDER — SERTRALINE HCL 25 MG PO TABS
25.0000 mg | ORAL_TABLET | Freq: Every day | ORAL | Status: DC
  Administered 2020-12-26 – 2021-01-06 (×12): 25 mg via ORAL
  Filled 2020-12-24 (×12): qty 1

## 2020-12-24 MED ORDER — NITROGLYCERIN 0.4 MG SL SUBL: 0.4000 mg | SUBLINGUAL_TABLET | SUBLINGUAL | Status: DC | PRN

## 2020-12-24 MED ORDER — LORAZEPAM 2 MG/ML IJ SOLN
1.0000 mg | Freq: Once | INTRAMUSCULAR | Status: AC
  Administered 2020-12-24: 1 mg via INTRAVENOUS

## 2020-12-24 MED ORDER — SENNOSIDES-DOCUSATE SODIUM 8.6-50 MG PO TABS: 1.0000 | ORAL_TABLET | Freq: Every evening | ORAL | Status: DC | PRN

## 2020-12-24 NOTE — Consult Note (Signed)
NEURO HOSPITALIST CONSULT NOTE   Requestig physician: Dr. Vanita Panda  Reason for Consult: Acute onset of aphasia  History obtained from:  EMS and Chart     HPI:                                                                                                                                          Ralph West is an 87 y.o. male with a PMHx of anxiety, DM, HLD, HTN, nephrolithiasis, NSTEMI and dementia, presenting from home via EMS with acute onset of agitation, confusion and dysphasia.   Initially found by wife at approximately 8:50 AM in bed grunting, moaning and flailing around, not following instructions, no coordinated movement. LKN 9:30 last night when they went to sleep. EMS was called and on arrival he was in sinus tachycardia at a rate of 150, no diaphoresis, pallor or cyanosis. EMS also noted awake agitated state with garbled speech and inability to follow commands. He did say "I'm squeezing your hands" when in fact he was not squeezing EMT's hands. Unable to walk - had to be carried out by EMS. No jerking or twitching seen. Patient fell asleep on ride in ambulance, with EMS needing to wake him up frequently. No definite weakness or facial droop noted by EMS; not leaning to one side.   Uses incontinence pads at baseline. He normally walks on his own, is interactive and speaks fluently with wife.   On ASA daily.   Past Medical History:  Diagnosis Date   Anxiety, generalized    Diabetes mellitus without complication ([place])    Hernia, umbilical    Hyperlipidemia    Hypertension    Kidney stones   NSTEMI Dementia  Past Surgical History:  Procedure Laterality Date   CHOLECYSTECTOMY     INGUINAL HERNIA REPAIR     KIDNEY STONE SURGERY      Family History  Problem Relation Age of Onset   Alzheimer's disease Sister    Cancer Brother             Social History:  reports that he has quit smoking. He has never used smokeless tobacco. He reports that he does  not currently use alcohol. No history on file for drug use.  No Known Allergies  HOME MEDICATIONS:  No current facility-administered medications on file prior to encounter.   Current Outpatient Medications on File Prior to Encounter  Medication Sig Dispense Refill   aspirin EC 81 MG tablet Take 81 mg by mouth daily.     Calcium Carbonate-Vitamin D (OYSTER SHELL CALCIUM/D PO) Take 1 tablet by mouth in the morning and at bedtime.     donepezil (ARICEPT) 10 MG tablet Take 10 mg by mouth daily.     metoprolol tartrate (LOPRESSOR) 25 MG tablet Take 25 mg by mouth 2 (two) times daily.     multivitamin-iron-minerals-folic acid (CENTRUM) chewable tablet Chew 1 tablet by mouth daily.     nitroGLYCERIN (NITROSTAT) 0.4 MG SL tablet Place 1 tablet (0.4 mg total) under the tongue every 5 (five) minutes as needed. 30 tablet 3   rosuvastatin (CRESTOR) 5 MG tablet Take 5 mg by mouth daily.     saw palmetto 160 MG capsule Take 160 mg by mouth daily.      sertraline (ZOLOFT) 25 MG tablet Take 25 mg by mouth daily.       ROS:                                                                                                                                       Unable to obtain due to AMS.   Height 5\' 5"  (1.651 m), weight 75 kg. BP (!) 149/97   Pulse (!) 133   Temp (!) 97.5 F (36.4 C) (Oral)   Resp (!) 23   Ht 5\' 5"  (1.651 m)   Wt 75 kg   SpO2 100%   BMI 27.51 kg/m    General Examination:                                                                                                       Physical Exam  HEENT-  [place]/AT    Lungs- Respirations unlabored Extremities- Warm and well perfused  Neurological Examination Mental Status: Alert, oriented, thought content appropriate.  Speech fluent without evidence of aphasia.  Able to follow 3 step commands without difficulty. Cranial  Nerves: II: Discs flat bilaterally; Visual fields grossly normal,  III,IV, VI: ptosis not present, extra-ocular motions intact bilaterally pupils equal, round, reactive to light and accommodation V,VII: smile symmetric, facial light touch sensation normal bilaterally VIII: hearing normal bilaterally IX,X: uvula rises symmetrically XI: bilateral shoulder shrug XII: midline tongue extension Motor: Right : Upper extremity   5/5    Left:  Upper extremity   5/5  Lower extremity   5/5     Lower extremity   5/5 Tone and bulk:normal tone throughout; no atrophy noted Sensory: Pinprick and light touch intact throughout, bilaterally Deep Tendon Reflexes: 2+ and symmetric throughout Plantars: Right: downgoing   Left: downgoing Cerebellar: normal finger-to-nose, normal rapid alternating movements and normal heel-to-shin test Gait: normal gait and station   Lab Results: Basic Metabolic Panel: Recent Labs  Lab 12/24/20 0941  NA 139  K 3.8  CL 106  GLUCOSE 200*  BUN 22  CREATININE 0.90    CBC: Recent Labs  Lab 12/24/20 0941  HGB 15.0  HCT 44.0    Cardiac Enzymes: No results for input(s): CKTOTAL, CKMB, CKMBINDEX, TROPONINI in the last 168 hours.  Lipid Panel: No results for input(s): CHOL, TRIG, HDL, CHOLHDL, VLDL, LDLCALC in the last 168 hours.  Imaging: No results found.   Assessment:  87 y.o. male with a PMHx of anxiety, DM, HLD, HTN, nephrolithiasis, NSTEMI and dementia, presenting from home via EMS with acute onset of agitation, confusion and dysphasia. Initially found by wife at approximately 8:50 AM in bed grunting, moaning and flailing around, not following instructions, no coordinated movement. LKN 9:30 last night when they went to sleep. EMS was called and on arrival he was in sinus tachycardia at a rate of 150, no diaphoresis, pallor or cyanosis. EMS also noted awake agitated state with garbled speech and inability to follow commands. He did say "I'm squeezing your  hands" when in fact he was not squeezing EMT's hands. Unable to walk - had to be carried out by EMS. No jerking or twitching seen. Patient fell asleep on ride in ambulance, with EMS needing to wake him up frequently. No definite weakness or facial droop noted by EMS; not leaning to one side. Uses incontinence pads at baseline. He normally walks on his own, is interactive and speaks fluently with wife.  1. Exam reveals expressive and receptive aphasia without focal motor weakness.  2. CT head: There is no acute intracranial hemorrhage or evidence of acute infarction. ASPECT score is 10. Chronic microvascular ischemic changes and chronic small vessel infarcts are noted. 3. CTA head and neck: High-grade stenosis at the right vertebral artery origin. 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 This is likely chronic. Intracranially, the vessel is patent but irregular with up to moderate stenosis.Plaque without hemodynamically significant stenosis at the carotid origins. Moderate to marked stenosis of the proximal supraclinoid right ICA. 4. Perfusion imaging demonstrates no evidence of core infarction. 4 mL of calculated territory at risk localizes to the brainstem and is probably artifactual. 5. EEG: Continuous slow, generalized and maximal left frontotemporal region. This study is suggestive of nonspecific cortical dysfunction in the frontotemporal region.  Additionally there is moderate diffuse encephalopathy, nonspecific etiology.  No seizures or definite epileptiform discharges were seen throughout the recording. 6. DDx: Most likely component of the DDx is acute encephalopathy, most likely toxic/metabolic or due to intercurrent infection. No neck stiffness or fever to suggest a meningitis. First time seizure is also possible. Focal stroke in Wernicke's or Broca's area also on the DDx, but unlikely given negative CTP with artifact.   Recommendations: 1. MRI brain 2. Frequent neuro checks 3.  Continue Aricept 4. Avoid sedating meds 5. IVF 6. Continue ASA and rosuvastatin.  7. Continue donepezil 8. Toxic/infectious/metabolic work up per admitting team 9. EEG.   Addendum: - Seizure activity witnessed by RN in ED. Semiology consisted of head turned  to the right with right gaze deviation, jerking of RUE. The patient was able to speak a few words during the initial portion of the seizure. Patient received 1 mg IV Ativan. Total seizure duration was 20 minutes.  - STAT EEG pending. - Patient examined by neurology attending after seizure, with no clinical seizure activity noted. Patient sleeping with sonorous respirations and appears postictal.  - Loaded with Keppra 2000 mg IV x 1. Will start scheduled Keppra at 500 mg IV BID.   Addendum.  EEG report: Continuous slow, generalized and maximal left frontotemporal region. This study is suggestive of nonspecific cortical dysfunction in the frontotemporal region.  Additionally there is moderate diffuse encephalopathy, nonspecific etiology.  No seizures or definite epileptiform discharges were seen throughout the recording.  80 minutes spent in the emergent neurological evaluation and management of this critically ill patient  Electronically signed: Dr. Kerney Elbe 12/24/2020, 9:47 AM

## 2020-12-24 NOTE — Progress Notes (Signed)
EEG complete - results pending 

## 2020-12-24 NOTE — ED Notes (Signed)
Auscultated resp sounds noted to have significant improvement of rales.

## 2020-12-24 NOTE — ED Notes (Signed)
Pt to CT with this RN.

## 2020-12-24 NOTE — ED Triage Notes (Signed)
Pt bib [facility] from home where pt lives with wife. Wife called EMS this am at 0850 because when they woke up the pt had aphasia and was grunting. Wife states they went to sleep around 2130 last night and everything was normal. Pt does have hx of dementia and incontinence.  Pt presents HoH, aphasia, incont, with a  HR in 150s.

## 2020-12-24 NOTE — Code Documentation (Signed)
Stroke Response Nurse Documentation Code Documentation  Ralph West is a 87 y.o. male arriving to [facility] ED via [facility] on 12/24/2020 with past medical hx of anxiety, diabetes, hyperlipidemia, hypertension, dementia, and myocardial infarction. On aspirin 81 mg daily. Code stroke was activated by EMS.   Patient from home where he was LKW at 2130 12/23/2020 and found by wife in bed this morning around 0830 unresponsive.  Stroke team at the bedside on patient arrival. Labs drawn and patient cleared for CT by Dr. Vanita Panda. Patient to CT with team. NIHSS 11, see documentation for details and code stroke times. Patient with disoriented, not following commands, right facial droop, bilateral leg weakness, Global aphasia , and dysarthria  on exam. The following imaging was completed: CT, CTA head and neck, CTP. Patient is not a candidate for IV Thrombolytic due to out of window. Patient is not a candidate for IR due to no LVO.   Care/Plan: Q2h assessments with stroke workup.   Bedside handoff with ED RN Lysbeth Galas.    Leverne Humbles Stroke Response RN

## 2020-12-24 NOTE — Procedures (Signed)
Patient Name: Ralph West  MRN: [id number]  Epilepsy Attending: Lora Havens  Referring Physician/Provider: Dr Kerney Elbe Date: 12/24/2020 Duration: 30.50 mins  Patient history: 87 year old male presented with acute onset of agitation, confusion and aphasia.  EEG to evaluate for seizure.  Level of alertness: Awake, asleep  AEDs during EEG study: Keppra, Ativan  Technical aspects: This EEG study was done with scalp electrodes positioned according to the 10-20 International system of electrode placement. Electrical activity was acquired at a sampling rate of 500Hz  and reviewed with a high frequency filter of 70Hz  and a low frequency filter of 1Hz . EEG data were recorded continuously and digitally stored.   Description: During awake state, no clear posterior dominant rhythm was seen.  Sleep was characterized by vertex waves, sleep spindles (12 to 14 Hz), maximal frontocentral region.  EEG also showed continuous generalized 5 to 6 Hz theta slowing as well as intermittent sharply contoured 2 to 3 Hz delta slowing in left frontotemporal region.  Hyperventilation and photic stimulation were not performed.     ABNORMALITY - Continuous slow, generalized and maximal left frontotemporal region  IMPRESSION: This study is suggestive of nonspecific cortical dysfunction in the frontotemporal region.  Additionally there is moderate diffuse encephalopathy, nonspecific etiology.  No seizures or definite epileptiform discharges were seen throughout the recording.  [name] [name]

## 2020-12-24 NOTE — ED Notes (Signed)
Patient transported to MRI 

## 2020-12-24 NOTE — H&P (Addendum)
Date: 12/24/2020               Patient Name:  Ralph West MRN: [id number]  DOB: July 04, 1932 Age / Sex: 87 y.o., male   PCP: Townsend Roger, MD         Medical Service: Internal Medicine Teaching Service         Attending Physician: Dr. Aldine Contes, MD    First Contact: Dr. Jeanice Lim, MD Pager: [phone]  Second Contact: Dr. Coy Saunas, MD Pager: [phone]       After Hours (After 5p/  First Contact Pager: [phone]  weekends / holidays): Second Contact Pager: [phone]   Chief Complaint: Aphasia  History of Present Illness: Ralph West is an 87 year old male with a past medical history of anxiety, stable angina, type II diabetes, hyperlipidemia, hypertension, Alzheimer dementia, and myocardial infarction on ASA presented to the ED with cc of aphasia.  He lives with his wife, she states that patient was well until 830AM when they woke up she came into his room and noticed he was slumped in bed.  She went over to speak with him and noticed drooling from his mouth and he was grunting.  She stated that his speech was intangible.  His body appeared limp. She then called the ambulance immediately.  Code stroke was activated by EMS.  On arrival, stroke team was at bedside.  Patient was noted to be disoriented, not following commands, right facial droop, bilateral leg weakness, global aphasia and dysarthria was noted on exam. During further assessment by ED nurse around 11 AM, a witnessed focal seizure-like activity was noted.  Rhythmic jerking on the right with tachypneic breathing.  Patient was satting at 99%.  Seizure-like activity lasted for about 30 seconds and shortly returned with reduced intensity.  Overall, seizure lasted for about 20 minutes.  Ativan 1 mg was administered.  After that, no further seizure activity witnessed.  Neurology was consulted.  Patient's wife states that patient has a history of jerking-like movements that began 1 year ago.  Patient has never been worked up for  seizure and does not take any antiepileptic medications.  She states that the jerking-like movements will occur randomly and self resolve.  She denies any recent falls.    She went on to state that patient has a history of stable angina relieved with nitroglycerin.  Last use of nitroglycerin was yesterday x1.  Wife states that patient was in his usual state of health otherwise.  She denies sick contacts.  She denies noticing patient experiencing fevers, chills, SOB, N/V,  or constipation.  However she reports decreased oral intake (at baseline patient does not drink water often) and frequent urination.  Patient also has diarrhea intermittently at baseline.   ED course: Code stroke initiated; CT head, CT angio head and neck ordered.  Initial labs include coagulation studies, CBC, CMP, i-STAT CHEM, and respiratory panel.   Meds:  Trazodone 50mg  at bedtime Donepezil 10mg  at bedtime Metoprolol tartrate 25 mg twice daily Sertraline 25 mg daily Rosuvastatin 5 mg daily Nitroglycerin 0.4 as needed No outpatient medications have been marked as taking for the 12/24/20 encounter [facility] Encounter).     Allergies: Allergies as of 12/24/2020   (No Known Allergies)   Past Medical History:  Diagnosis Date   Anxiety, generalized    Diabetes mellitus without complication ([place])    Hernia, umbilical    Hyperlipidemia    Hypertension    Kidney stones     Family History:  Mother and Father both deceased and have a hx of dementia; Patient is one of 11 children. Other siblings have dementia  Social History: Patient is tired and lives with his wife.  He previously worked in [employer].  Patient has a remote history of smoking for 20 years 1 pack/day but has since quit for over 30 years.  Patient also has a history of chewing tobacco for about 10 years but has since quit 20 years ago.  Patient does not consume alcohol or use illicit drugs.  Patient attends memory daycare twice a week.  Review of  Systems: A complete ROS was negative except as per HPI.   Physical Exam: Blood pressure (!) 143/86, pulse (!) 112, temperature (!) 97.5 F (36.4 C), temperature source Oral, resp. rate 14, height 5\' 5"  (1.651 m), weight 75 kg, SpO2 100 %. Physical Exam Constitutional:      General: He is sleeping.     Interventions: Nasal cannula in place.  HENT:     Head: Normocephalic and atraumatic.  Eyes:     Comments: Sluggish reaction to light bilaterally  Cardiovascular:     Rate and Rhythm: Tachycardia present.     Pulses:          Dorsalis pedis pulses are 1+ on the right side and 1+ on the left side.     Heart sounds: Normal heart sounds.     Comments: Pitting edema of the ankle Pulmonary:     Effort: Pulmonary effort is normal.     Breath sounds: Wheezing and rales present.     Comments: Auscultated anteriorly due to somnolence Abdominal:     General: Abdomen is flat.     Palpations: Abdomen is soft.  Musculoskeletal:     Right lower leg: 1+ Pitting Edema present.     Left lower leg: 1+ Pitting Edema present.  Skin:    General: Skin is warm and dry.  Neurological:     Comments: Opened eyes intermittently to verbal and physical stimuli Unable to answer any question Mumbled a few words and drifted back to sleep     EKG: personally reviewed my interpretation is sinus tachycardia with left axis deviation  CXR: personally reviewed my interpretation is pending  CT Head Wo Contrast  Result Date: 12/24/2020 CLINICAL DATA:  Neurologic deficit. EXAM: CT HEAD WITHOUT CONTRAST TECHNIQUE: Contiguous axial images were obtained from the base of the skull through the vertex without intravenous contrast. COMPARISON:  CT head dated December 24, 2020 FINDINGS: Brain: Chronic white matter ischemic change. 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 No evidence of acute infarction, hemorrhage, hydrocephalus, extra-axial collection or mass lesion/mass effect. Vascular: Diffuse vascular hyperdensity is  likely related to prior CTA. Skull: Normal. Negative for fracture or focal lesion. Sinuses/Orbits: No acute finding. Other: None. IMPRESSION: No acute intracranial abnormality. Electronically Signed   By: Yetta Glassman M.D.   On: 12/24/2020 12:22   EEG adult  Result Date: 12/24/2020 Lora Havens, MD     12/24/2020  1:35 PM Patient Name: Ralph West MRN: [id number] Epilepsy Attending: Lora Havens Referring Physician/Provider: Dr Kerney Elbe Date: 12/24/2020 Duration: 30.50 mins Patient history: 87 year old male presented with acute onset of agitation, confusion and aphasia.  EEG to evaluate for seizure. Level of alertness: Awake, asleep AEDs during EEG study: Keppra, Ativan Technical aspects: This EEG study was done with scalp electrodes positioned according to the 10-20 International system of electrode placement. Electrical activity was acquired at a sampling rate of 500Hz  and  reviewed with a high frequency filter of 70Hz  and a low frequency filter of 1Hz . EEG data were recorded continuously and digitally stored. Description: During awake state, no clear posterior dominant rhythm was seen.  Sleep was characterized by vertex waves, sleep spindles (12 to 14 Hz), maximal frontocentral region.  EEG also showed continuous generalized 5 to 6 Hz theta slowing as well as intermittent sharply contoured 2 to 3 Hz delta slowing in left frontotemporal region.  Hyperventilation and photic stimulation were not performed.   ABNORMALITY - Continuous slow, generalized and maximal left frontotemporal region IMPRESSION: This study is suggestive of nonspecific cortical dysfunction in the frontotemporal region.  Additionally there is moderate diffuse encephalopathy, nonspecific etiology.  No seizures or definite epileptiform discharges were seen throughout the recording. Lora Havens   CT HEAD CODE STROKE WO CONTRAST  Result Date: 12/24/2020 CLINICAL DATA:  Neuro deficit, acute, stroke suspected EXAM: CT  HEAD WITHOUT CONTRAST CT ANGIOGRAPHY HEAD AND NECK CT PERFUSION BRAIN TECHNIQUE: Multi detector CT imaging of the head was performed using standard protocol without contrast. Multidetector CT imaging of the head and neck was performed using the standard protocol during bolus administration of intravenous contrast. Multiplanar CT image reconstructions and MIPs were obtained to evaluate the vascular anatomy. Carotid stenosis measurements (when applicable) are obtained utilizing NASCET criteria, using the distal internal carotid diameter as the denominator. Multiphase CT imaging of the brain was performed following IV bolus contrast injection. Subsequent parametric perfusion maps were calculated using RAPID software. CONTRAST:  100 mL Omnipaque 350 COMPARISON:  CT head May 2021, MRI brain October 2019 FINDINGS: CT HEAD FINDINGS Brain: There is no acute intracranial hemorrhage, mass effect, or edema. Gray-white differentiation is preserved. Patchy low-density in the supratentorial white matter is nonspecific but likely reflects similar chronic microvascular ischemic changes. There are chronic small vessel infarcts of the corona radiata and basal ganglia bilaterally. Prominence of the ventricles and sulci reflects parenchymal volume loss. Partially calcified extra-axial mass in the left posterior fossa likely reflects a meningioma similar to the prior study. Vascular: No hyperdense vessel. Skull: Unremarkable. Sinuses/Orbits: No acute abnormality. Other: Mastoid air cells are clear. ASPECTS ([place] Stroke Program Early CT Score) - Ganglionic level infarction (caudate, lentiform nuclei, internal capsule, insula, M1-M3 cortex): 7 - Supraganglionic infarction (M4-M6 cortex): 3 Total score (0-10 with 10 being normal): 10 Review of the MIP images confirms the above findings CTA NECK FINDINGS Aortic arch: Great vessel origins are patent. Right carotid system: Patent. Mixed plaque at the bifurcation and proximal internal  carotid with minimal stenosis. Left carotid system: Patent. Calcified plaque at the proximal internal carotid causing less than 50% stenosis. Vertebral arteries: Patent dominant left vertebral artery. Plaque at the right vertebral origin causing marked stenosis. Diminished flow within the right vertebral artery with loss of enhancement at the C2 level to the skull base. Skeleton: Degenerative changes of the cervical spine. There is a partially calcified central disc extrusion at C4-C5 with marked canal stenosis. Other neck: Unremarkable. Upper chest: No apical lung mass. Review of the MIP images confirms the above findings CTA HEAD FINDINGS Anterior circulation: Intracranial internal carotid arteries are patent. Plaque along the proximal supraclinoid right ICA causes moderate to marked stenosis. Anterior cerebral arteries are patent. Right A1 ACA is congenitally absent. Middle cerebral arteries are patent. Mild noncalcified plaque along the right M1 MCA. Posterior circulation: Intracranial left vertebral artery is patent with calcified plaque causing mild stenosis. The intracranial right vertebral artery is patent but irregular. Focal eccentric  noncalcified plaque causes moderate stenosis. Venous sinuses: As permitted by contrast timing, patent. Review of the MIP images confirms the above findings CT Brain Perfusion Findings: CBF (<30%) Volume: 40mL Perfusion (Tmax>6.0s) volume: 34mL Mismatch Volume: 39mL Infarction Location: Area of territory at risk localizes to the brainstem and is probably artifactual. There is also motion degradation. IMPRESSION: There is no acute intracranial hemorrhage or evidence of acute infarction. ASPECT score is 10. Chronic microvascular ischemic changes and chronic small vessel infarcts. High-grade stenosis at the right vertebral artery origin. 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 This is likely chronic. Intracranially, the vessel is patent but irregular with up to  moderate stenosis. Plaque without hemodynamically significant stenosis at the carotid origins. Moderate to marked stenosis of the proximal supraclinoid right ICA. Perfusion imaging demonstrates no evidence of core infarction. 4 mL of calculated territory at risk localizes to the brainstem and is probably artifactual. Initial results were communicated to Dr. Cheral Marker at 9:57 am on 12/24/2020 by text page via the [facility] messaging system. Electronically Signed   By: Macy Mis M.D.   On: 12/24/2020 10:17   CT ANGIO HEAD NECK W WO CM W PERF (CODE STROKE)  Result Date: 12/24/2020 CLINICAL DATA:  Neuro deficit, acute, stroke suspected EXAM: CT HEAD WITHOUT CONTRAST CT ANGIOGRAPHY HEAD AND NECK CT PERFUSION BRAIN TECHNIQUE: Multi detector CT imaging of the head was performed using standard protocol without contrast. Multidetector CT imaging of the head and neck was performed using the standard protocol during bolus administration of intravenous contrast. Multiplanar CT image reconstructions and MIPs were obtained to evaluate the vascular anatomy. Carotid stenosis measurements (when applicable) are obtained utilizing NASCET criteria, using the distal internal carotid diameter as the denominator. Multiphase CT imaging of the brain was performed following IV bolus contrast injection. Subsequent parametric perfusion maps were calculated using RAPID software. CONTRAST:  100 mL Omnipaque 350 COMPARISON:  CT head May 2021, MRI brain October 2019 FINDINGS: CT HEAD FINDINGS Brain: There is no acute intracranial hemorrhage, mass effect, or edema. Gray-white differentiation is preserved. Patchy low-density in the supratentorial white matter is nonspecific but likely reflects similar chronic microvascular ischemic changes. There are chronic small vessel infarcts of the corona radiata and basal ganglia bilaterally. Prominence of the ventricles and sulci reflects parenchymal volume loss. Partially calcified extra-axial mass in  the left posterior fossa likely reflects a meningioma similar to the prior study. Vascular: No hyperdense vessel. Skull: Unremarkable. Sinuses/Orbits: No acute abnormality. Other: Mastoid air cells are clear. ASPECTS ([place] Stroke Program Early CT Score) - Ganglionic level infarction (caudate, lentiform nuclei, internal capsule, insula, M1-M3 cortex): 7 - Supraganglionic infarction (M4-M6 cortex): 3 Total score (0-10 with 10 being normal): 10 Review of the MIP images confirms the above findings CTA NECK FINDINGS Aortic arch: Great vessel origins are patent. Right carotid system: Patent. Mixed plaque at the bifurcation and proximal internal carotid with minimal stenosis. Left carotid system: Patent. Calcified plaque at the proximal internal carotid causing less than 50% stenosis. Vertebral arteries: Patent dominant left vertebral artery. Plaque at the right vertebral origin causing marked stenosis. Diminished flow within the right vertebral artery with loss of enhancement at the C2 level to the skull base. Skeleton: Degenerative changes of the cervical spine. There is a partially calcified central disc extrusion at C4-C5 with marked canal stenosis. Other neck: Unremarkable. Upper chest: No apical lung mass. Review of the MIP images confirms the above findings CTA HEAD FINDINGS Anterior circulation: Intracranial internal carotid arteries are patent. Plaque along  the proximal supraclinoid right ICA causes moderate to marked stenosis. Anterior cerebral arteries are patent. Right A1 ACA is congenitally absent. Middle cerebral arteries are patent. Mild noncalcified plaque along the right M1 MCA. Posterior circulation: Intracranial left vertebral artery is patent with calcified plaque causing mild stenosis. The intracranial right vertebral artery is patent but irregular. Focal eccentric noncalcified plaque causes moderate stenosis. Venous sinuses: As permitted by contrast timing, patent. Review of the MIP images confirms  the above findings CT Brain Perfusion Findings: CBF (<30%) Volume: 44mL Perfusion (Tmax>6.0s) volume: 72mL Mismatch Volume: 8mL Infarction Location: Area of territory at risk localizes to the brainstem and is probably artifactual. There is also motion degradation. IMPRESSION: There is no acute intracranial hemorrhage or evidence of acute infarction. ASPECT score is 10. Chronic microvascular ischemic changes and chronic small vessel infarcts. High-grade stenosis at the right vertebral artery origin. 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 This is likely chronic. Intracranially, the vessel is patent but irregular with up to moderate stenosis. Plaque without hemodynamically significant stenosis at the carotid origins. Moderate to marked stenosis of the proximal supraclinoid right ICA. Perfusion imaging demonstrates no evidence of core infarction. 4 mL of calculated territory at risk localizes to the brainstem and is probably artifactual. Initial results were communicated to Dr. Cheral Marker at 9:57 am on 12/24/2020 by text page via the [facility] messaging system. Electronically Signed   By: Macy Mis M.D.   On: 12/24/2020 10:17       Assessment & Plan by Problem: Principal Problem:   Acute metabolic encephalopathy   Acute metabolic encephalopathy Patient presented to the ED with acute onset of aphasia witnessed by the wife.  In the ED, Patient was noted to be disoriented, not following commands, right facial droop, bilateral leg weakness, global aphasia and dysarthria.  Initial stroke work-up so far has been negative.  Imaging of the head show no acute intracranial hemorrhage or evidence of acute infarction.  CT angio of the head and neck show high-grade stenosis at the right vertebral artery origin, occlusion of the distal extracranial vertebral artery but likely chronic.  Moderate stenosis of the proximal supraclinoid right ICA.  However, perfusion imaging demonstrates no evidence of acute  infarction.  According to the wife patient was well until this morning.  She denies any recent falls, no recent trauma or injury to the head.  Patient has history of stable angina.  Wife states that during anginal episode, wife would check patient's blood pressure and it would be "elevated".  Wife is patient's POA and ensures that he takes all his medications daily.  Patient is currently on metoprolol 25 mg twice daily and follows a cardiologist regularly.  No recent changes in medications.  Due to no sign of infarction, and initial blood pressure on arrival SBP<160 therefore severe hypertension unlikely etiology.  Wife denies recent sick contacts, COVID/flu negative, patient has been afebrile, however on physical exam lungs auscultated anteriorly revealed wheezing and rhonchi.  Chest x-ray reveals ill-defined of the left lung base, which may reflect atelectasis or aspiration/pneumonia. On CBC, significant for leukocytosis at 12.5.  URI is a possible etiology. Patient's wife did state noticing frequent urination; patient was somnolent and unable to answer questions could not determine if patient experiencing dysuria.  Will collect UA for further analysis.  Patient's wife also states she has noticed decrease fluid intake (more than baseline), dehydration in the setting of elderly can result in encephalopathy.  Suspicion for electrolyte imbalance; CMP reassuring.  Patient is a  diabetic, hyperglycemia can result in symptoms of encephalopathy.  However CBG on arrival to the ED is 204.  Although elevated, not concerning for DKA.  Furthermore anion gap within normal range.  --UA pending --UDS pending --Urine culture pending --Acetaminophen levels pending --Salicylate levels pending --Hemoglobin A1c pending --TSH pending --Lipid panel pending --Vitamin B12 pending --Vitamin D pending --Follow-up with morning procalcitonin pending  Focal seizure Toxic versus infectious versus metabolic In the ED, witnessed  seizure-like activity by ED nurse.  Total seizure duration was 20 minutes per neurology.  Patient appeared postictal.  Patient received 1 mg IV Ativan for seizure cessation.  No further episodes noted.  Neurology following, EEG performed and revealed continuous slow, generalized and maximalleft frontotemporal region. This study is suggestive of nonspecific cortical dysfunction in the frontotemporal region.  Additionally there is moderate diffuse encephalopathy, nonspecific etiology.  No seizures or definite epileptiform discharges were seen throughout the recording.  Suspicion for electrolyte imbalance, however CMP was reassuring.  According to wife, patient has decreased fluid intake and frequent urination.  Infection can result in seizures, will obtain UA and culture to rule out infection.  CBC supports possible infection with leukocytosis at 12.5.  Suspicion for mass occupying lesion of the head or some intracranial abnormality which can result in seizures.  However on imaging of the head, no mass-effect noted; intracranial abnormality causes unlikely. --Neurology following --Loaded with Keppra 2000 mg IV x1 and scheduled for Keppra at 500 mg IV twice daily --CK levels pending --Lipid panel pending --Phosphorus and magnesium levels pending --Acetaminophen levels pending --Salicylate levels pending -- Avoid sedating medications --Frequent neuro checks --Continue ASA 81mg  and rosuvastatin 5mg   Alzheimer's dementia Patient has a strong family history with dementia in both deceased parents and several siblings. --Restart home medication donepezil 10 mg at bedtime  Hyperlipidemia Last lipid panel obtained 1 year ago revealed elevated triglycerides level at 249; total cholesterol 137 and LDL 51. --Restart rosuvastatin 5 mg daily  Hypertension Tachycardia Patient was hypertensive on admission, 155/125.  Patient was also found to be tachycardic.  EKG shows sinus tachycardia with left axis  deviation.  Patient received a dose of labetalol with slight improvement in heart rate.  If tachycardia persist consider consulting cardiology.  Consider obtaining an echocardiogram --Restart metoprolol tartrate 25 mg twice daily  Stable angina Last anginal episode yesterday x1. --Restart nitroglycerin 0.4 as needed   Dispo: Admit patient to Observation with expected length of stay less than 2 midnights.  Signed: Timothy Lasso, MD 12/24/2020, 3:17 PM  Pager: [phone] After 5pm on weekdays and 1pm on weekends: On Call pager: [phone]

## 2020-12-24 NOTE — ED Notes (Signed)
Sz activity stopped at this time

## 2020-12-24 NOTE — ED Notes (Signed)
Sz like activity stoppped for 30 sec then returned at a reduced intensity.

## 2020-12-24 NOTE — ED Provider Notes (Signed)
[facility] EMERGENCY DEPARTMENT Provider Note   CSN: [id number] Arrival date & time: 12/24/20  0626  An emergency department physician performed an initial assessment on this suspected stroke patient at 0935.  History No chief complaint on file.   Ralph West is a 87 y.o. male.  HPI Patient with a history of dementia presents from a nursing facility via EMS as a code stroke. Level 5 caveat secondary to dementia, acuity of condition. Per EMS staff the patient was last seen normal yesterday about 15 hours ago.  He has baseline confusion, but is typically interactive.  Today patient was found less oriented than usual with possible slurred speech and reportedly was not following commands specifically when asked to do so such as grip strength testing for stroke evaluation.  EMS reports tachycardia, hypotension in route.     Past Medical History:  Diagnosis Date   Anxiety, generalized    Diabetes mellitus without complication ([place])    Hernia, umbilical    Hyperlipidemia    Hypertension    Kidney stones     Patient Active Problem List   Diagnosis Date Noted   Anxiety, generalized 07/08/2020   Diabetes mellitus without complication ([place]) 94/85/4627   Hernia, umbilical 03/50/0938   Hyperlipidemia 07/08/2020   Hypertension 07/08/2020   Kidney stones 07/08/2020    Past Surgical History:  Procedure Laterality Date   CHOLECYSTECTOMY     INGUINAL HERNIA REPAIR     KIDNEY STONE SURGERY         Family History  Problem Relation Age of Onset   Alzheimer's disease Sister    Cancer Brother     Social History   Tobacco Use   Smoking status: Former   Smokeless tobacco: Never  Substance Use Topics   Alcohol use: Not Currently    Home Medications Prior to Admission medications   Medication Sig Start Date End Date Taking? Authorizing Provider  aspirin EC 81 MG tablet Take 81 mg by mouth daily.    [provider]  Calcium Carbonate-Vitamin D  (OYSTER SHELL CALCIUM/D PO) Take 1 tablet by mouth in the morning and at bedtime.    [provider]  donepezil (ARICEPT) 10 MG tablet Take 10 mg by mouth daily.    [provider]  metoprolol tartrate (LOPRESSOR) 25 MG tablet Take 25 mg by mouth 2 (two) times daily. 06/23/19   [provider]  multivitamin-iron-minerals-folic acid (CENTRUM) chewable tablet Chew 1 tablet by mouth daily.    [provider]  nitroGLYCERIN (NITROSTAT) 0.4 MG SL tablet Place 1 tablet (0.4 mg total) under the tongue every 5 (five) minutes as needed. 07/23/20 10/21/20  Richardo Priest, MD  rosuvastatin (CRESTOR) 5 MG tablet Take 5 mg by mouth daily. 06/23/19   [provider]  saw palmetto 160 MG capsule Take 160 mg by mouth daily.     [provider]  sertraline (ZOLOFT) 25 MG tablet Take 25 mg by mouth daily.    [provider]    Allergies    Patient has no known allergies.  Review of Systems   Review of Systems  Unable to perform ROS: Dementia   Physical Exam Updated Vital Signs BP (!) 172/114   Pulse (!) 130   Temp (!) 97.5 F (36.4 C) (Oral)   Resp (!) 41   Ht 5\' 5"  (1.651 m)   Wt 75 kg   SpO2 100%   BMI 27.51 kg/m   Physical Exam Vitals and nursing note  reviewed.  Constitutional:      General: He is not in acute distress.    Appearance: He is well-developed. He is ill-appearing. He is not toxic-appearing.  HENT:     Head: Normocephalic and atraumatic.  Eyes:     Conjunctiva/sclera: Conjunctivae normal.  Cardiovascular:     Rate and Rhythm: Regular rhythm. Tachycardia present.  Pulmonary:     Effort: Pulmonary effort is normal. No respiratory distress.     Breath sounds: No stridor.  Abdominal:     General: There is no distension.  Skin:    General: Skin is warm and dry.  Neurological:     Mental Status: He is alert.     Cranial Nerves: Dysarthria present.     Comments: Patient follows commands inconsistently, does move all  extremities spontaneously.  Psychiatric:        Cognition and Memory: Cognition is impaired. Memory is impaired.    ED Results / Procedures / Treatments   Labs (all labs ordered are listed, but only abnormal results are displayed) Labs Reviewed  CBC - Abnormal; Notable for the following components:      Result Value   WBC 12.5 (*)    All other components within normal limits  DIFFERENTIAL - Abnormal; Notable for the following components:   Neutro Abs 10.6 (*)    All other components within normal limits  COMPREHENSIVE METABOLIC PANEL - Abnormal; Notable for the following components:   Glucose, Bld 204 (*)    Calcium 8.8 (*)    Total Protein 6.1 (*)    Albumin 3.4 (*)    AST 44 (*)    ALT 51 (*)    Total Bilirubin 1.3 (*)    All other components within normal limits  I-STAT CHEM 8, ED - Abnormal; Notable for the following components:   Glucose, Bld 200 (*)    Calcium, Ion 1.02 (*)    All other components within normal limits  CBG MONITORING, ED - Abnormal; Notable for the following components:   Glucose-Capillary 197 (*)    All other components within normal limits  PROTIME-INR  APTT    EKG None  Radiology CT HEAD CODE STROKE WO CONTRAST  Result Date: 12/24/2020 CLINICAL DATA:  Neuro deficit, acute, stroke suspected EXAM: CT HEAD WITHOUT CONTRAST CT ANGIOGRAPHY HEAD AND NECK CT PERFUSION BRAIN TECHNIQUE: Multi detector CT imaging of the head was performed using standard protocol without contrast. Multidetector CT imaging of the head and neck was performed using the standard protocol during bolus administration of intravenous contrast. Multiplanar CT image reconstructions and MIPs were obtained to evaluate the vascular anatomy. Carotid stenosis measurements (when applicable) are obtained utilizing NASCET criteria, using the distal internal carotid diameter as the denominator. Multiphase CT imaging of the brain was performed following IV bolus contrast injection. Subsequent  parametric perfusion maps were calculated using RAPID software. CONTRAST:  100 mL Omnipaque 350 COMPARISON:  CT head May 2021, MRI brain October 2019 FINDINGS: CT HEAD FINDINGS Brain: There is no acute intracranial hemorrhage, mass effect, or edema. Gray-white differentiation is preserved. Patchy low-density in the supratentorial white matter is nonspecific but likely reflects similar chronic microvascular ischemic changes. There are chronic small vessel infarcts of the corona radiata and basal ganglia bilaterally. Prominence of the ventricles and sulci reflects parenchymal volume loss. Partially calcified extra-axial mass in the left posterior fossa likely reflects a meningioma similar to the prior study. Vascular: No hyperdense vessel. Skull: Unremarkable. Sinuses/Orbits: No acute abnormality. Other: Mastoid air cells are  clear. ASPECTS ([place] Stroke Program Early CT Score) - Ganglionic level infarction (caudate, lentiform nuclei, internal capsule, insula, M1-M3 cortex): 7 - Supraganglionic infarction (M4-M6 cortex): 3 Total score (0-10 with 10 being normal): 10 Review of the MIP images confirms the above findings CTA NECK FINDINGS Aortic arch: Great vessel origins are patent. Right carotid system: Patent. Mixed plaque at the bifurcation and proximal internal carotid with minimal stenosis. Left carotid system: Patent. Calcified plaque at the proximal internal carotid causing less than 50% stenosis. Vertebral arteries: Patent dominant left vertebral artery. Plaque at the right vertebral origin causing marked stenosis. Diminished flow within the right vertebral artery with loss of enhancement at the C2 level to the skull base. Skeleton: Degenerative changes of the cervical spine. There is a partially calcified central disc extrusion at C4-C5 with marked canal stenosis. Other neck: Unremarkable. Upper chest: No apical lung mass. Review of the MIP images confirms the above findings CTA HEAD FINDINGS Anterior  circulation: Intracranial internal carotid arteries are patent. Plaque along the proximal supraclinoid right ICA causes moderate to marked stenosis. Anterior cerebral arteries are patent. Right A1 ACA is congenitally absent. Middle cerebral arteries are patent. Mild noncalcified plaque along the right M1 MCA. Posterior circulation: Intracranial left vertebral artery is patent with calcified plaque causing mild stenosis. The intracranial right vertebral artery is patent but irregular. Focal eccentric noncalcified plaque causes moderate stenosis. Venous sinuses: As permitted by contrast timing, patent. Review of the MIP images confirms the above findings CT Brain Perfusion Findings: CBF (<30%) Volume: 63mL Perfusion (Tmax>6.0s) volume: 27mL Mismatch Volume: 80mL Infarction Location: Area of territory at risk localizes to the brainstem and is probably artifactual. There is also motion degradation. IMPRESSION: There is no acute intracranial hemorrhage or evidence of acute infarction. ASPECT score is 10. Chronic microvascular ischemic changes and chronic small vessel infarcts. High-grade stenosis at the right vertebral artery origin. 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 This is likely chronic. Intracranially, the vessel is patent but irregular with up to moderate stenosis. Plaque without hemodynamically significant stenosis at the carotid origins. Moderate to marked stenosis of the proximal supraclinoid right ICA. Perfusion imaging demonstrates no evidence of core infarction. 4 mL of calculated territory at risk localizes to the brainstem and is probably artifactual. Initial results were communicated to Dr. Cheral Marker at 9:57 am on 12/24/2020 by text page via the [facility] messaging system. Electronically Signed   By: Macy Mis M.D.   On: 12/24/2020 10:17   CT ANGIO HEAD NECK W WO CM W PERF (CODE STROKE)  Result Date: 12/24/2020 CLINICAL DATA:  Neuro deficit, acute, stroke suspected EXAM: CT HEAD  WITHOUT CONTRAST CT ANGIOGRAPHY HEAD AND NECK CT PERFUSION BRAIN TECHNIQUE: Multi detector CT imaging of the head was performed using standard protocol without contrast. Multidetector CT imaging of the head and neck was performed using the standard protocol during bolus administration of intravenous contrast. Multiplanar CT image reconstructions and MIPs were obtained to evaluate the vascular anatomy. Carotid stenosis measurements (when applicable) are obtained utilizing NASCET criteria, using the distal internal carotid diameter as the denominator. Multiphase CT imaging of the brain was performed following IV bolus contrast injection. Subsequent parametric perfusion maps were calculated using RAPID software. CONTRAST:  100 mL Omnipaque 350 COMPARISON:  CT head May 2021, MRI brain October 2019 FINDINGS: CT HEAD FINDINGS Brain: There is no acute intracranial hemorrhage, mass effect, or edema. Gray-white differentiation is preserved. Patchy low-density in the supratentorial white matter is nonspecific but likely reflects similar chronic microvascular  ischemic changes. There are chronic small vessel infarcts of the corona radiata and basal ganglia bilaterally. Prominence of the ventricles and sulci reflects parenchymal volume loss. Partially calcified extra-axial mass in the left posterior fossa likely reflects a meningioma similar to the prior study. Vascular: No hyperdense vessel. Skull: Unremarkable. Sinuses/Orbits: No acute abnormality. Other: Mastoid air cells are clear. ASPECTS ([place] Stroke Program Early CT Score) - Ganglionic level infarction (caudate, lentiform nuclei, internal capsule, insula, M1-M3 cortex): 7 - Supraganglionic infarction (M4-M6 cortex): 3 Total score (0-10 with 10 being normal): 10 Review of the MIP images confirms the above findings CTA NECK FINDINGS Aortic arch: Great vessel origins are patent. Right carotid system: Patent. Mixed plaque at the bifurcation and proximal internal carotid  with minimal stenosis. Left carotid system: Patent. Calcified plaque at the proximal internal carotid causing less than 50% stenosis. Vertebral arteries: Patent dominant left vertebral artery. Plaque at the right vertebral origin causing marked stenosis. Diminished flow within the right vertebral artery with loss of enhancement at the C2 level to the skull base. Skeleton: Degenerative changes of the cervical spine. There is a partially calcified central disc extrusion at C4-C5 with marked canal stenosis. Other neck: Unremarkable. Upper chest: No apical lung mass. Review of the MIP images confirms the above findings CTA HEAD FINDINGS Anterior circulation: Intracranial internal carotid arteries are patent. Plaque along the proximal supraclinoid right ICA causes moderate to marked stenosis. Anterior cerebral arteries are patent. Right A1 ACA is congenitally absent. Middle cerebral arteries are patent. Mild noncalcified plaque along the right M1 MCA. Posterior circulation: Intracranial left vertebral artery is patent with calcified plaque causing mild stenosis. The intracranial right vertebral artery is patent but irregular. Focal eccentric noncalcified plaque causes moderate stenosis. Venous sinuses: As permitted by contrast timing, patent. Review of the MIP images confirms the above findings CT Brain Perfusion Findings: CBF (<30%) Volume: 26mL Perfusion (Tmax>6.0s) volume: 75mL Mismatch Volume: 39mL Infarction Location: Area of territory at risk localizes to the brainstem and is probably artifactual. There is also motion degradation. IMPRESSION: There is no acute intracranial hemorrhage or evidence of acute infarction. ASPECT score is 10. Chronic microvascular ischemic changes and chronic small vessel infarcts. High-grade stenosis at the right vertebral artery origin. 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 This is likely chronic. Intracranially, the vessel is patent but irregular with up to moderate  stenosis. Plaque without hemodynamically significant stenosis at the carotid origins. Moderate to marked stenosis of the proximal supraclinoid right ICA. Perfusion imaging demonstrates no evidence of core infarction. 4 mL of calculated territory at risk localizes to the brainstem and is probably artifactual. Initial results were communicated to Dr. Cheral Marker at 9:57 am on 12/24/2020 by text page via the [facility] [address] messaging system. Electronically Signed   By: Macy Mis M.D.   On: 12/24/2020 10:17    Procedures Procedures   Medications Ordered in ED Medications  metoprolol tartrate (LOPRESSOR) injection 5 mg (5 mg Intravenous Given 12/24/20 1117)  sodium chloride flush (NS) 0.9 % injection 3 mL (3 mLs Intravenous Given 12/24/20 1033)  iohexol (OMNIPAQUE) 350 MG/ML injection 100 mL (100 mLs Intravenous Contrast Given 12/24/20 0958)  LORazepam (ATIVAN) injection 1 mg (1 mg Intravenous Given 12/24/20 1108)    ED Course  I have reviewed the triage vital signs and the nursing notes.  Pertinent labs & imaging results that were available during my care of the patient were reviewed by me and considered in my medical decision making (see chart for details).  Update: After  initial CTs were available I reviewed the results, discussed with our neurology colleague. No evidence for acute stroke on these.  Patient is shaking, movement, not a good candidate for MRI currently.  Update: Patient began having rhythmic right-sided motion.  Heart rate went from 150s, now 180s, he became more tachypneic, but was able to respond throughout this.  With consideration of A. fib patient will receive labetalol.  After 1 dose of labetalol heart rate slightly diminished, the patient continues to have shaking, Ativan pending. Has developed rhonchorous respirations, with saturation 90%, requiring increasing amounts of nasal cannula.  11:25 AM Rhythmic motion has essentially stopped, heart rate is now 130s after several doses  of labetalol.  Patient has no known history of A. fib, takes only aspirin, not anticoagulation. Have talked with his wife who is in route to this facility. Cussed this case with our respiratory therapist patient will try high flow nasal cannula as he is not a candidate for BiPAP.   Date: I discussed the patient's presentation with his wife now at bedside.  Patient's seizure-like activity has stopped, though he is now much less interactive than he was on arrival.  Heart rate now essentially normal, respiratory rate improved.  I discussed goals of care with the patient's wife.  She notes that the patient would not want artificial life support, he is DN R, DNI. Update: Patient has received bedside EEG, loaded with Keppra, heart rate remains normal, respiratory function substantially improved from his episode of desaturation with rhonchi.  Some suspicion for that occurring secondary to the patient's prolonged tachycardia.  She notes the patient has no known history of A. fib.  Patient is on aspirin, anticoagulation not currently provided given concern for fall risk.  MDM Rules/Calculators/A&P Adult male with a history of dementia presents with altered mental status.  Patient is initially awake and alert, following some commands, though has some inconsistent deficits.  Initial stroke evaluation does not demonstrate acute lesion.  However, the patient continues to have confusion, develops seizure-like activity in the ED with persistent tachycardia and worsening respiratory status.  Resuscitation with high flow nasal cannula, antiepileptics, IV beta-blocker resolved and substantial improvement in hemodynamic status, but with ongoing seizure-like and postictal phase subsequently, patient required admission, ongoing conversation with the neurology colleagues.  Patient admitted to SDU. Final Clinical Impression(s) / ED Diagnoses Final diagnoses:  Seizure ([place])  New onset a-fib ([place])  Dysarthria  Confusion   MDM Number of Diagnoses or Management Options Confusion: new, needed workup Dysarthria: new, needed workup New onset a-fib [facility]): new, needed workup Seizure ([place]): new, needed workup   Amount and/or Complexity of Data Reviewed Clinical lab tests: ordered and reviewed Tests in the radiology section of CPT: ordered and reviewed Tests in the medicine section of CPT: reviewed and ordered Decide to obtain previous medical records or to obtain history from someone other than the patient: yes Obtain history from someone other than the patient: yes Review and summarize past medical records: yes Discuss the patient with other providers: yes Independent visualization of images, tracings, or specimens: yes  Risk of Complications, Morbidity, and/or Mortality Presenting problems: high Diagnostic procedures: high Management options: high  Critical Care Total time providing critical care: 30-74 minutes (60)  Patient Progress Patient progress: stable     Carmin Muskrat, MD 12/24/20 1348

## 2020-12-24 NOTE — ED Notes (Addendum)
Pt with focal sz like activity. Rhythmic jerking noted on the R with tachypnic breathing. SpO2 99%. Reported to have started by prvious shift RN at 11:00. Lung sounds ascultated and audible with coarse rails bilat. Pt sitting up in high folwers.

## 2020-12-24 NOTE — ED Notes (Signed)
Pt brought back from MRI they stated there were unable to perform the study due to pt being restless

## 2020-12-25 ENCOUNTER — Observation Stay (HOSPITAL_COMMUNITY): Payer: Medicare Other

## 2020-12-25 ENCOUNTER — Encounter (HOSPITAL_COMMUNITY): Payer: Self-pay | Admitting: Internal Medicine

## 2020-12-25 DIAGNOSIS — I959 Hypotension, unspecified: Secondary | ICD-10-CM | POA: Diagnosis present

## 2020-12-25 DIAGNOSIS — J690 Pneumonitis due to inhalation of food and vomit: Secondary | ICD-10-CM | POA: Diagnosis present

## 2020-12-25 DIAGNOSIS — F918 Other conduct disorders: Secondary | ICD-10-CM | POA: Diagnosis not present

## 2020-12-25 DIAGNOSIS — G9341 Metabolic encephalopathy: Secondary | ICD-10-CM | POA: Diagnosis present

## 2020-12-25 DIAGNOSIS — G40909 Epilepsy, unspecified, not intractable, without status epilepticus: Secondary | ICD-10-CM | POA: Diagnosis present

## 2020-12-25 DIAGNOSIS — F0390 Unspecified dementia without behavioral disturbance: Secondary | ICD-10-CM | POA: Diagnosis present

## 2020-12-25 DIAGNOSIS — F05 Delirium due to known physiological condition: Secondary | ICD-10-CM | POA: Diagnosis not present

## 2020-12-25 DIAGNOSIS — R404 Transient alteration of awareness: Secondary | ICD-10-CM | POA: Diagnosis not present

## 2020-12-25 DIAGNOSIS — R410 Disorientation, unspecified: Secondary | ICD-10-CM | POA: Diagnosis present

## 2020-12-25 DIAGNOSIS — R1312 Dysphagia, oropharyngeal phase: Secondary | ICD-10-CM | POA: Diagnosis not present

## 2020-12-25 DIAGNOSIS — J90 Pleural effusion, not elsewhere classified: Secondary | ICD-10-CM | POA: Diagnosis not present

## 2020-12-25 DIAGNOSIS — F419 Anxiety disorder, unspecified: Secondary | ICD-10-CM | POA: Diagnosis not present

## 2020-12-25 DIAGNOSIS — R32 Unspecified urinary incontinence: Secondary | ICD-10-CM | POA: Diagnosis present

## 2020-12-25 DIAGNOSIS — F03911 Unspecified dementia, unspecified severity, with agitation: Secondary | ICD-10-CM | POA: Diagnosis not present

## 2020-12-25 DIAGNOSIS — I2510 Atherosclerotic heart disease of native coronary artery without angina pectoris: Secondary | ICD-10-CM | POA: Diagnosis not present

## 2020-12-25 DIAGNOSIS — R0602 Shortness of breath: Secondary | ICD-10-CM | POA: Diagnosis not present

## 2020-12-25 DIAGNOSIS — E781 Pure hyperglyceridemia: Secondary | ICD-10-CM | POA: Diagnosis present

## 2020-12-25 DIAGNOSIS — R569 Unspecified convulsions: Secondary | ICD-10-CM | POA: Diagnosis not present

## 2020-12-25 DIAGNOSIS — R2689 Other abnormalities of gait and mobility: Secondary | ICD-10-CM | POA: Diagnosis not present

## 2020-12-25 DIAGNOSIS — R471 Dysarthria and anarthria: Secondary | ICD-10-CM | POA: Diagnosis present

## 2020-12-25 DIAGNOSIS — R29810 Facial weakness: Secondary | ICD-10-CM | POA: Diagnosis present

## 2020-12-25 DIAGNOSIS — I700 Atherosclerosis of aorta: Secondary | ICD-10-CM | POA: Diagnosis present

## 2020-12-25 DIAGNOSIS — I252 Old myocardial infarction: Secondary | ICD-10-CM | POA: Diagnosis not present

## 2020-12-25 DIAGNOSIS — R41841 Cognitive communication deficit: Secondary | ICD-10-CM | POA: Diagnosis not present

## 2020-12-25 DIAGNOSIS — E1165 Type 2 diabetes mellitus with hyperglycemia: Secondary | ICD-10-CM | POA: Diagnosis present

## 2020-12-25 DIAGNOSIS — I739 Peripheral vascular disease, unspecified: Secondary | ICD-10-CM | POA: Diagnosis not present

## 2020-12-25 DIAGNOSIS — R456 Violent behavior: Secondary | ICD-10-CM | POA: Diagnosis not present

## 2020-12-25 DIAGNOSIS — F411 Generalized anxiety disorder: Secondary | ICD-10-CM | POA: Diagnosis present

## 2020-12-25 DIAGNOSIS — E119 Type 2 diabetes mellitus without complications: Secondary | ICD-10-CM | POA: Diagnosis not present

## 2020-12-25 DIAGNOSIS — F339 Major depressive disorder, recurrent, unspecified: Secondary | ICD-10-CM | POA: Diagnosis not present

## 2020-12-25 DIAGNOSIS — M810 Age-related osteoporosis without current pathological fracture: Secondary | ICD-10-CM | POA: Diagnosis not present

## 2020-12-25 DIAGNOSIS — I471 Supraventricular tachycardia: Secondary | ICD-10-CM | POA: Diagnosis not present

## 2020-12-25 DIAGNOSIS — E860 Dehydration: Secondary | ICD-10-CM | POA: Diagnosis present

## 2020-12-25 DIAGNOSIS — G4089 Other seizures: Secondary | ICD-10-CM | POA: Diagnosis not present

## 2020-12-25 DIAGNOSIS — I4891 Unspecified atrial fibrillation: Secondary | ICD-10-CM | POA: Diagnosis present

## 2020-12-25 DIAGNOSIS — I208 Other forms of angina pectoris: Secondary | ICD-10-CM | POA: Diagnosis present

## 2020-12-25 DIAGNOSIS — G309 Alzheimer's disease, unspecified: Secondary | ICD-10-CM | POA: Diagnosis present

## 2020-12-25 DIAGNOSIS — M6281 Muscle weakness (generalized): Secondary | ICD-10-CM | POA: Diagnosis not present

## 2020-12-25 DIAGNOSIS — J9811 Atelectasis: Secondary | ICD-10-CM | POA: Diagnosis not present

## 2020-12-25 DIAGNOSIS — I5030 Unspecified diastolic (congestive) heart failure: Secondary | ICD-10-CM | POA: Diagnosis not present

## 2020-12-25 DIAGNOSIS — Z20822 Contact with and (suspected) exposure to covid-19: Secondary | ICD-10-CM | POA: Diagnosis present

## 2020-12-25 DIAGNOSIS — R4701 Aphasia: Secondary | ICD-10-CM | POA: Diagnosis present

## 2020-12-25 DIAGNOSIS — R4702 Dysphasia: Secondary | ICD-10-CM | POA: Diagnosis present

## 2020-12-25 DIAGNOSIS — Z7401 Bed confinement status: Secondary | ICD-10-CM | POA: Diagnosis not present

## 2020-12-25 DIAGNOSIS — E785 Hyperlipidemia, unspecified: Secondary | ICD-10-CM | POA: Diagnosis present

## 2020-12-25 DIAGNOSIS — F0284 Dementia in other diseases classified elsewhere, unspecified severity, with anxiety: Secondary | ICD-10-CM | POA: Diagnosis present

## 2020-12-25 DIAGNOSIS — R2681 Unsteadiness on feet: Secondary | ICD-10-CM | POA: Diagnosis not present

## 2020-12-25 DIAGNOSIS — I10 Essential (primary) hypertension: Secondary | ICD-10-CM | POA: Diagnosis present

## 2020-12-25 DIAGNOSIS — G9340 Encephalopathy, unspecified: Secondary | ICD-10-CM | POA: Diagnosis not present

## 2020-12-25 LAB — CBC WITH DIFFERENTIAL/PLATELET
Abs Immature Granulocytes: 0.05 10*3/uL (ref 0.00–0.07)
Basophils Absolute: 0 10*3/uL (ref 0.0–0.1)
Basophils Relative: 0 %
Eosinophils Absolute: 0.2 10*3/uL (ref 0.0–0.5)
Eosinophils Relative: 2 %
HCT: 40.2 % (ref 39.0–52.0)
Hemoglobin: 13.4 g/dL (ref 13.0–17.0)
Immature Granulocytes: 0 %
Lymphocytes Relative: 18 %
Lymphs Abs: 2.1 10*3/uL (ref 0.7–4.0)
MCH: 33.2 pg (ref 26.0–34.0)
MCHC: 33.3 g/dL (ref 30.0–36.0)
MCV: 99.5 fL (ref 80.0–100.0)
Monocytes Absolute: 0.9 10*3/uL (ref 0.1–1.0)
Monocytes Relative: 8 %
Neutro Abs: 8.3 10*3/uL — ABNORMAL HIGH (ref 1.7–7.7)
Neutrophils Relative %: 72 %
Platelets: 154 10*3/uL (ref 150–400)
RBC: 4.04 MIL/uL — ABNORMAL LOW (ref 4.22–5.81)
RDW: 12.9 % (ref 11.5–15.5)
WBC: 11.6 10*3/uL — ABNORMAL HIGH (ref 4.0–10.5)
nRBC: 0 % (ref 0.0–0.2)

## 2020-12-25 LAB — BASIC METABOLIC PANEL
Anion gap: 7 (ref 5–15)
BUN: 13 mg/dL (ref 8–23)
CO2: 25 mmol/L (ref 22–32)
Calcium: 8.5 mg/dL — ABNORMAL LOW (ref 8.9–10.3)
Chloride: 106 mmol/L (ref 98–111)
Creatinine, Ser: 0.85 mg/dL (ref 0.61–1.24)
GFR, Estimated: >60 mL/min (ref >60)
Glucose, Bld: 117 mg/dL — ABNORMAL HIGH (ref 70–99)
Potassium: 3.5 mmol/L (ref 3.5–5.1)
Sodium: 138 mmol/L (ref 135–145)

## 2020-12-25 LAB — GLUCOSE, CAPILLARY
Glucose-Capillary: 115 mg/dL — ABNORMAL HIGH (ref 70–99)
Glucose-Capillary: 142 mg/dL — ABNORMAL HIGH (ref 70–99)
Glucose-Capillary: 113 mg/dL — ABNORMAL HIGH (ref 70–99)

## 2020-12-25 LAB — CBG MONITORING, ED
Glucose-Capillary: 103 mg/dL — ABNORMAL HIGH (ref 70–99)
Glucose-Capillary: 114 mg/dL — ABNORMAL HIGH (ref 70–99)
Glucose-Capillary: 116 mg/dL — ABNORMAL HIGH (ref 70–99)
Glucose-Capillary: 134 mg/dL — ABNORMAL HIGH (ref 70–99)

## 2020-12-25 LAB — PROCALCITONIN: Procalcitonin: <0.1 ng/mL

## 2020-12-25 LAB — HEMOGLOBIN A1C
Hgb A1c MFr Bld: 6.4 % — ABNORMAL HIGH (ref 4.8–5.6)
Mean Plasma Glucose: 137 mg/dL

## 2020-12-25 MED ORDER — ASPIRIN EC 81 MG PO TBEC
81.0000 mg | DELAYED_RELEASE_TABLET | Freq: Every day | ORAL | Status: DC
  Administered 2020-12-25 – 2021-01-06 (×13): 81 mg via ORAL
  Filled 2020-12-25 (×13): qty 1

## 2020-12-25 MED ORDER — METOPROLOL TARTRATE 25 MG PO TABS: 25.0000 mg | ORAL_TABLET | Freq: Two times a day (BID) | ORAL | Status: DC

## 2020-12-25 MED ORDER — LORAZEPAM 2 MG/ML IJ SOLN
0.5000 mg | Freq: Once | INTRAMUSCULAR | Status: AC
Start: 2020-12-25 — End: 2020-12-25
  Administered 2020-12-25: 0.5 mg via INTRAVENOUS
  Filled 2020-12-25: qty 1

## 2020-12-25 MED ORDER — LACTATED RINGERS IV SOLN
INTRAVENOUS | Status: AC
Start: 2020-12-25 — End: 2020-12-26

## 2020-12-25 MED ORDER — HALOPERIDOL LACTATE 5 MG/ML IJ SOLN
5.0000 mg | Freq: Once | INTRAMUSCULAR | Status: AC
  Administered 2020-12-25: 5 mg via INTRAMUSCULAR
  Filled 2020-12-25: qty 1

## 2020-12-25 MED ORDER — OLANZAPINE 5 MG PO TBDP
5.0000 mg | ORAL_TABLET | Freq: Once | ORAL | Status: AC
  Administered 2020-12-25: 5 mg via ORAL
  Filled 2020-12-25: qty 1

## 2020-12-25 NOTE — Progress Notes (Signed)
Date: 12/25/2020  Patient name: Ralph West  Medical record number: [id number]  Date of birth: 17-May-1932   I have seen and evaluated Ralph West and discussed their care with the Residency Team.  In brief, patient is an 87 year old male with a past medical history of anxiety, angina, type 2 diabetes, hyperlipidemia, hypertension, Alzheimer's dementia and myocardial infarction who presented to the ED with aphasia x1 episode.  Patient was unable to provide a good history secondary to his dementia but states that he feels well.  Per chart, patient lives with his wife who noted that the patient was feeling well until 8:30 AM yesterday when they woke up.  She came to his room and noted that he was slumped in bed.  He was also noted to be drooling from his mouth and was requesting.  Patient's speech was unintelligible and patient appeared limp.  EMS was called who brought the patient to the ED for further evaluation.  While in the ED, patient was noted to have a witnessed focal seizure-like activity with rhythmic jerking on the right with tachypneic breathing.  Per wife, intermittent jerking like movements began approximately 1 year ago he has never been worked up for this nor does he take any medications for this.  No chest pain, shortness of breath, palpitations, no lightheadedness, no abdominal pain, no diarrhea, no focal weakness, no nausea or vomiting, no fevers or chills.  Today, patient was alert but confused.  He was able to answer questions and follow commands appropriately.  He does not know why he is here and did not know that he was in the hospital.  PMHx, Fam Hx, and/or Soc Hx : As per resident admit note  Vitals:   12/25/20 1130 12/25/20 1145  BP: 106/76 114/83  Pulse: 80 85  Resp: 17 (!) 24  Temp:    SpO2: 97% 96%   General: Awake, alert, confused, NAD CVS: Regular rate and rhythm, normal heart sounds Lungs: CTA bilaterally, no wheezes or crackles noted Abdomen: Soft,  nontender, nondistended, normoactive bowel sounds Extremities: No edema noted, nontender to palpation, bilateral hands in mitts Psych: Normal mood and affect HEENT: Normocephalic, atraumatic Skin: Warm and dry   Assessment and Plan: I have seen and evaluated the patient as outlined above. I agree with the formulated Assessment and Plan as detailed in the residents' note, with the following changes:   1.  Acute encephalopathy secondary to seizure disorder: -Patient presented to ED with bilateral extremity weakness, aphasia, right facial droop and had a witnessed focal seizure in the ED.  Per wife, patient has had intermittent episodes of focal seizure-like activity which was never worked up.  I suspect that the patient has underlying seizure disorder and that his altered mental status and weakness were likely secondary to him being in a postictal state. -There was concern for an underlying stroke given weakness and facial droop on initial presentation.  However, work-up including CT head, MRI brain as well as CT angio head and neck showed no evidence of an underlying CVA.  Imaging of his brain did show high-grade stenosis of the right vertebral artery as well as old infarcts but nothing acute. -There was also concern for an underlying infection causing patient's altered mental status given mild leukocytosis up to 12.5 and possible infiltrate on chest x-ray.  However, patient's mental status is currently at his baseline and he was not treated for underlying infection making this less likely.  Patient was also noted to have a normal procalcitonin  level making this less likely. -EEG done yesterday after his seizure showed no evidence of underlying seizure activity but EEG was unable to be recorded during the seizure and I suspect patient did have an actual seizure which was witnessed in the ED -Continue with IV Keppra per neurology -Patient mental status appears to be at baseline currently.  We will  continue to monitor closely -Continue with frequent neurochecks -Continue with aspirin and rosuvastatin as well as donepezil -Patient was noted to have episodes of tachycardia up to the 130s but his heart rate is currently in the 70s to 80s.  I suspect that his elevated heart rates admission was secondary to beta-blocker withdrawal as well as underlying seizure.  Patient was initiated on Lopressor 5 mg IV every 6 hours until he can tolerate oral intake. -SLP follow-up recommendations appreciated.  We will start the patient on a dysphagia 2 diet and transition IV Lopressor to oral metoprolol -Neuro follow-up and recommendations appreciated. -No further work-up at this time.  We will continue to monitor closely  Aldine Contes, MD 11/30/202212:25 PM

## 2020-12-25 NOTE — ED Notes (Signed)
Patient transported to MRI 

## 2020-12-25 NOTE — ED Notes (Signed)
ED TO INPATIENT HANDOFF REPORT  ED Nurse Name and Phone #: Bryson Ha Como Name/Age/Gender Jovita Gamma 87 y.o. male Room/Bed: 019C/019C  Code Status   Code Status: Full Code  Home/SNF/Other Home Patient oriented to: self and place Is this baseline? No   Triage Complete: Triage complete  Chief Complaint Acute metabolic encephalopathy [K93.81]  Triage Note Pt bib [facility] from home where pt lives with wife. Wife called EMS this am at 0850 because when they woke up the pt had aphasia and was grunting. Wife states they went to sleep around 2130 last night and everything was normal. Pt does have hx of dementia and incontinence.  Pt presents [facility], aphasia, incont, with a  HR in 150s.   Allergies No Known Allergies  Level of Care/Admitting Diagnosis ED Disposition     ED Disposition  Admit   Condition  --   Comment  Hospital Area: [facility] [[id number]]  Level of Care: Progressive [102]  Admit to Progressive based on following criteria: NEUROLOGICAL AND NEUROSURGICAL complex patients with significant risk of instability, who do not meet ICU criteria, yet require close observation or frequent assessment (< / = every 2 - 4 hours) with medical / nursing intervention.  May place patient in observation at [facility] or [facility] if equivalent level of care is available:: No  Covid Evaluation: Asymptomatic Screening Protocol (No Symptoms)  Diagnosis: Acute metabolic encephalopathy [[id number]]  Admitting Physician: Aldine Contes [[id number]]  Attending Physician: Aldine Contes [phone]          B Medical/Surgery History Past Medical History:  Diagnosis Date   Anxiety, generalized    Diabetes mellitus without complication ([place])    Hernia, umbilical    Hyperlipidemia    Hypertension    Kidney stones    Past Surgical History:  Procedure Laterality Date   CHOLECYSTECTOMY     INGUINAL HERNIA REPAIR     KIDNEY STONE SURGERY       A IV  Location/Drains/Wounds Patient Lines/Drains/Airways Status     Active Line/Drains/Airways     Name Placement date Placement time Site Days   Peripheral IV 12/24/20 18 G Right Antecubital 12/24/20  1032  Antecubital  1   Peripheral IV 12/24/20 18 G Anterior;Distal;Left Forearm 12/24/20  1033  Forearm  1            Intake/Output Last 24 hours  Intake/Output Summary (Last 24 hours) at 12/25/2020 1131 Last data filed at 12/25/2020 0005 Gross per 24 hour  Intake 370.51 ml  Output --  Net 370.51 ml    Labs/Imaging Results for orders placed or performed during the hospital encounter of 12/24/20 (from the past 48 hour(s))  CBG monitoring, ED     Status: Abnormal   Collection Time: 12/24/20  9:35 AM  Result Value Ref Range   Glucose-Capillary 197 (H) 70 - 99 mg/dL    Comment: Glucose reference range applies only to samples taken after fasting for at least 8 hours.   Comment 1 Notify RN    Comment 2 Document in Chart   Protime-INR     Status: None   Collection Time: 12/24/20  9:38 AM  Result Value Ref Range   Prothrombin Time 13.9 11.4 - 15.2 seconds   INR 1.1 0.8 - 1.2    Comment: (NOTE) INR goal varies based on device and disease states. Performed at [facility], [place] [address], [place], [place] [address]   APTT     Status:  None   Collection Time: 12/24/20  9:38 AM  Result Value Ref Range   aPTT 24 24 - 36 seconds    Comment: Performed at [facility] [address], [place] [address]  CBC     Status: Abnormal   Collection Time: 12/24/20  9:38 AM  Result Value Ref Range   WBC 12.5 (H) 4.0 - 10.5 K/uL   RBC 4.56 4.22 - 5.81 MIL/uL   Hemoglobin 14.9 13.0 - 17.0 g/dL   HCT 44.4 39.0 - 52.0 %   MCV 97.4 80.0 - 100.0 fL   MCH 32.7 26.0 - 34.0 pg   MCHC 33.6 30.0 - 36.0 g/dL   RDW 12.6 11.5 - 15.5 %   Platelets 184 150 - 400 K/uL   nRBC 0.0 0.0 - 0.2 %    Comment: Performed at [facility], [place] [address] [place], [place] [address]   Differential     Status: Abnormal   Collection Time: 12/24/20  9:38 AM  Result Value Ref Range   Neutrophils Relative % 84 %   Neutro Abs 10.6 (H) 1.7 - 7.7 K/uL   Lymphocytes Relative 10 %   Lymphs Abs 1.2 0.7 - 4.0 K/uL   Monocytes Relative 4 %   Monocytes Absolute 0.5 0.1 - 1.0 K/uL   Eosinophils Relative 1 %   Eosinophils Absolute 0.1 0.0 - 0.5 K/uL   Basophils Relative 0 %   Basophils Absolute 0.0 0.0 - 0.1 K/uL   Immature Granulocytes 1 %   Abs Immature Granulocytes 0.06 0.00 - 0.07 K/uL    Comment: Performed at [facility] [address], [place], [place] [address]  Comprehensive metabolic panel     Status: Abnormal   Collection Time: 12/24/20  9:38 AM  Result Value Ref Range   Sodium 138 135 - 145 mmol/L   Potassium 3.6 3.5 - 5.1 mmol/L   Chloride 105 98 - 111 mmol/L   CO2 23 22 - 32 mmol/L   Glucose, Bld 204 (H) 70 - 99 mg/dL    Comment: Glucose reference range applies only to samples taken after fasting for at least 8 hours.   BUN 17 8 - 23 mg/dL   Creatinine, Ser 1.15 0.61 - 1.24 mg/dL   Calcium 8.8 (L) 8.9 - 10.3 mg/dL   Total Protein 6.1 (L) 6.5 - 8.1 g/dL   Albumin 3.4 (L) 3.5 - 5.0 g/dL   AST 44 (H) 15 - 41 U/L   ALT 51 (H) 0 - 44 U/L   Alkaline Phosphatase 75 38 - 126 U/L   Total Bilirubin 1.3 (H) 0.3 - 1.2 mg/dL   GFR, Estimated >60 >60 mL/min    Comment: (NOTE) Calculated using the CKD-EPI Creatinine Equation (2021)    Anion gap 10 5 - 15    Comment: Performed at [facility], [place] [address] [place], [place] [address]  I-stat chem 8, ED     Status: Abnormal   Collection Time: 12/24/20  9:41 AM  Result Value Ref Range   Sodium 139 135 - 145 mmol/L   Potassium 3.8 3.5 - 5.1 mmol/L   Chloride 106 98 - 111 mmol/L   BUN 22 8 - 23 mg/dL   Creatinine, Ser 0.90 0.61 - 1.24 mg/dL   Glucose, Bld 200 (H) 70 - 99 mg/dL    Comment: Glucose reference range applies only to samples taken after fasting for at least 8 hours.   Calcium, Ion 1.02 (  L)  1.15 - 1.40 mmol/L   TCO2 24 22 - 32 mmol/L   Hemoglobin 15.0 13.0 - 17.0 g/dL   HCT 44.0 39.0 - 52.0 %  Resp Panel by RT-PCR (Flu A&B, Covid) Nasopharyngeal Swab     Status: None   Collection Time: 12/24/20 10:27 AM   Specimen: Nasopharyngeal Swab; Nasopharyngeal(NP) swabs in vial transport medium  Result Value Ref Range   SARS Coronavirus 2 by RT PCR NEGATIVE NEGATIVE    Comment: (NOTE) SARS-CoV-2 target nucleic acids are NOT DETECTED.  The SARS-CoV-2 RNA is generally detectable in upper respiratory specimens during the acute phase of infection. The lowest concentration of SARS-CoV-2 viral copies this assay can detect is 138 copies/mL. A negative result does not preclude SARS-Cov-2 infection and should not be used as the sole basis for treatment or other patient management decisions. A negative result may occur with  improper specimen collection/handling, submission of specimen other than nasopharyngeal swab, presence of viral mutation(s) within the areas targeted by this assay, and inadequate number of viral copies(<138 copies/mL). A negative result must be combined with clinical observations, patient history, and epidemiological information. The expected result is Negative.  Fact Sheet for Patients:  EntrepreneurPulse.com.au  Fact Sheet for Healthcare Providers:  IncredibleEmployment.be  This test is no t yet approved or cleared by the [place] FDA and  has been authorized for detection and/or diagnosis of SARS-CoV-2 by FDA under an Emergency Use Authorization (EUA). This EUA will remain  in effect (meaning this test can be used) for the duration of the COVID-19 declaration under Section 564(b)(1) of the Act, 21 U.S.C.section 360bbb-3(b)(1), unless the authorization is terminated  or revoked sooner.       Influenza A by PCR NEGATIVE NEGATIVE   Influenza B by PCR NEGATIVE NEGATIVE    Comment: (NOTE) The Xpert Xpress  SARS-CoV-2/FLU/RSV plus assay is intended as an aid in the diagnosis of influenza from Nasopharyngeal swab specimens and should not be used as a sole basis for treatment. Nasal washings and aspirates are unacceptable for Xpert Xpress SARS-CoV-2/FLU/RSV testing.  Fact Sheet for Patients: EntrepreneurPulse.com.au  Fact Sheet for Healthcare Providers: IncredibleEmployment.be  This test is not yet approved or cleared by the [place] FDA and has been authorized for detection and/or diagnosis of SARS-CoV-2 by FDA under an Emergency Use Authorization (EUA). This EUA will remain in effect (meaning this test can be used) for the duration of the COVID-19 declaration under Section 564(b)(1) of the Act, 21 U.S.C. section 360bbb-3(b)(1), unless the authorization is terminated or revoked.  Performed at [facility], [place] [address] [place], [place] [address]   Salicylate level     Status: Abnormal   Collection Time: 12/24/20  3:00 PM  Result Value Ref Range   Salicylate Lvl <0.0 (L) 7.0 - 30.0 mg/dL    Comment: Performed at [facility] [address], [place], [place] [address]  Acetaminophen level     Status: Abnormal   Collection Time: 12/24/20  3:00 PM  Result Value Ref Range   Acetaminophen (Tylenol), Serum <10 (L) 10 - 30 ug/mL    Comment: (NOTE) Therapeutic concentrations vary significantly. A range of 10-30 ug/mL  may be an effective concentration for many patients. However, some  are best treated at concentrations outside of this range. Acetaminophen concentrations >150 ug/mL at 4 hours after ingestion  and >50 ug/mL at 12 hours after ingestion are often associated with  toxic reactions.  Performed at [facility],  [address], [place], [place] [address]   Urinalysis, Routine w reflex microscopic     Status: Abnormal   Collection Time: 12/24/20  3:02 PM  Result Value Ref Range   Color, Urine YELLOW YELLOW    APPearance CLEAR CLEAR   Specific Gravity, Urine 1.015 1.005 - 1.030   pH 6.5 5.0 - 8.0   Glucose, UA 100 (A) NEGATIVE mg/dL   Hgb urine dipstick TRACE (A) NEGATIVE   Bilirubin Urine NEGATIVE NEGATIVE   Ketones, ur NEGATIVE NEGATIVE mg/dL   Protein, ur NEGATIVE NEGATIVE mg/dL   Nitrite POSITIVE (A) NEGATIVE   Leukocytes,Ua NEGATIVE NEGATIVE    Comment: Performed at [facility] [address] [place], [place] [address]  Urine rapid drug screen (hosp performed)     Status: None   Collection Time: 12/24/20  3:02 PM  Result Value Ref Range   Opiates NONE DETECTED NONE DETECTED   Cocaine NONE DETECTED NONE DETECTED   Benzodiazepines NONE DETECTED NONE DETECTED   Amphetamines NONE DETECTED NONE DETECTED   Tetrahydrocannabinol NONE DETECTED NONE DETECTED   Barbiturates NONE DETECTED NONE DETECTED    Comment: (NOTE) DRUG SCREEN FOR MEDICAL PURPOSES ONLY.  IF CONFIRMATION IS NEEDED FOR ANY PURPOSE, NOTIFY LAB WITHIN 5 DAYS.  LOWEST DETECTABLE LIMITS FOR URINE DRUG SCREEN Drug Class                     Cutoff (ng/mL) Amphetamine and metabolites    1000 Barbiturate and metabolites    200 Benzodiazepine                 355 Tricyclics and metabolites     300 Opiates and metabolites        300 Cocaine and metabolites        300 THC                            50 Performed at [facility], [place] [address] [place], [place] [address]   Urinalysis, Microscopic (reflex)     Status: Abnormal   Collection Time: 12/24/20  3:02 PM  Result Value Ref Range   RBC / HPF 0-5 0 - 5 RBC/hpf   WBC, UA 0-5 0 - 5 WBC/hpf   Bacteria, UA RARE (A) NONE SEEN   Squamous Epithelial / LPF 0-5 0 - 5    Comment: Performed at [facility], [place] [address], [place], [place] [address]  CBG monitoring, ED     Status: Abnormal   Collection Time: 12/24/20  3:54 PM  Result Value Ref Range   Glucose-Capillary 145 (H) 70 - 99 mg/dL    Comment: Glucose reference range applies only to samples taken  after fasting for at least 8 hours.  Magnesium     Status: None   Collection Time: 12/24/20  5:24 PM  Result Value Ref Range   Magnesium 1.9 1.7 - 2.4 mg/dL    Comment: Performed at [facility], [place] [address] [place], [place] [address]  Phosphorus     Status: None   Collection Time: 12/24/20  5:24 PM  Result Value Ref Range   Phosphorus 3.4 2.5 - 4.6 mg/dL    Comment: Performed at [facility], [place] [address], [place],  [address]  CK     Status: Abnormal   Collection Time: 12/24/20  5:24 PM  Result Value Ref Range   Total CK  532 (H) 49 - 397 U/L    Comment: Performed at [facility], [place] [address], [place], [place] [address]  VITAMIN D 25 Hydroxy (Vit-D Deficiency, Fractures)     Status: None   Collection Time: 12/24/20  5:24 PM  Result Value Ref Range   Vit D, 25-Hydroxy 35.38 30 - 100 ng/mL    Comment: (NOTE) Vitamin D deficiency has been defined by the [place] practice guideline as a level of serum 25-OH  vitamin D less than 20 ng/mL (1,2). The Endocrine Society went on to  further define vitamin D insufficiency as a level between 21 and 29  ng/mL (2).  1. IOM (Institute of Medicine). 2010. Dietary reference intakes for  calcium and D. [place]: The [employer]. 2. Holick MF, Binkley Cheswold, Bischoff-Ferrari HA, et al. Evaluation,  treatment, and prevention of vitamin D deficiency: an Endocrine  Society clinical practice guideline, JCEM. 2011 Jul; 96(7): 1911-30.  Performed at [facility], [place] [address], [place], [place] [address]   Vitamin B12     Status: None   Collection Time: 12/24/20  5:24 PM  Result Value Ref Range   Vitamin B-12 882 180 - 914 pg/mL    Comment: (NOTE) This assay is not validated for testing neonatal or myeloproliferative syndrome specimens for Vitamin B12 levels. Performed at [facility], [place] [address], [place], [place] [address]   TSH     Status:  None   Collection Time: 12/24/20  5:25 PM  Result Value Ref Range   TSH 0.670 0.350 - 4.500 uIU/mL    Comment: Performed by a 3rd Generation assay with a functional sensitivity of <=0.01 uIU/mL. Performed at [facility], [place] [address], [place], [place] [address]   Hemoglobin A1c     Status: Abnormal   Collection Time: 12/24/20  5:26 PM  Result Value Ref Range   Hgb A1c MFr Bld 6.4 (H) 4.8 - 5.6 %    Comment: (NOTE)         Prediabetes: 5.7 - 6.4         Diabetes: >6.4         Glycemic control for adults with diabetes: <7.0    Mean Plasma Glucose 137 mg/dL    Comment: (NOTE) Performed At: [facility] [place], [place] [id number] Rush Farmer MD [id number]   Lipid panel     Status: None   Collection Time: 12/24/20  5:29 PM  Result Value Ref Range   Cholesterol 133 0 - 200 mg/dL   Triglycerides 62 <150 mg/dL   HDL 51 >40 mg/dL   Total CHOL/HDL Ratio 2.6 RATIO   VLDL 12 0 - 40 mg/dL   LDL Cholesterol 70 0 - 99 mg/dL    Comment:        Total Cholesterol/HDL:CHD Risk Coronary Heart Disease Risk Table                     Men   Women  1/2 Average Risk   3.4   3.3  Average Risk       5.0   4.4  2 X Average Risk   9.6   7.1  3 X Average Risk  23.4   11.0        Use the calculated Patient Ratio above and the CHD Risk Table to determine the patient's CHD Risk.  ATP III CLASSIFICATION (LDL):  <100     mg/dL   Optimal  100-129  mg/dL   Near or Above                    Optimal  130-159  mg/dL   Borderline  160-189  mg/dL   High  >190     mg/dL   Very High Performed at [facility] [address], [place], [place] [address]   CBG monitoring, ED     Status: Abnormal   Collection Time: 12/24/20  7:37 PM  Result Value Ref Range   Glucose-Capillary 104 (H) 70 - 99 mg/dL    Comment: Glucose reference range applies only to samples taken after fasting for at least 8 hours.  CBG monitoring, ED     Status: Abnormal   Collection Time:  12/25/20 12:08 AM  Result Value Ref Range   Glucose-Capillary 103 (H) 70 - 99 mg/dL    Comment: Glucose reference range applies only to samples taken after fasting for at least 8 hours.  CBG monitoring, ED     Status: Abnormal   Collection Time: 12/25/20  4:02 AM  Result Value Ref Range   Glucose-Capillary 114 (H) 70 - 99 mg/dL    Comment: Glucose reference range applies only to samples taken after fasting for at least 8 hours.  Procalcitonin - Baseline     Status: None   Collection Time: 12/25/20  4:38 AM  Result Value Ref Range   Procalcitonin <0.10 ng/mL    Comment:        Interpretation: PCT (Procalcitonin) <= 0.5 ng/mL: Systemic infection (sepsis) is not likely. Local bacterial infection is possible. (NOTE)       Sepsis PCT Algorithm           Lower Respiratory Tract                                      Infection PCT Algorithm    ----------------------------     ----------------------------         PCT < 0.25 ng/mL                PCT < 0.10 ng/mL          Strongly encourage             Strongly discourage   discontinuation of antibiotics    initiation of antibiotics    ----------------------------     -----------------------------       PCT 0.25 - 0.50 ng/mL            PCT 0.10 - 0.25 ng/mL               OR       >80% decrease in PCT            Discourage initiation of                                            antibiotics      Encourage discontinuation           of antibiotics    ----------------------------     -----------------------------         PCT >= 0.50 ng/mL  PCT 0.26 - 0.50 ng/mL               AND        <80% decrease in PCT             Encourage initiation of                                             antibiotics       Encourage continuation           of antibiotics    ----------------------------     -----------------------------        PCT >= 0.50 ng/mL                  PCT > 0.50 ng/mL               AND         increase in PCT                   Strongly encourage                                      initiation of antibiotics    Strongly encourage escalation           of antibiotics                                     -----------------------------                                           PCT <= 0.25 ng/mL                                                 OR                                        > 80% decrease in PCT                                      Discontinue / Do not initiate                                             antibiotics  Performed at [facility], [address], [place], [place] [address]   CBC with Differential/Platelet     Status: Abnormal   Collection Time: 12/25/20  4:38 AM  Result Value Ref Range   WBC 11.6 (H) 4.0 - 10.5 K/uL   RBC 4.04 (L) 4.22 - 5.81 MIL/uL   Hemoglobin 13.4 13.0 - 17.0 g/dL   HCT 40.2 39.0 - 52.0 %   MCV 99.5 80.0 - 100.0 fL  MCH 33.2 26.0 - 34.0 pg   MCHC 33.3 30.0 - 36.0 g/dL   RDW 12.9 11.5 - 15.5 %   Platelets 154 150 - 400 K/uL   nRBC 0.0 0.0 - 0.2 %   Neutrophils Relative % 72 %   Neutro Abs 8.3 (H) 1.7 - 7.7 K/uL   Lymphocytes Relative 18 %   Lymphs Abs 2.1 0.7 - 4.0 K/uL   Monocytes Relative 8 %   Monocytes Absolute 0.9 0.1 - 1.0 K/uL   Eosinophils Relative 2 %   Eosinophils Absolute 0.2 0.0 - 0.5 K/uL   Basophils Relative 0 %   Basophils Absolute 0.0 0.0 - 0.1 K/uL   Immature Granulocytes 0 %   Abs Immature Granulocytes 0.05 0.00 - 0.07 K/uL    Comment: Performed at [facility] [address] [place], [place] [address]  Basic metabolic panel     Status: Abnormal   Collection Time: 12/25/20  4:38 AM  Result Value Ref Range   Sodium 138 135 - 145 mmol/L   Potassium 3.5 3.5 - 5.1 mmol/L   Chloride 106 98 - 111 mmol/L   CO2 25 22 - 32 mmol/L   Glucose, Bld 117 (H) 70 - 99 mg/dL    Comment: Glucose reference range applies only to samples taken after fasting for at least 8 hours.   BUN 13 8 - 23 mg/dL   Creatinine, Ser 0.85 0.61 - 1.24 mg/dL    Calcium 8.5 (L) 8.9 - 10.3 mg/dL   GFR, Estimated >60 >60 mL/min    Comment: (NOTE) Calculated using the CKD-EPI Creatinine Equation (2021)    Anion gap 7 5 - 15    Comment: Performed at [facility] [address], [place], [place] [address]  CBG monitoring, ED     Status: Abnormal   Collection Time: 12/25/20  7:56 AM  Result Value Ref Range   Glucose-Capillary 116 (H) 70 - 99 mg/dL    Comment: Glucose reference range applies only to samples taken after fasting for at least 8 hours.   CT Head Wo Contrast  Result Date: 12/24/2020 CLINICAL DATA:  Neurologic deficit. EXAM: CT HEAD WITHOUT CONTRAST TECHNIQUE: Contiguous axial images were obtained from the base of the skull through the vertex without intravenous contrast. COMPARISON:  CT head dated December 24, 2020 FINDINGS: Brain: Chronic white matter ischemic change. 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 No evidence of acute infarction, hemorrhage, hydrocephalus, extra-axial collection or mass lesion/mass effect. Vascular: Diffuse vascular hyperdensity is likely related to prior CTA. Skull: Normal. Negative for fracture or focal lesion. Sinuses/Orbits: No acute finding. Other: None. IMPRESSION: No acute intracranial abnormality. Electronically Signed   By: Yetta Glassman M.D.   On: 12/24/2020 12:22   MR BRAIN WO CONTRAST  Result Date: 12/25/2020 CLINICAL DATA:  Encephalopathy EXAM: MRI HEAD WITHOUT CONTRAST TECHNIQUE: Multiplanar, multiecho pulse sequences of the brain and surrounding structures were obtained without intravenous contrast. COMPARISON:  11/08/2017 FINDINGS: Brain: No acute infarct, mass effect or extra-axial collection. Single focus of left parietal periventricular chronic microhemorrhage. There is multifocal hyperintense T2-weighted signal within the white matter. Generalized volume loss without a clear lobar predilection. The midline structures are normal. Unchanged left posterior fossa meningioma. Vascular: Chronic  abnormal right vertebral artery flow void. Skull and upper cervical spine: Normal calvarium and skull base. Visualized upper cervical spine and soft tissues are normal. Sinuses/Orbits:No paranasal sinus fluid levels or advanced mucosal thickening. No mastoid or middle ear effusion. Normal orbits.  IMPRESSION: 1. No acute intracranial abnormality. 2. Generalized volume loss and findings of chronic microvascular ischemia. Electronically Signed   By: Ulyses Jarred M.D.   On: 12/25/2020 03:25   DG CHEST PORT 1 VIEW  Result Date: 12/24/2020 CLINICAL DATA:  Dyspnea. EXAM: PORTABLE CHEST 1 VIEW COMPARISON:  Chest radiograph 06/11/2019. FINDINGS: Heart size within normal limits. Aortic atherosclerosis. Ill-defined opacity within the left lung base. No appreciable airspace consolidation within the right lung. No evidence of pleural effusion or pneumothorax. No acute bony abnormality identified. Degenerative changes of the spine. IMPRESSION: Ill-defined opacity within the left lung base, which may reflect atelectasis or aspiration/pneumonia. Aortic Atherosclerosis (ICD10-I70.0). Electronically Signed   By: Kellie Simmering D.O.   On: 12/24/2020 15:57   EEG adult  Result Date: 12/24/2020 Lora Havens, MD     12/24/2020  1:35 PM Patient Name: Ura Hausen MRN: [id number] Epilepsy Attending: Lora Havens Referring Physician/Provider: Dr Kerney Elbe Date: 12/24/2020 Duration: 30.50 mins Patient history: 87 year old male presented with acute onset of agitation, confusion and aphasia.  EEG to evaluate for seizure. Level of alertness: Awake, asleep AEDs during EEG study: Keppra, Ativan Technical aspects: This EEG study was done with scalp electrodes positioned according to the 10-20 International system of electrode placement. Electrical activity was acquired at a sampling rate of 500Hz  and reviewed with a high frequency filter of 70Hz  and a low frequency filter of 1Hz . EEG data were recorded continuously and digitally  stored. Description: During awake state, no clear posterior dominant rhythm was seen.  Sleep was characterized by vertex waves, sleep spindles (12 to 14 Hz), maximal frontocentral region.  EEG also showed continuous generalized 5 to 6 Hz theta slowing as well as intermittent sharply contoured 2 to 3 Hz delta slowing in left frontotemporal region.  Hyperventilation and photic stimulation were not performed.   ABNORMALITY - Continuous slow, generalized and maximal left frontotemporal region IMPRESSION: This study is suggestive of nonspecific cortical dysfunction in the frontotemporal region.  Additionally there is moderate diffuse encephalopathy, nonspecific etiology.  No seizures or definite epileptiform discharges were seen throughout the recording. Lora Havens   CT HEAD CODE STROKE WO CONTRAST  Result Date: 12/24/2020 CLINICAL DATA:  Neuro deficit, acute, stroke suspected EXAM: CT HEAD WITHOUT CONTRAST CT ANGIOGRAPHY HEAD AND NECK CT PERFUSION BRAIN TECHNIQUE: Multi detector CT imaging of the head was performed using standard protocol without contrast. Multidetector CT imaging of the head and neck was performed using the standard protocol during bolus administration of intravenous contrast. Multiplanar CT image reconstructions and MIPs were obtained to evaluate the vascular anatomy. Carotid stenosis measurements (when applicable) are obtained utilizing NASCET criteria, using the distal internal carotid diameter as the denominator. Multiphase CT imaging of the brain was performed following IV bolus contrast injection. Subsequent parametric perfusion maps were calculated using RAPID software. CONTRAST:  100 mL Omnipaque 350 COMPARISON:  CT head May 2021, MRI brain October 2019 FINDINGS: CT HEAD FINDINGS Brain: There is no acute intracranial hemorrhage, mass effect, or edema. Gray-white differentiation is preserved. Patchy low-density in the supratentorial white matter is nonspecific but likely reflects  similar chronic microvascular ischemic changes. There are chronic small vessel infarcts of the corona radiata and basal ganglia bilaterally. Prominence of the ventricles and sulci reflects parenchymal volume loss. Partially calcified extra-axial mass in the left posterior fossa likely reflects a meningioma similar to the prior study. Vascular: No hyperdense vessel. Skull: Unremarkable. Sinuses/Orbits: No acute abnormality. Other: Mastoid air cells are clear. ASPECTS [facility] Stroke  Program Early CT Score) - Ganglionic level infarction (caudate, lentiform nuclei, internal capsule, insula, M1-M3 cortex): 7 - Supraganglionic infarction (M4-M6 cortex): 3 Total score (0-10 with 10 being normal): 10 Review of the MIP images confirms the above findings CTA NECK FINDINGS Aortic arch: Great vessel origins are patent. Right carotid system: Patent. Mixed plaque at the bifurcation and proximal internal carotid with minimal stenosis. Left carotid system: Patent. Calcified plaque at the proximal internal carotid causing less than 50% stenosis. Vertebral arteries: Patent dominant left vertebral artery. Plaque at the right vertebral origin causing marked stenosis. Diminished flow within the right vertebral artery with loss of enhancement at the C2 level to the skull base. Skeleton: Degenerative changes of the cervical spine. There is a partially calcified central disc extrusion at C4-C5 with marked canal stenosis. Other neck: Unremarkable. Upper chest: No apical lung mass. Review of the MIP images confirms the above findings CTA HEAD FINDINGS Anterior circulation: Intracranial internal carotid arteries are patent. Plaque along the proximal supraclinoid right ICA causes moderate to marked stenosis. Anterior cerebral arteries are patent. Right A1 ACA is congenitally absent. Middle cerebral arteries are patent. Mild noncalcified plaque along the right M1 MCA. Posterior circulation: Intracranial left vertebral artery is patent with  calcified plaque causing mild stenosis. The intracranial right vertebral artery is patent but irregular. Focal eccentric noncalcified plaque causes moderate stenosis. Venous sinuses: As permitted by contrast timing, patent. Review of the MIP images confirms the above findings CT Brain Perfusion Findings: CBF (<30%) Volume: 69mL Perfusion (Tmax>6.0s) volume: 31mL Mismatch Volume: 74mL Infarction Location: Area of territory at risk localizes to the brainstem and is probably artifactual. There is also motion degradation. IMPRESSION: There is no acute intracranial hemorrhage or evidence of acute infarction. ASPECT score is 10. Chronic microvascular ischemic changes and chronic small vessel infarcts. High-grade stenosis at the right vertebral artery origin. 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 This is likely chronic. Intracranially, the vessel is patent but irregular with up to moderate stenosis. Plaque without hemodynamically significant stenosis at the carotid origins. Moderate to marked stenosis of the proximal supraclinoid right ICA. Perfusion imaging demonstrates no evidence of core infarction. 4 mL of calculated territory at risk localizes to the brainstem and is probably artifactual. Initial results were communicated to Dr. Cheral Marker at 9:57 am on 12/24/2020 by text page via the [facility] messaging system. Electronically Signed   By: Macy Mis M.D.   On: 12/24/2020 10:17   CT ANGIO HEAD NECK W WO CM W PERF (CODE STROKE)  Result Date: 12/24/2020 CLINICAL DATA:  Neuro deficit, acute, stroke suspected EXAM: CT HEAD WITHOUT CONTRAST CT ANGIOGRAPHY HEAD AND NECK CT PERFUSION BRAIN TECHNIQUE: Multi detector CT imaging of the head was performed using standard protocol without contrast. Multidetector CT imaging of the head and neck was performed using the standard protocol during bolus administration of intravenous contrast. Multiplanar CT image reconstructions and MIPs were obtained to evaluate the  vascular anatomy. Carotid stenosis measurements (when applicable) are obtained utilizing NASCET criteria, using the distal internal carotid diameter as the denominator. Multiphase CT imaging of the brain was performed following IV bolus contrast injection. Subsequent parametric perfusion maps were calculated using RAPID software. CONTRAST:  100 mL Omnipaque 350 COMPARISON:  CT head May 2021, MRI brain October 2019 FINDINGS: CT HEAD FINDINGS Brain: There is no acute intracranial hemorrhage, mass effect, or edema. Gray-white differentiation is preserved. Patchy low-density in the supratentorial white matter is nonspecific but likely reflects similar chronic microvascular ischemic changes. There are  chronic small vessel infarcts of the corona radiata and basal ganglia bilaterally. Prominence of the ventricles and sulci reflects parenchymal volume loss. Partially calcified extra-axial mass in the left posterior fossa likely reflects a meningioma similar to the prior study. Vascular: No hyperdense vessel. Skull: Unremarkable. Sinuses/Orbits: No acute abnormality. Other: Mastoid air cells are clear. ASPECTS ([facility] Stroke Program Early CT Score) - Ganglionic level infarction (caudate, lentiform nuclei, internal capsule, insula, M1-M3 cortex): 7 - Supraganglionic infarction (M4-M6 cortex): 3 Total score (0-10 with 10 being normal): 10 Review of the MIP images confirms the above findings CTA NECK FINDINGS Aortic arch: Great vessel origins are patent. Right carotid system: Patent. Mixed plaque at the bifurcation and proximal internal carotid with minimal stenosis. Left carotid system: Patent. Calcified plaque at the proximal internal carotid causing less than 50% stenosis. Vertebral arteries: Patent dominant left vertebral artery. Plaque at the right vertebral origin causing marked stenosis. Diminished flow within the right vertebral artery with loss of enhancement at the C2 level to the skull base. Skeleton: Degenerative  changes of the cervical spine. There is a partially calcified central disc extrusion at C4-C5 with marked canal stenosis. Other neck: Unremarkable. Upper chest: No apical lung mass. Review of the MIP images confirms the above findings CTA HEAD FINDINGS Anterior circulation: Intracranial internal carotid arteries are patent. Plaque along the proximal supraclinoid right ICA causes moderate to marked stenosis. Anterior cerebral arteries are patent. Right A1 ACA is congenitally absent. Middle cerebral arteries are patent. Mild noncalcified plaque along the right M1 MCA. Posterior circulation: Intracranial left vertebral artery is patent with calcified plaque causing mild stenosis. The intracranial right vertebral artery is patent but irregular. Focal eccentric noncalcified plaque causes moderate stenosis. Venous sinuses: As permitted by contrast timing, patent. Review of the MIP images confirms the above findings CT Brain Perfusion Findings: CBF (<30%) Volume: 42mL Perfusion (Tmax>6.0s) volume: 20mL Mismatch Volume: 104mL Infarction Location: Area of territory at risk localizes to the brainstem and is probably artifactual. There is also motion degradation. IMPRESSION: There is no acute intracranial hemorrhage or evidence of acute infarction. ASPECT score is 10. Chronic microvascular ischemic changes and chronic small vessel infarcts. High-grade stenosis at the right vertebral artery origin. 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 This is likely chronic. Intracranially, the vessel is patent but irregular with up to moderate stenosis. Plaque without hemodynamically significant stenosis at the carotid origins. Moderate to marked stenosis of the proximal supraclinoid right ICA. Perfusion imaging demonstrates no evidence of core infarction. 4 mL of calculated territory at risk localizes to the brainstem and is probably artifactual. Initial results were communicated to Dr. Cheral Marker at 9:57 am on 12/24/2020 by text  page via the [facility] messaging system. Electronically Signed   By: Macy Mis M.D.   On: 12/24/2020 10:17    Pending Labs Unresulted Labs (From admission, onward)     Start     Ordered   12/24/20 1459  Urine Culture  Once,   R       Question:  Indication  Answer:  Altered mental status (if no other cause identified)   12/24/20 1502            Vitals/Pain Today's Vitals   12/25/20 0600 12/25/20 0755 12/25/20 0930 12/25/20 1130  BP: 109/70 (!) 119/59 115/83 106/76  Pulse: 83 89 80 80  Resp: 16 17 20 17   Temp:      TempSrc:      SpO2: 100% 100% 100% 97%  Weight:  Height:        Isolation Precautions No active isolations  Medications Medications  levETIRAcetam (KEPPRA) IVPB 500 mg/100 mL premix (0 mg Intravenous Stopped 12/25/20 0005)  acetaminophen (TYLENOL) tablet 650 mg (has no administration in time range)    Or  acetaminophen (TYLENOL) suppository 650 mg (has no administration in time range)  senna-docusate (Senokot-S) tablet 1 tablet (has no administration in time range)  ondansetron (ZOFRAN) tablet 4 mg (has no administration in time range)    Or  ondansetron (ZOFRAN) injection 4 mg (has no administration in time range)  insulin aspart (novoLOG) injection 0-15 Units (0 Units Subcutaneous Not Given 12/25/20 0759)  lactated ringers infusion ( Intravenous New Bag/Given 12/25/20 0643)  donepezil (ARICEPT) tablet 10 mg (10 mg Oral Not Given 12/25/20 0931)  nitroGLYCERIN (NITROSTAT) SL tablet 0.4 mg (has no administration in time range)  rosuvastatin (CRESTOR) tablet 5 mg (5 mg Oral Not Given 12/25/20 0931)  sertraline (ZOLOFT) tablet 25 mg (25 mg Oral Not Given 12/25/20 0932)  metoprolol tartrate (LOPRESSOR) injection 5 mg (5 mg Intravenous Given 12/25/20 0559)  aspirin suppository 150 mg (150 mg Rectal Not Given 12/25/20 0957)  sodium chloride flush (NS) 0.9 % injection 3 mL (3 mLs Intravenous Given 12/24/20 1033)  iohexol (OMNIPAQUE) 350 MG/ML injection 100 mL  (100 mLs Intravenous Contrast Given 12/24/20 0958)  metoprolol tartrate (LOPRESSOR) injection 5 mg (5 mg Intravenous Given 12/24/20 1825)  LORazepam (ATIVAN) injection 1 mg (1 mg Intravenous Given 12/24/20 1108)  levETIRAcetam (KEPPRA) 2,000 mg in sodium chloride 0.9 % 250 mL IVPB (0 mg Intravenous Stopped 12/24/20 1247)  LORazepam (ATIVAN) injection 2 mg (2 mg Intravenous Given 12/24/20 1730)    Mobility  High fall risk   Focused Assessments Neuro Assessment Handoff:  Swallow screen pass? No    NIH Stroke Scale ( + Modified Stroke Scale Criteria)  Interval: Shift assessment Level of Consciousness (1a.)   : Alert, keenly responsive LOC Questions (1b. )   +: Answers neither question correctly LOC Commands (1c. )   + : Performs both tasks correctly Best Gaze (2. )  +: Normal Visual (3. )  +: No visual loss Facial Palsy (4. )    : Minor paralysis Motor Arm, Left (5a. )   +: No drift Motor Arm, Right (5b. )   +: No drift Motor Leg, Left (6a. )   +: No drift Motor Leg, Right (6b. )   +: No drift Limb Ataxia (7. ): Absent Sensory (8. )   +: Normal, no sensory loss Best Language (9. )   +: Severe aphasia Dysarthria (10. ): Mild-to-moderate dysarthria, patient slurs at least some words and, at worst, can be understood with some difficulty Extinction/Inattention (11.)   +: No Abnormality Modified SS Total  +: 4 Complete NIHSS TOTAL: 11 Last date known well: 12/23/20 Last time known well: 2130 Neuro Assessment:   Neuro Checks:   Initial (12/24/20 0945)  Last Documented NIHSS Modified Score: 4 (12/24/20 2200) Has TPA been given? No If patient is a Neuro Trauma and patient is going to OR before floor call report to [place] nurse: [phone] or [phone]   R Recommendations: See Admitting Provider Note  Report given to:   Additional Notes:

## 2020-12-25 NOTE — Progress Notes (Signed)
Paged by RN that patient has been more agitated and HR now in the 140s. Evaluated patient at the bedside and he has been pulling at his tele monitor cords, IV, and foley catheter. He is attempting to get out of his bed, and when asked where he is going, he states he is going to drive himself home. Upon asking patient orientation questions, he is able to state his full name and birth date, but is unable to state the year or where we are currently. He believes that he is in [place] and does not know he is in a hospital. While pulling at the lines, I attempted to redirect the patient to stop pulling, however, he states that he wants to leave and doesn't need to be here. Patient does not know why he is in the hospital, nor does he think that he is sick. HR remained in the 140s and patient stated that he does not have any chest pain, palpitations, or shortness of breath- although was placed on nasal cannula by RN.   Of note, patient still has foley catheter in and urine is dark in appearance.   Physical exam: Constitutional: Awake and alert, restless in bed.  Cardio: Tachycardic rate, regular rhythm Pulm: CTAB Neuro: Alert and oriented to self only. Agitated. Able to follow commands  Plan: - Restarted LR 100 cc/hr x 12 hrs - Ativan 0.5 mg one time dose for agitation  - Could consider sitter if patient remains agitated    Buddy Duty, DO Internal Medicine Resident, PGY-1 On-call pager: [phone]

## 2020-12-25 NOTE — ED Notes (Signed)
Per SLP meds can be given with applesauce as tolerated.

## 2020-12-25 NOTE — Progress Notes (Signed)
Mr. Cwynar admitted to [phone]. Alert and oriented x2. On 2L [place]. Connected to bedside telemetry. No complaints or s/s  of pain. Skin assessment completed with Junie Panning RN. Vital signs stable.  Wife at bedside. Oriented to room, call light, and bed controls.

## 2020-12-25 NOTE — Progress Notes (Signed)
Placed nasal cannula on pt due to elevated HR. Notified MD Atway of current vital signs and increased agitation. Pt saying he wants to go home, in his truck and that he is in [place]. This nurse and MD Atway reoriented pt. Patient is pulling at telemetry lines and nasal cannula and trying to get out of bed. MD Atway ordered LR at 142ml for 12 hours IV, and ativan 0.5mg  IV one time only.   12/25/20 1838  Vitals  Temp 98.5 F (36.9 C)  Temp Source Oral  BP (!) 146/89  MAP (mmHg) 107  BP Location Left Arm  BP Method Automatic  Patient Position (if appropriate) Lying  Pulse Rate (!) 138  ECG Heart Rate (!) 138  Resp 20  MEWS COLOR  MEWS Score Color Yellow  Oxygen Therapy  SpO2 98 %  MEWS Score  MEWS Temp 0  MEWS Systolic 0  MEWS Pulse 3  MEWS RR 0  MEWS LOC 0  MEWS Score 3

## 2020-12-25 NOTE — Evaluation (Signed)
Clinical/Bedside Swallow Evaluation Patient Details  Name: Ralph West MRN: [id number] Date of Birth: Oct 17, 1932  Today's Date: 12/25/2020 Time: SLP Start Time (ACUTE ONLY): 1106 SLP Stop Time (ACUTE ONLY): 55 SLP Time Calculation (min) (ACUTE ONLY): 12 min  Past Medical History:  Past Medical History:  Diagnosis Date   Anxiety, generalized    Diabetes mellitus without complication ([place])    Hernia, umbilical    Hyperlipidemia    Hypertension    Kidney stones    Past Surgical History:  Past Surgical History:  Procedure Laterality Date   CHOLECYSTECTOMY     INGUINAL HERNIA REPAIR     KIDNEY STONE SURGERY     HPI:  Pt is an 87 year old male who presented to the ED with AMS, seizure-like activity in the ED with persistent tachycardia and worsening respiratory status. MRI brain negative. EEG 11/29: nonspecific cortical dysfunction in the frontotemporal region, moderate diffuse encephalopathy, no seizures or definite epileptiform discharges. PMH: anxiety, stable angina, type II diabetes, hyperlipidemia, hypertension, Alzheimer dementia, and myocardial infarction on ASA.    Assessment / Plan / Recommendation  Clinical Impression  Pt was seen in the ED for bedside swallow evaluation. Pt's case was discussed with RN who advised that the pt was NPO pending the swallow evaluation. Pt denied a history of dysphagia and stated that he consumes a regular texture diet when his dentures are in, but does not typically eat without them. Oral mechanism exam was [facility]. Pt was edentulous and stated that he wears dentures, but these could not located in the pt's room. Pt initially needed cues to suck from the straw, but these were quickly faded and he tolerated solids and liquids without overt s/sx of aspiration. Mastication was mildly prolonged with dysphagia 3 solids, but functional with dysphagia 2 and mild lingual residue from these consistencies was cleared with a liquid wash. A dysphagia 2 diet  with thin liquids will be initiated at this time. SLP will follow to assess diet tolerance and for advancement as clinically indicated. SLP Visit Diagnosis: Dysphagia, unspecified (R13.10)    Aspiration Risk  Mild aspiration risk    Diet Recommendation Dysphagia 2 (Fine chop);Thin liquid   Liquid Administration via: Cup;Straw Medication Administration: Whole meds with puree Supervision: Staff to assist with self feeding Compensations: Slow rate;Small sips/bites;Minimize environmental distractions Postural Changes: Seated upright at 90 degrees    Other  Recommendations Oral Care Recommendations: Oral care BID    Recommendations for follow up therapy are one component of a multi-disciplinary discharge planning process, led by the attending physician.  Recommendations may be updated based on patient status, additional functional criteria and insurance authorization.  Follow up Recommendations No SLP follow up      Assistance Recommended at Discharge Frequent or constant Supervision/Assistance  Functional Status Assessment Patient has had a recent decline in their functional status and demonstrates the ability to make significant improvements in function in a reasonable and predictable amount of time.  Frequency and Duration min 2x/week  2 weeks       Prognosis Prognosis for Safe Diet Advancement: Good Barriers to Reach Goals: Cognitive deficits      Swallow Study   General Date of Onset: 12/24/20 HPI: Pt is an 87 year old male who presented to the ED with AMS, seizure-like activity in the ED with persistent tachycardia and worsening respiratory status. MRI brain negative. EEG 11/29: nonspecific cortical dysfunction in the frontotemporal region, moderate diffuse encephalopathy, no seizures or definite epileptiform discharges. PMH: anxiety, stable angina, type II  diabetes, hyperlipidemia, hypertension, Alzheimer dementia, and myocardial infarction on ASA. Type of Study: Bedside  Swallow Evaluation Previous Swallow Assessment: none Diet Prior to this Study: NPO Temperature Spikes Noted: No Respiratory Status: Nasal cannula History of Recent Intubation: No Behavior/Cognition: Alert;Cooperative;Pleasant mood;Doesn't follow directions;Requires cueing Oral Cavity Assessment: Within Functional Limits Oral Care Completed by SLP: No Oral Cavity - Dentition: Edentulous (dentures noty located in room) Self-Feeding Abilities: Needs assist Patient Positioning: Upright in bed;Postural control adequate for testing Baseline Vocal Quality: Normal Volitional Cough: Strong Volitional Swallow: Able to elicit    Oral/Motor/Sensory Function Overall Oral Motor/Sensory Function: Within functional limits   Ice Chips Ice chips: Within functional limits Presentation: Spoon   Thin Liquid Thin Liquid: Within functional limits Presentation: Straw    Nectar Thick Nectar Thick Liquid: Not tested   Honey Thick Honey Thick Liquid: Not tested   Puree Puree: Within functional limits Presentation: Spoon   Solid     Solid: Impaired Presentation: Spoon Oral Phase Impairments: Impaired mastication     Ralph West I. Ralph West, [place], [place] Office number [phone] Pager [phone]  [name] 12/25/2020,11:24 AM

## 2020-12-25 NOTE — Progress Notes (Addendum)
   Subjective: I seen and evaluated Ralph West at bedside.  Ralph West was awake and alert.  Ralph West did not know where Ralph West was, however, Ralph West responded to questions appropriately.  Ralph West denies any complaints at this time.  Objective:  Vital signs in last 24 hours: Vitals:   12/25/20 0930 12/25/20 1130 12/25/20 1145 12/25/20 1200  BP: 115/83 106/76 114/83 117/63  Pulse: 80 80 85 71  Resp: 20 17 (!) 24 12  Temp:    98.1 F (36.7 C)  TempSrc:    Oral  SpO2: 100% 97% 96% 99%  Weight:      Height:       Physical Exam Constitutional:      General: Ralph West is awake.     Interventions: Nasal cannula in place.  HENT:     Head: Normocephalic and atraumatic.  Cardiovascular:     Heart sounds: Normal heart sounds.  Pulmonary:     Effort: Pulmonary effort is normal.     Breath sounds: Normal breath sounds and air entry.  Abdominal:     General: Bowel sounds are normal.     Palpations: Abdomen is soft.     Tenderness: There is no abdominal tenderness.  Musculoskeletal:     Right lower leg: No edema.     Left lower leg: No edema.     Comments: 5 out of 5 strength upper and lower extremities  Skin:    General: Skin is warm and dry.  Neurological:     Mental Status: Ralph West is alert.     Sensory: Sensation is intact.     Motor: Motor function is intact.  Psychiatric:        Speech: Speech normal.        Behavior: Behavior is cooperative.     Assessment/Plan:  Principal Problem:   Acute metabolic encephalopathy Active Problems:   Dementia ([place])  Acute encephalopathy 2/2 seizure disorder On exam, patient was awake, alert, and answer questions appropriately.  Patient does not recall events that led him to the hospital.  However, patient appears to be at baseline now. --Continue IV Keppra 500 mg every 12 hours per neurology --SLP recommends dysphagia 2 diet --Aspirin 81 mg daily --Avoid sedating medications --Outpatient neurology follow-up upon discharge  Alzheimer's dementia Patient appears to be  at baseline.  Stable. --Continue donepezil 10 mg daily  Hyperlipidemia --Continue rosuvastatin 5 mg daily  Hypertension Patient received IV metoprolol titrate 5 mg every 6 hours which is equivalent of oral 25 mg twice daily.  Patient can now feed orally, dysphagia 2 diet per SLP results. --Resume metoprolol tartrate 25 mg twice daily  Stable angina --Nitroglycerin 0.4 mg as needed  Anxiety --Continue home medication sertraline 25 mg daily  Prior to Admission Living Arrangement: Anticipated Discharge Location: Barriers to Discharge: Dispo: Anticipated discharge in approximately 1-2 day(s).   Timothy Lasso, MD 12/25/2020, 2:30 PM Pager: [phone] After 5pm on weekdays and 1pm on weekends: On Call pager [phone]

## 2020-12-26 ENCOUNTER — Inpatient Hospital Stay (HOSPITAL_COMMUNITY): Payer: Medicare Other

## 2020-12-26 DIAGNOSIS — I5030 Unspecified diastolic (congestive) heart failure: Secondary | ICD-10-CM | POA: Diagnosis not present

## 2020-12-26 DIAGNOSIS — F03911 Unspecified dementia, unspecified severity, with agitation: Secondary | ICD-10-CM

## 2020-12-26 LAB — ECHOCARDIOGRAM COMPLETE
AR max vel: 1.62 cm2
AV Area VTI: 1.7 cm2
AV Area mean vel: 1.66 cm2
AV Mean grad: 2 mmHg
AV Peak grad: 2.8 mmHg
Ao pk vel: 0.83 m/s
Area-P 1/2: 5.5 cm2
Height: 65 in
S' Lateral: 3.5 cm
Weight: 2645.52 oz

## 2020-12-26 LAB — CBC WITH DIFFERENTIAL/PLATELET
Abs Immature Granulocytes: 0.03 10*3/uL (ref 0.00–0.07)
Basophils Absolute: 0 10*3/uL (ref 0.0–0.1)
Basophils Relative: 0 %
Eosinophils Absolute: 0.1 10*3/uL (ref 0.0–0.5)
Eosinophils Relative: 1 %
HCT: 41.7 % (ref 39.0–52.0)
Hemoglobin: 13.9 g/dL (ref 13.0–17.0)
Immature Granulocytes: 0 %
Lymphocytes Relative: 18 %
Lymphs Abs: 1.8 10*3/uL (ref 0.7–4.0)
MCH: 32.2 pg (ref 26.0–34.0)
MCHC: 33.3 g/dL (ref 30.0–36.0)
MCV: 96.5 fL (ref 80.0–100.0)
Monocytes Absolute: 0.9 10*3/uL (ref 0.1–1.0)
Monocytes Relative: 9 %
Neutro Abs: 7.2 10*3/uL (ref 1.7–7.7)
Neutrophils Relative %: 72 %
Platelets: 167 10*3/uL (ref 150–400)
RBC: 4.32 MIL/uL (ref 4.22–5.81)
RDW: 12.5 % (ref 11.5–15.5)
WBC: 10.1 10*3/uL (ref 4.0–10.5)
nRBC: 0 % (ref 0.0–0.2)

## 2020-12-26 LAB — COMPREHENSIVE METABOLIC PANEL
ALT: 46 U/L — ABNORMAL HIGH (ref 0–44)
AST: 112 U/L — ABNORMAL HIGH (ref 15–41)
Albumin: 3.1 g/dL — ABNORMAL LOW (ref 3.5–5.0)
Alkaline Phosphatase: 72 U/L (ref 38–126)
Anion gap: 8 (ref 5–15)
BUN: 13 mg/dL (ref 8–23)
CO2: 26 mmol/L (ref 22–32)
Calcium: 8.8 mg/dL — ABNORMAL LOW (ref 8.9–10.3)
Chloride: 107 mmol/L (ref 98–111)
Creatinine, Ser: 1 mg/dL (ref 0.61–1.24)
GFR, Estimated: >60 mL/min (ref >60)
Glucose, Bld: 139 mg/dL — ABNORMAL HIGH (ref 70–99)
Potassium: 3.8 mmol/L (ref 3.5–5.1)
Sodium: 141 mmol/L (ref 135–145)
Total Bilirubin: 2.3 mg/dL — ABNORMAL HIGH (ref 0.3–1.2)
Total Protein: 5.8 g/dL — ABNORMAL LOW (ref 6.5–8.1)

## 2020-12-26 LAB — URINE CULTURE

## 2020-12-26 LAB — GLUCOSE, CAPILLARY
Glucose-Capillary: 103 mg/dL — ABNORMAL HIGH (ref 70–99)
Glucose-Capillary: 107 mg/dL — ABNORMAL HIGH (ref 70–99)
Glucose-Capillary: 108 mg/dL — ABNORMAL HIGH (ref 70–99)
Glucose-Capillary: 113 mg/dL — ABNORMAL HIGH (ref 70–99)
Glucose-Capillary: 128 mg/dL — ABNORMAL HIGH (ref 70–99)
Glucose-Capillary: 181 mg/dL — ABNORMAL HIGH (ref 70–99)

## 2020-12-26 LAB — D-DIMER, QUANTITATIVE: D-Dimer, Quant: 1.39 ug/mL-FEU — ABNORMAL HIGH (ref 0.00–0.50)

## 2020-12-26 MED ORDER — AMIODARONE HCL IN DEXTROSE 360-4.14 MG/200ML-% IV SOLN: 30.0000 mg/h | INTRAVENOUS | Status: DC

## 2020-12-26 MED ORDER — METOPROLOL TARTRATE 5 MG/5ML IV SOLN: 10.0000 mg | Freq: Once | INTRAVENOUS | Status: DC

## 2020-12-26 MED ORDER — METOPROLOL TARTRATE 5 MG/5ML IV SOLN
5.0000 mg | Freq: Once | INTRAVENOUS | Status: AC
Start: 2020-12-26 — End: 2020-12-26
  Administered 2020-12-26: 5 mg via INTRAVENOUS
  Filled 2020-12-26: qty 5

## 2020-12-26 MED ORDER — SODIUM CHLORIDE 0.9 % IV SOLN
100.0000 mg | Freq: Two times a day (BID) | INTRAVENOUS | Status: DC
  Administered 2020-12-26 – 2021-01-06 (×22): 100 mg via INTRAVENOUS
  Filled 2020-12-26 (×29): qty 10

## 2020-12-26 MED ORDER — PERFLUTREN LIPID MICROSPHERE
1.0000 mL | INTRAVENOUS | Status: AC | PRN
Start: 2020-12-26 — End: 2020-12-26
  Administered 2020-12-26: 2 mL via INTRAVENOUS
  Filled 2020-12-26: qty 10

## 2020-12-26 MED ORDER — AMIODARONE HCL IN DEXTROSE 360-4.14 MG/200ML-% IV SOLN: 60.0000 mg/h | INTRAVENOUS | Status: DC

## 2020-12-26 MED ORDER — METOPROLOL TARTRATE 25 MG PO TABS
25.0000 mg | ORAL_TABLET | Freq: Four times a day (QID) | ORAL | Status: DC
  Administered 2020-12-26 – 2021-01-06 (×43): 25 mg via ORAL
  Filled 2020-12-26 (×47): qty 1

## 2020-12-26 MED ORDER — LACTATED RINGERS IV SOLN: INTRAVENOUS | Status: DC

## 2020-12-26 MED ORDER — LACTATED RINGERS IV BOLUS
500.0000 mL | Freq: Once | INTRAVENOUS | Status: AC
  Administered 2020-12-26: 500 mL via INTRAVENOUS

## 2020-12-26 MED ORDER — SODIUM CHLORIDE 0.9 % IV SOLN
200.0000 mg | Freq: Once | INTRAVENOUS | Status: AC
  Administered 2020-12-26: 200 mg via INTRAVENOUS
  Filled 2020-12-26: qty 20

## 2020-12-26 MED ORDER — ENOXAPARIN SODIUM 40 MG/0.4ML IJ SOSY
40.0000 mg | PREFILLED_SYRINGE | INTRAMUSCULAR | Status: DC
  Administered 2020-12-26 – 2021-01-03 (×9): 40 mg via SUBCUTANEOUS
  Filled 2020-12-26 (×12): qty 0.4

## 2020-12-26 MED ORDER — DILTIAZEM HCL-DEXTROSE 125-5 MG/125ML-% IV SOLN (PREMIX)
5.0000 mg/h | INTRAVENOUS | Status: DC
Start: 2020-12-26 — End: 2020-12-26
  Filled 2020-12-26: qty 125

## 2020-12-26 NOTE — Progress Notes (Signed)
PIV consult: RN reports pt's medications are compatible. Second site not needed at this time, per Hollice Espy, Therapist, sports. L wrist site assessed, hub tightened/ dressing changed. RN will re-enter IV Team consult if incompatible meds are needed.

## 2020-12-26 NOTE — Progress Notes (Signed)
2D echocardiogram completed.  12/26/2020 4:27 PM Kelby Aline., MHA, RVT, RDCS, RDMS

## 2020-12-26 NOTE — Plan of Care (Signed)
  Problem: Safety: Goal: Non-violent Restraint(s) Outcome: Progressing   

## 2020-12-26 NOTE — Progress Notes (Signed)
Messaged at 2200 regarding patient's agitation. Patient received IM haldol x1 and PO zyprexa 5mg  x1. He eventually calmed down. At 2300 was messaged that patient with heart rate persistently in 150s-170s. Initially concerned for atrial flutter, 12 lead was obtained. Reviewed with oncall cardiology fellow and patient was noted to be in focal atrial tachycardia. Patient's metop tartrated was changed to 25mg  q6h and patient was given IV metoprolol 5mg  x1. His heart rate was noted to decrease after this.

## 2020-12-26 NOTE — Progress Notes (Addendum)
HD#1 SUBJECTIVE:  Patient Summary: Ralph West is a 87 y.o. with a pertinent PMH of diabetes hyperlipidemia hypertension Alzheimer's MI, who presented with seizure-like activity and admitted for acute metabolic encephalopathy.   Overnight Events: Agitation received Haldol and Zyprexa.  Eventually calm down.  Also noted to have heart rate 150s was given metoprolol.    Interm History: None  OBJECTIVE:  Vital Signs: Vitals:   12/26/20 0200 12/26/20 0212 12/26/20 0300 12/26/20 0400  BP: (!) 127/97 123/86 126/77 (!) 110/91  Pulse: (!) 173 (!) 139 (!) 132 100  Resp: 20 20 (!) 30 (!) 24  Temp: 98.3 F (36.8 C) 98.3 F (36.8 C)  98.3 F (36.8 C)  TempSrc: Oral Oral    SpO2: 93% 97% 97% 95%  Weight:      Height:       Supplemental O2: Room Air SpO2: 95 % O2 Flow Rate (L/min): 2 L/min  Filed Weights   12/24/20 0900  Weight: 75 kg     Intake/Output Summary (Last 24 hours) at 12/26/2020 0732 Last data filed at 12/26/2020 0700 Gross per 24 hour  Intake 2902.03 ml  Output 2100 ml  Net 802.03 ml   Net IO Since Admission: 1,172.54 mL [12/26/20 0732]  Physical Exam: Physical Exam Constitutional:      Appearance: Normal appearance. He is normal weight.  HENT:     Head: Atraumatic.     Mouth/Throat:     Mouth: Mucous membranes are dry.  Eyes:     Pupils: Pupils are equal, round, and reactive to light.  Cardiovascular:     Rate and Rhythm: Tachycardia present.     Heart sounds: Normal heart sounds.  Pulmonary:     Effort: Pulmonary effort is normal.     Breath sounds: Normal breath sounds.  Abdominal:     General: Abdomen is flat. Bowel sounds are normal.     Palpations: Abdomen is soft.  Musculoskeletal:        General: Normal range of motion.     Cervical back: Normal range of motion and neck supple.  Skin:    General: Skin is warm and dry.  Neurological:     General: No focal deficit present.     Mental Status: He is alert. Mental status is at baseline.   Psychiatric:        Mood and Affect: Mood normal.        Behavior: Behavior normal.    Patient Lines/Drains/Airways Status     Active Line/Drains/Airways     Name Placement date Placement time Site Days   Peripheral IV 12/24/20 18 G Anterior;Distal;Left Forearm 12/24/20  1033  Forearm  2   External Urinary Catheter 12/25/20  1200  --  1            Pertinent Labs: CBC Latest Ref Rng & Units 12/25/2020 12/24/2020 12/24/2020  WBC 4.0 - 10.5 K/uL 11.6(H) - 12.5(H)  Hemoglobin 13.0 - 17.0 g/dL 13.4 15.0 14.9  Hematocrit 39.0 - 52.0 % 40.2 44.0 44.4  Platelets 150 - 400 K/uL 154 - 184    CMP Latest Ref Rng & Units 12/25/2020 12/24/2020 12/24/2020  Glucose 70 - 99 mg/dL 117(H) 200(H) 204(H)  BUN 8 - 23 mg/dL 13 22 17   Creatinine 0.61 - 1.24 mg/dL 0.85 0.90 1.15  Sodium 135 - 145 mmol/L 138 139 138  Potassium 3.5 - 5.1 mmol/L 3.5 3.8 3.6  Chloride 98 - 111 mmol/L 106 106 105  CO2 22 - 32 mmol/L  25 - 23  Calcium 8.9 - 10.3 mg/dL 8.5(L) - 8.8(L)  Total Protein 6.5 - 8.1 g/dL - - 6.1(L)  Total Bilirubin 0.3 - 1.2 mg/dL - - 1.3(H)  Alkaline Phos 38 - 126 U/L - - 75  AST 15 - 41 U/L - - 44(H)  ALT 0 - 44 U/L - - 51(H)    Recent Labs    12/25/20 2000 12/26/20 0012 12/26/20 0347  GLUCAP 113* 103* 113*     Pertinent Imaging: No results found.  ASSESSMENT/PLAN:  Assessment: Principal Problem:   Acute metabolic encephalopathy Active Problems:   Dementia (HCC)   Ralph West is a 87 y.o. with pertinent PMH of Alzheimer's, hypertension, hyperlipidemia who presented with jerking/seizure-like activity and admit for acute encephalopathy on hospital day 1  Plan: #Acute encephalopathy secondary to seizure disorder. Alert and oriented answering questions appropriately. Discontinued Keppra due to agitation.  Vimpat initiated.  Continue seizure precautions.  Ativan as needed for any seizure activity amphetamines. Continue Aricept  #Sinus tachycardia.  Patient developed  tachycardia 150s to 170s overnight. Appears hypovolemic on exam, no other focal complaints to suggest driving source of tachycardia. No signs of infection.  - IV fluids today with LR - Echo today to rule out structural heart disease - D Dimer elevated, at risk for PE, will get CTA chest - Continue metoprolol 25mg  q6 to avoid beta blocker withdrawal  #Alzheimer's dementia Continue donepezil.  Hyperlipidemia continue statin  Hypertension Continue metoprolol.  Best Practice: IVF: Fluids: LR,  VTE:  Code: Full Family Contact: wife, at bedside. Dissipate discharge approximately 1 to 2 days  Signature: Delene Ruffini, MD  Internal Medicine Resident, PGY-1 [facility]  Pager: [phone] 7:32 AM, 12/26/2020   Please contact the on call pager after 5 pm and on weekends at [phone].

## 2020-12-26 NOTE — Progress Notes (Signed)
Neurology Progress Note  Brief HPI: 87 y.o. male with PMHx of anxiety, type 2 DM, HLD, HTN, nephrolithiasis, NSTEMI, and dementia who presented from home 11/29 via EMS with acute onset of agitation, confusion, and dysphasia after being found by his wife in bed grunting, moaning, and flailing around. Patient was found to be tachycardic with a rate sustained in the 150's, agitated, and with garbled speech. He was unable to walk or follow commands but was activated as a Code Stroke for acute onset of aphasia. During initial evaluation, patient had a witnessed seizure by staff while in the ED lasting approximately 20 minutes described as rightward head turn, right gaze deviation, and jerking of the RUE.   Subjective: Patient had atrial tachycardia overnight with rates in the 150s-170s with agitation requiring Haldol and Zyprexa with improvement in agitation.   Exam: Vitals:   12/26/20 0900 12/26/20 1000  BP: (!) 142/101 (!) 141/87  Pulse: (!) 136 99  Resp: (!) 32 14  Temp:  99.3 F (37.4 C)  SpO2: 93% 97%   Gen: Laying comfortably in hospital bed, in no acute distress. Bilateral soft wrist restraints in place.  Resp: non-labored breathing, no respiratory distress Abd: soft, non-distended Cardiac: Tachycardic on telemetry with a sustained rate in the 140s during examination this morning   Neuro: Mental Status: Asleep initially, wakes to voice. He is oriented to self but incorrectly states that he is 87 years old, the year is 44, and when asked the month he states "1982". He states that he is in the hospital but does not know why.  Speech is non-fluent. He is able to name examiner's thumb but calls a watch "an alarm" and examiner's pen a pencil.  Speech is somewhat garbled and difficult to understand with intermittent unintelligible answers. Patient answers in 1-2 word brief answers.  Patient is able to follow simple commands without difficulty.  There is no evidence of neglect noted.   Cranial Nerves: PERRL, EOMI without nystagmus or gaze preference, face is symmetric resting and smiling, hearing is intact to voice, tongue protrudes midline.  Motor: Patient spontaneously moves each extremity with antigravity movement (BUE within limitation due to bilateral soft wrist restraints) without asymmetry. He is able to elevate bilateral lower extremities antigravity without vertical drift. Tone and bulk are normal.  Sensory: Intact and symmetric to light touch throughout upper and lower extremities  DTR: 2+ and symmetric throughout Plantars: Toes downgoing bilaterally Gait: Deferred  Pertinent Labs: CBC    Component Value Date/Time   WBC 10.1 12/26/2020 0811   RBC 4.32 12/26/2020 0811   HGB 13.9 12/26/2020 0811   HCT 41.7 12/26/2020 0811   PLT 167 12/26/2020 0811   MCV 96.5 12/26/2020 0811   MCH 32.2 12/26/2020 0811   MCHC 33.3 12/26/2020 0811   RDW 12.5 12/26/2020 0811   LYMPHSABS 1.8 12/26/2020 0811   MONOABS 0.9 12/26/2020 0811   EOSABS 0.1 12/26/2020 0811   BASOSABS 0.0 12/26/2020 0811   CMP     Component Value Date/Time   NA 141 12/26/2020 0811   NA 144 08/24/2019 1151   K 3.8 12/26/2020 0811   CL 107 12/26/2020 0811   CO2 26 12/26/2020 0811   GLUCOSE 139 (H) 12/26/2020 0811   BUN 13 12/26/2020 0811   BUN 16 08/24/2019 1151   CREATININE 1.00 12/26/2020 0811   CALCIUM 8.8 (L) 12/26/2020 0811   PROT 5.8 (L) 12/26/2020 0811   PROT 6.2 08/24/2019 1151   ALBUMIN 3.1 (L) 12/26/2020 0811   ALBUMIN  4.3 08/24/2019 1151   AST 112 (H) 12/26/2020 0811   ALT 46 (H) 12/26/2020 0811   ALKPHOS 72 12/26/2020 0811   BILITOT 2.3 (H) 12/26/2020 0811   BILITOT 1.0 08/24/2019 1151   GFRNONAA >60 12/26/2020 0811   GFRAA 73 08/24/2019 1151  Urinalysis    Component Value Date/Time   COLORURINE YELLOW 12/24/2020 1502   APPEARANCEUR CLEAR 12/24/2020 1502   LABSPEC 1.015 12/24/2020 1502   PHURINE 6.5 12/24/2020 1502   GLUCOSEU 100 (A) 12/24/2020 1502   HGBUR TRACE (A)  12/24/2020 1502   BILIRUBINUR NEGATIVE 12/24/2020 1502   KETONESUR NEGATIVE 12/24/2020 1502   PROTEINUR NEGATIVE 12/24/2020 1502   NITRITE POSITIVE (A) 12/24/2020 1502   LEUKOCYTESUR NEGATIVE 12/24/2020 1502   Drugs of Abuse     Component Value Date/Time   LABOPIA NONE DETECTED 12/24/2020 1502   COCAINSCRNUR NONE DETECTED 12/24/2020 1502   LABBENZ NONE DETECTED 12/24/2020 1502   AMPHETMU NONE DETECTED 12/24/2020 1502   THCU NONE DETECTED 12/24/2020 1502   LABBARB NONE DETECTED 12/24/2020 1502     Latest Reference Range & Units 12/24/20 17:24  CK Total 49 - 397 U/L 532 (H)   Lab Results  Component Value Date   TSH 0.670 12/24/2020   Imaging Reviewed:  MRI brain 11/30: 1. No acute intracranial abnormality. 2. Generalized volume loss and findings of chronic microvascular ischemia.  EEG 11/29: "This study is suggestive of nonspecific cortical dysfunction in the frontotemporal region.  Additionally there is moderate diffuse encephalopathy, nonspecific etiology.  No seizures or definite epileptiform discharges were seen throughout the recording."  Assessment: 87 y.o. male with a PMHx of anxiety, DM, HLD, HTN, nephrolithiasis, NSTEMI and dementia, presenting from home via EMS with acute onset of agitation, confusion and dysphasia. Initially found by wife at approximately 8:50 AM in bed grunting, moaning and flailing around, not following instructions, no coordinated movement. LKN 9:30 last night when they went to sleep. EMS was called and on arrival he was in sinus tachycardia at a rate of 150, no diaphoresis, pallor or cyanosis. EMS also noted awake agitated state with garbled speech and inability to follow commands. Unable to walk - had to be carried out by EMS. No jerking or twitching seen. Patient fell asleep on ride in ambulance, with EMS needing to wake him up frequently. No definite weakness or facial droop noted by EMS; not leaning to one side. Uses incontinence pads at baseline. He  normally walks on his own, is interactive and speaks fluently with wife.  1. Exam reveals expressive > receptive aphasia without focal motor weakness.  2. CT head: There is no acute intracranial hemorrhage or evidence of acute infarction. ASPECT score is 10. Chronic microvascular ischemic changes and chronic small vessel infarcts are noted. 3. CTA head and neck: High-grade stenosis at the right vertebral artery origin. 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 This is likely chronic. Intracranially, the vessel is patent but irregular with up to moderate stenosis.Plaque without hemodynamically significant stenosis at the carotid origins. Moderate to marked stenosis of the proximal supraclinoid right ICA. 4. Perfusion imaging demonstrates no evidence of core infarction. 4 mL of calculated territory at risk localizes to the brainstem and is probably artifactual. 5. EEG 11/29: Continuous slow, generalized and maximal left frontotemporal region. This study is suggestive of nonspecific cortical dysfunction in the frontotemporal region.  Additionally there is moderate diffuse encephalopathy, nonspecific etiology. No seizures or definite epileptiform discharges were seen throughout the recording.  6. Patient did have a  witnessed seizure by staff on 11/29 s/p 1 mg of Ativan. Seizure lasted approximately 20 minutes.  7 MRI brain is without acute intracranial abnormality 8. DDx: Most likely component of the DDx is acute encephalopathy, most likely toxic/metabolic or due to intercurrent infection. No neck stiffness or fever to suggest a meningitis. First time seizure is also possible with witnessed seizure activity by staff in the ED. Focal stroke in Wernicke's or Broca's area also on the DDx, but unlikely given negative CTP with artifact and negative MRI brain imaging for acute intracranial process.   Recommendations: - Continue Aricept - Avoid sedating medications as much as possible - Vimpat 200 mg  load followed by 100 mg BID (Less desirable to initiate Depakote or Dilantin therapy with AST elevation) - Will discontinue Keppra due to agitation after first maintenance dose of Vimpat initiated  - Continue seizure precautions  - 2 mg IV Ativan PRN any seizure activity lasting > 5 minutes and notify neurology    Anibal Henderson, AGACNP-BC [facility] [phone]   Electronically signed: Dr. Kerney Elbe

## 2020-12-26 NOTE — Evaluation (Signed)
Physical Therapy Evaluation Patient Details Name: Elius Etheredge MRN: [id number] DOB: 05/16/1932 Today's Date: 12/26/2020  History of Present Illness  87 year old male  presented to the ED with cc of aphasia 12/24/20. MRI negative; acute encephalopathy, ?seizure   PMH of anxiety, stable angina, type II diabetes, hyperlipidemia, hypertension, Alzheimer dementia, and myocardial infarction on ASA  Clinical Impression   Pt admitted secondary to problem above with deficits below. PTA patient was walking independently without a device and required full supervision due to decr cognition. He required assist up/down step to enter/exit home.  Pt currently requires +2 mod-max assist for basic mobility and was unable to progress to ambulation. Anticipate patient will benefit from PT to address problems listed below. Will continue to follow acutely to maximize functional mobility independence and safety. Currently recommending SNF, however will see as frequently as able as discharge home would be best for his cognition (if he can progress to a point the wife can manage).         Recommendations for follow up therapy are one component of a multi-disciplinary discharge planning process, led by the attending physician.  Recommendations may be updated based on patient status, additional functional criteria and insurance authorization.  Follow Up Recommendations Skilled nursing-short term rehab (<3 hours/day)    Assistance Recommended at Discharge Frequent or constant Supervision/Assistance  Functional Status Assessment Patient has had a recent decline in their functional status and demonstrates the ability to make significant improvements in function in a reasonable and predictable amount of time.  Equipment Recommendations  None recommended by PT    Recommendations for Other Services       Precautions / Restrictions Precautions Precautions: Fall;Other (comment) Precaution Comments: bil wrist  restraints Restrictions Weight Bearing Restrictions: No      Mobility  Bed Mobility Overal bed mobility: Needs Assistance Bed Mobility: Rolling;Sidelying to Sit;Sit to Supine Rolling: Max assist Sidelying to sit: Mod assist;+2 for physical assistance   Sit to supine: Min assist;+2 for physical assistance   General bed mobility comments: pt  able to state he wanted to return to bed and initiated movement (assist to guide upper body and to lift legs)    Transfers Overall transfer level: Needs assistance Equipment used: 2 person hand held assist Transfers: Sit to/from Stand Sit to Stand: Mod assist;+2 physical assistance           General transfer comment: pt with posterior bias, flexed at hips and knees    Ambulation/Gait             Pre-gait activities: attempted side step to [facility] with poor coordination or ability to advance lead leg (LLE)    Stairs            Wheelchair Mobility    Modified Rankin (Stroke Patients Only)       Balance Overall balance assessment: Needs assistance   Sitting balance-Leahy Scale: Fair       Standing balance-Leahy Scale: Poor                               Pertinent Vitals/Pain Pain Assessment: No/denies pain    Home Living Family/patient expects to be discharged to:: Private residence Living Arrangements: Spouse/significant other Available Help at Discharge: Family;Available 24 hours/day Type of Home: House Home Access: Stairs to enter Entrance Stairs-Rails: None Entrance Stairs-Number of Steps: 1   Home Layout: Laundry or work area in basement;Two level;Able to live on main level with bedroom/bathroom  Home Equipment: Rollator (4 wheels);Cane - single point      Prior Function Prior Level of Function : Needs assist       Physical Assist : Mobility (physical);ADLs (physical) Mobility (physical): Stairs ADLs (physical): Bathing;Dressing Mobility Comments: assists on single step in/out of  house; pt walking with no device ADLs Comments: wife gives him a birdbath; assists with dressing     Hand Dominance        Extremity/Trunk Assessment   Upper Extremity Assessment Upper Extremity Assessment: Defer to OT evaluation    Lower Extremity Assessment Lower Extremity Assessment: Generalized weakness       Communication   Communication: Other (comment) (lethargic and mumbling)  Cognition Arousal/Alertness: Lethargic;Suspect due to medications Behavior During Therapy: Flat affect Overall Cognitive Status: Impaired/Different from baseline Area of Impairment: Orientation;Attention;Memory;Following commands;Safety/judgement;Problem solving                 Orientation Level: Place;Time;Situation Current Attention Level: Focused;Sustained Memory: Decreased recall of precautions;Decreased short-term memory Following Commands: Follows one step commands inconsistently;Follows one step commands with increased time Safety/Judgement: Decreased awareness of safety;Decreased awareness of deficits   Problem Solving: Slow processing;Decreased initiation;Difficulty sequencing;Requires verbal cues;Requires tactile cues General Comments: able to answer to his name and identify his wife in the room; required step by step intructions for all bed mobility (plus hand over hand guidance)        General Comments General comments (skin integrity, edema, etc.): Wife present and discussed discharge plan    Exercises     Assessment/Plan    PT Assessment Patient needs continued PT services  PT Problem List Decreased strength;Decreased activity tolerance;Decreased balance;Decreased mobility;Decreased cognition;Decreased knowledge of use of DME;Decreased safety awareness;Cardiopulmonary status limiting activity       PT Treatment Interventions DME instruction;Gait training;Stair training;Functional mobility training;Therapeutic activities;Therapeutic exercise;Cognitive  remediation;Patient/family education    PT Goals (Current goals can be found in the Care Plan section)  Acute Rehab PT Goals Patient Stated Goal: unable to state; wife wants him to be able to come home but needs to be more independent PT Goal Formulation: With family Time For Goal Achievement: 01/09/21 Potential to Achieve Goals: Good    Frequency Min 3X/week   Barriers to discharge Decreased caregiver support wife reports she cannot manage him at home as he is    Co-evaluation PT/OT/SLP Co-Evaluation/Treatment: Yes Reason for Co-Treatment: Necessary to address cognition/behavior during functional activity;For patient/therapist safety PT goals addressed during session: Mobility/safety with mobility;Balance         AM-PAC PT "6 Clicks" Mobility  Outcome Measure Help needed turning from your back to your side while in a flat bed without using bedrails?: A Lot Help needed moving from lying on your back to sitting on the side of a flat bed without using bedrails?: Total Help needed moving to and from a bed to a chair (including a wheelchair)?: Total Help needed standing up from a chair using your arms (e.g., wheelchair or bedside chair)?: Total Help needed to walk in hospital room?: Total Help needed climbing 3-5 steps with a railing? : Total 6 Click Score: 7    End of Session Equipment Utilized During Treatment: Oxygen Activity Tolerance: Patient limited by fatigue;Patient limited by lethargy;Treatment limited secondary to medical complications (Comment) (HR up to 142 bpm) Patient left: in bed;with call bell/phone within reach;with bed alarm set;with family/visitor present;with restraints reapplied (bil wrist)   PT Visit Diagnosis: Unsteadiness on feet (R26.81);Muscle weakness (generalized) (M62.81);Difficulty in walking, not elsewhere classified (R26.2)  Time: 3167-4255 PT Time Calculation (min) (ACUTE ONLY): 23 min   Charges:   PT Evaluation $PT Eval Moderate  Complexity: [place], PT Acute Rehabilitation Services  Pager [phone] Office [phone]   Rexanne Mano 12/26/2020, 12:00 PM

## 2020-12-26 NOTE — Evaluation (Signed)
Occupational Therapy Evaluation Patient Details Name: Ralph West MRN: [id number] DOB: [birth year] Today's Date: 12/26/2020   History of Present Illness 87 year old male  presented to the ED with cc of aphasia 12/24/20. MRI negative; acute encephalopathy, ?seizure   PMH of anxiety, stable angina, type II diabetes, hyperlipidemia, hypertension, Alzheimer dementia, and myocardial infarction on ASA   Clinical Impression   Pt admitted for concerns listed above. PTA pt's wife reported that he was ambulating at home with no difficulty and she was assisting him in completing ADL's, typically at a seated level. At this time, pt is presenting with increased confusion, difficulty sequencing and attending to tasks, and weakness. Pt is requiring mod A +2 for bed mobility and sit<>stands, and all other transfers pt requires max A +2. Additionally, pt is also requiring max A +1-2 for all ADL's. Recommending SNF level therapies at this time, to maximize pt's potential to return to prior level of function and decrease caregiver burden. OT will follow acutely to address concerns listed below.      Recommendations for follow up therapy are one component of a multi-disciplinary discharge planning process, led by the attending physician.  Recommendations may be updated based on patient status, additional functional criteria and insurance authorization.   Follow Up Recommendations  Skilled nursing-short term rehab (<3 hours/day)    Assistance Recommended at Discharge Frequent or constant Supervision/Assistance  Functional Status Assessment  Patient has had a recent decline in their functional status and/or demonstrates limited ability to make significant improvements in function in a reasonable and predictable amount of time  Equipment Recommendations  Other (comment) (TBD)    Recommendations for Other Services       Precautions / Restrictions Precautions Precautions: Fall;Other (comment) Precaution  Comments: bil wrist restraints Restrictions Weight Bearing Restrictions: No      Mobility Bed Mobility Overal bed mobility: Needs Assistance Bed Mobility: Rolling;Sidelying to Sit;Sit to Supine Rolling: Max assist Sidelying to sit: Mod assist;+2 for physical assistance   Sit to supine: Min assist;+2 for physical assistance   General bed mobility comments: pt  able to state he wanted to return to bed and initiated movement (assist to guide upper body and to lift legs)    Transfers Overall transfer level: Needs assistance Equipment used: 2 person hand held assist Transfers: Sit to/from Stand Sit to Stand: Mod assist;+2 physical assistance           General transfer comment: pt with posterior bias, flexed at hips and knees      Balance Overall balance assessment: Needs assistance   Sitting balance-Leahy Scale: Fair       Standing balance-Leahy Scale: Poor                             ADL either performed or assessed with clinical judgement   ADL Overall ADL's : Needs assistance/impaired Eating/Feeding: Maximal assistance;Sitting   Grooming: Maximal assistance;Sitting   Upper Body Bathing: Maximal assistance;Sitting   Lower Body Bathing: Maximal assistance;+2 for physical assistance;+2 for safety/equipment;Sitting/lateral leans;Sit to/from stand   Upper Body Dressing : Maximal assistance;Sitting   Lower Body Dressing: Maximal assistance;+2 for safety/equipment;+2 for physical assistance;Sitting/lateral leans;Sit to/from stand   Toilet Transfer: Maximal assistance;+2 for physical assistance;+2 for safety/equipment;Stand-pivot   Toileting- Clothing Manipulation and Hygiene: Maximal assistance;+2 for physical assistance;+2 for safety/equipment;Sitting/lateral lean;Sit to/from stand       Functional mobility during ADLs: Moderate assistance;Maximal assistance;+2 for physical assistance;+2 for safety/equipment General ADL Comments: Pt  with poor  cognition, having difficulty following commands and completing functional tasks without max assist.     Vision Baseline Vision/History: 1 Wears glasses Ability to See in Adequate Light: 0 Adequate Patient Visual Report: No change from baseline Additional Comments: Need further assessment as pt cognition and arousal improves, as pt was falling asleep throughout session.     Perception     Praxis Praxis Praxis-Other Comments: Needs further assessment - pt pulling at gown thinking that it was his blanket throughout session, requiring restraints to keep pt from pulling off catheter and heart monitor.    Pertinent Vitals/Pain Pain Assessment: No/denies pain     Hand Dominance Right   Extremity/Trunk Assessment Upper Extremity Assessment Upper Extremity Assessment: Generalized weakness   Lower Extremity Assessment Lower Extremity Assessment: Defer to PT evaluation   Cervical / Trunk Assessment Cervical / Trunk Assessment: Kyphotic   Communication Communication Communication: Other (comment) (lethargic and mumbling)   Cognition Arousal/Alertness: Lethargic;Suspect due to medications Behavior During Therapy: Flat affect Overall Cognitive Status: Impaired/Different from baseline Area of Impairment: Orientation;Attention;Memory;Following commands;Safety/judgement;Problem solving                 Orientation Level: Place;Time;Situation Current Attention Level: Focused;Sustained Memory: Decreased recall of precautions;Decreased short-term memory Following Commands: Follows one step commands inconsistently;Follows one step commands with increased time Safety/Judgement: Decreased awareness of safety;Decreased awareness of deficits   Problem Solving: Slow processing;Decreased initiation;Difficulty sequencing;Requires verbal cues;Requires tactile cues General Comments: able to answer to his name and identify his wife in the room; required step by step intructions for all bed  mobility (plus hand over hand guidance)     General Comments  Wife present and discussed discharge plan    Exercises     Shoulder Instructions      Home Living Family/patient expects to be discharged to:: Private residence Living Arrangements: Spouse/significant other Available Help at Discharge: Family;Available 24 hours/day Type of Home: House Home Access: Stairs to enter [employer] of Steps: 1 Entrance Stairs-Rails: None Home Layout: Laundry or work area in basement;Two level;Able to live on main level with bedroom/bathroom         Bathroom Toilet: Handicapped height (+standard--might use either) Bathroom Accessibility: No   Home Equipment: Rollator (4 wheels);Cane - single point          Prior Functioning/Environment Prior Level of Function : Needs assist       Physical Assist : Mobility (physical);ADLs (physical) Mobility (physical): Stairs ADLs (physical): Bathing;Dressing Mobility Comments: assists on single step in/out of house; pt walking with no device ADLs Comments: wife gives him a birdbath; assists with dressing        OT Problem List: Decreased strength;Decreased activity tolerance;Impaired balance (sitting and/or standing);Decreased coordination;Decreased cognition;Decreased safety awareness;Decreased knowledge of use of DME or AE      OT Treatment/Interventions: Self-care/ADL training;Therapeutic exercise;Energy conservation;DME and/or AE instruction;Therapeutic activities;Cognitive remediation/compensation;Patient/family education;Balance training    OT Goals(Current goals can be found in the care plan section) Acute Rehab OT Goals Patient Stated Goal: None stated - wife stated she wanted him to be able to walk again, atleast assisted with a walker. OT Goal Formulation: With family Time For Goal Achievement: 01/09/21 Potential to Achieve Goals: Fair ADL Goals Pt Will Perform Grooming: with min assist;sitting Pt Will Perform Lower  Body Bathing: with mod assist;sitting/lateral leans;sit to/from stand Pt Will Perform Lower Body Dressing: with mod assist;sitting/lateral leans;sit to/from stand Pt Will Transfer to Toilet: with min assist;stand pivot transfer Pt Will Perform Toileting - Clothing  Manipulation and hygiene: with mod assist;sitting/lateral leans;sit to/from stand Additional ADL Goal #1: Pt will follow 1 step commands 75% of the time.  OT Frequency: Min 2X/week   Barriers to D/C:    Wife has no assist at home and is unable to provide physical assist needed to help pt       Co-evaluation PT/OT/SLP Co-Evaluation/Treatment: Yes Reason for Co-Treatment: Necessary to address cognition/behavior during functional activity;For patient/therapist safety PT goals addressed during session: Mobility/safety with mobility;Balance OT goals addressed during session: ADL's and self-care;Strengthening/ROM      AM-PAC OT "6 Clicks" Daily Activity     Outcome Measure Help from another person eating meals?: A Lot Help from another person taking care of personal grooming?: A Lot Help from another person toileting, which includes using toliet, bedpan, or urinal?: A Lot Help from another person bathing (including washing, rinsing, drying)?: A Lot Help from another person to put on and taking off regular upper body clothing?: A Lot Help from another person to put on and taking off regular lower body clothing?: A Lot 6 Click Score: 12   End of Session Equipment Utilized During Treatment: Gait belt Nurse Communication: Mobility status  Activity Tolerance: Patient limited by lethargy Patient left: in bed;with call bell/phone within reach;with bed alarm set;with restraints reapplied;with family/visitor present  OT Visit Diagnosis: Unsteadiness on feet (R26.81);Other abnormalities of gait and mobility (R26.89);Muscle weakness (generalized) (M62.81);Other symptoms and signs involving cognitive function                Time:  1115-1138 OT Time Calculation (min): 23 min Charges:  OT General Charges $OT Visit: 1 Visit OT Evaluation $OT Eval Moderate Complexity: 1 Mod  Shanara Schnieders H., OTR/L Acute Rehabilitation  Kieran Arreguin Elane Yolanda Bonine 12/26/2020, 2:13 PM

## 2020-12-27 ENCOUNTER — Inpatient Hospital Stay (HOSPITAL_COMMUNITY): Payer: Medicare Other

## 2020-12-27 DIAGNOSIS — G9341 Metabolic encephalopathy: Secondary | ICD-10-CM | POA: Diagnosis not present

## 2020-12-27 LAB — CBC WITH DIFFERENTIAL/PLATELET
Abs Immature Granulocytes: 0.04 10*3/uL (ref 0.00–0.07)
Basophils Absolute: 0 10*3/uL (ref 0.0–0.1)
Basophils Relative: 0 %
Eosinophils Absolute: 0.1 10*3/uL (ref 0.0–0.5)
Eosinophils Relative: 1 %
HCT: 39.4 % (ref 39.0–52.0)
Hemoglobin: 13.1 g/dL (ref 13.0–17.0)
Immature Granulocytes: 0 %
Lymphocytes Relative: 14 %
Lymphs Abs: 1.6 10*3/uL (ref 0.7–4.0)
MCH: 32.3 pg (ref 26.0–34.0)
MCHC: 33.2 g/dL (ref 30.0–36.0)
MCV: 97.3 fL (ref 80.0–100.0)
Monocytes Absolute: 1.2 10*3/uL — ABNORMAL HIGH (ref 0.1–1.0)
Monocytes Relative: 11 %
Neutro Abs: 8.5 10*3/uL — ABNORMAL HIGH (ref 1.7–7.7)
Neutrophils Relative %: 74 %
Platelets: 158 10*3/uL (ref 150–400)
RBC: 4.05 MIL/uL — ABNORMAL LOW (ref 4.22–5.81)
RDW: 12.6 % (ref 11.5–15.5)
WBC: 11.6 10*3/uL — ABNORMAL HIGH (ref 4.0–10.5)
nRBC: 0 % (ref 0.0–0.2)

## 2020-12-27 LAB — BLOOD GAS, VENOUS
Acid-Base Excess: 1.3 mmol/L (ref 0.0–2.0)
Bicarbonate: 26.1 mmol/L (ref 20.0–28.0)
FIO2: 21
O2 Saturation: 77 %
Patient temperature: 37.1
pCO2, Ven: 46.8 mmHg (ref 44.0–60.0)
pH, Ven: 7.365 (ref 7.250–7.430)
pO2, Ven: 44.5 mmHg (ref 32.0–45.0)

## 2020-12-27 LAB — BASIC METABOLIC PANEL
Anion gap: 7 (ref 5–15)
BUN: 15 mg/dL (ref 8–23)
CO2: 25 mmol/L (ref 22–32)
Calcium: 8.4 mg/dL — ABNORMAL LOW (ref 8.9–10.3)
Chloride: 106 mmol/L (ref 98–111)
Creatinine, Ser: 0.95 mg/dL (ref 0.61–1.24)
GFR, Estimated: >60 mL/min (ref >60)
Glucose, Bld: 87 mg/dL (ref 70–99)
Potassium: 3.8 mmol/L (ref 3.5–5.1)
Sodium: 138 mmol/L (ref 135–145)

## 2020-12-27 LAB — GLUCOSE, CAPILLARY
Glucose-Capillary: 104 mg/dL — ABNORMAL HIGH (ref 70–99)
Glucose-Capillary: 120 mg/dL — ABNORMAL HIGH (ref 70–99)
Glucose-Capillary: 140 mg/dL — ABNORMAL HIGH (ref 70–99)
Glucose-Capillary: 78 mg/dL (ref 70–99)
Glucose-Capillary: 82 mg/dL (ref 70–99)
Glucose-Capillary: 97 mg/dL (ref 70–99)

## 2020-12-27 MED ORDER — LACTATED RINGERS IV SOLN: INTRAVENOUS | Status: AC

## 2020-12-27 MED ORDER — LACTATED RINGERS IV BOLUS
500.0000 mL | Freq: Once | INTRAVENOUS | Status: AC
  Administered 2020-12-27: 500 mL via INTRAVENOUS

## 2020-12-27 MED ORDER — LACTATED RINGERS IV BOLUS: 1000.0000 mL | Freq: Once | INTRAVENOUS | Status: DC

## 2020-12-27 MED ORDER — IOHEXOL 350 MG/ML SOLN
75.0000 mL | Freq: Once | INTRAVENOUS | Status: AC | PRN
  Administered 2020-12-27: 75 mL via INTRAVENOUS

## 2020-12-27 MED ORDER — LACTATED RINGERS IV BOLUS
1000.0000 mL | Freq: Once | INTRAVENOUS | Status: AC
  Administered 2020-12-27: 1000 mL via INTRAVENOUS

## 2020-12-27 MED ORDER — LACTATED RINGERS IV BOLUS: 500.0000 mL | Freq: Once | INTRAVENOUS | Status: DC

## 2020-12-27 MED ORDER — SODIUM CHLORIDE 0.9 % IV SOLN
3.0000 g | Freq: Four times a day (QID) | INTRAVENOUS | Status: DC
  Administered 2020-12-27 – 2020-12-31 (×17): 3 g via INTRAVENOUS
  Filled 2020-12-27 (×17): qty 8

## 2020-12-27 NOTE — Progress Notes (Signed)
Patient with [facility] at [place]. Vitals meeting MEWS criteria: HR of 106 and Respirations of 26. On call provider, Hoyle Sauer, notified. Will continue to monitor and check vitals Q 4 hours per protocol.

## 2020-12-27 NOTE — NC FL2 (Signed)
[place] MEDICAID FL2 LEVEL OF CARE SCREENING TOOL     IDENTIFICATION  Patient Name: Ralph West Birthdate: 12/10/1932 Sex: male Admission Date (Current Location): 12/24/2020  [facility] and [place] Number:  Herbalist and Address:  [facility], [place] [address] [place], [place] [address]      Provider Number: [id number]  Attending Physician Name and Address:  Axel Filler, *  Relative Name and Phone Number:  Kypton Eltringham- wife [phone]    Current Level of Care: Hospital Recommended Level of Care: [place] Prior Approval Number:    Date Approved/Denied:   PASRR Number: [id number]  Discharge Plan: SNF    Current Diagnoses: Patient Active Problem List   Diagnosis Date Noted   Dementia ([place]) 09/38/1829   Acute metabolic encephalopathy 93/71/6967   Anxiety, generalized 07/08/2020   Diabetes mellitus without complication ([place]) 89/38/1017   Hernia, umbilical 51/02/5850   Hyperlipidemia 07/08/2020   Hypertension 07/08/2020   Kidney stones 07/08/2020    Orientation RESPIRATION BLADDER Height & Weight     Self  Normal Incontinent Weight: 75 kg Height:  5\' 5"  (165.1 cm)  BEHAVIORAL SYMPTOMS/MOOD NEUROLOGICAL BOWEL NUTRITION STATUS    Convulsions/Seizures, Grand mal (Had one seizure in ED) Incontinent Diet (See DC summary)  AMBULATORY STATUS COMMUNICATION OF NEEDS Skin   Total Care Verbally Normal                       Personal Care Assistance Level of Assistance  Total care       Total Care Assistance: Maximum assistance   Functional Limitations Info             SPECIAL CARE FACTORS FREQUENCY  PT (By licensed PT), OT (By licensed OT)     PT Frequency: 5 x a week OT Frequency: 5 times a week            Contractures Contractures Info: Not present    Additional Factors Info  Code Status Code Status Info: Full             Current Medications (12/27/2020):  This is the current  hospital active medication list Current Facility-Administered Medications  Medication Dose Route Frequency Provider Last Rate Last Admin   acetaminophen (TYLENOL) tablet 650 mg  650 mg Oral Q6H PRN Lacinda Axon, MD       Or   acetaminophen (TYLENOL) suppository 650 mg  650 mg Rectal Q6H PRN Lacinda Axon, MD       Ampicillin-Sulbactam (UNASYN) 3 g in sodium chloride 0.9 % 100 mL IVPB  3 g Intravenous Q6H Demaio, Alexa, MD 200 mL/hr at 12/27/20 1320 3 g at 12/27/20 1320   aspirin EC tablet 81 mg  81 mg Oral Daily Timothy Lasso, MD   81 mg at 12/27/20 1011   donepezil (ARICEPT) tablet 10 mg  10 mg Oral Daily Lacinda Axon, MD   10 mg at 12/27/20 1011   enoxaparin (LOVENOX) injection 40 mg  40 mg Subcutaneous Q24H Iona Beard, MD   40 mg at 12/26/20 1737   insulin aspart (novoLOG) injection 0-15 Units  0-15 Units Subcutaneous Q4H Lacinda Axon, MD   3 Units at 12/26/20 2109   lacosamide (VIMPAT) 100 mg in sodium chloride 0.9 % 25 mL IVPB  100 mg Intravenous Q12H Rikki Spearing, NP 70 mL/hr at 12/27/20 1103 100 mg at 12/27/20 1103   lactated ringers infusion   Intravenous Continuous [place],  Rachell Cipro, MD 100 mL/hr at 12/27/20 1316 New Bag at 12/27/20 1316   metoprolol tartrate (LOPRESSOR) tablet 25 mg  25 mg Oral Q6H Rick Duff, MD   25 mg at 12/27/20 1011   nitroGLYCERIN (NITROSTAT) SL tablet 0.4 mg  0.4 mg Sublingual Q5 min PRN Lacinda Axon, MD       ondansetron [facility]) tablet 4 mg  4 mg Oral Q6H PRN Lacinda Axon, MD       Or   ondansetron [facility]) injection 4 mg  4 mg Intravenous Q6H PRN Lacinda Axon, MD       rosuvastatin (CRESTOR) tablet 5 mg  5 mg Oral Daily Lacinda Axon, MD   5 mg at 12/27/20 1011   senna-docusate (Senokot-S) tablet 1 tablet  1 tablet Oral QHS PRN Lacinda Axon, MD       sertraline (ZOLOFT) tablet 25 mg  25 mg Oral Daily Lacinda Axon, MD   25 mg at 12/27/20 1011     Discharge  Medications: Please see discharge summary for a list of discharge medications.  Relevant Imaging Results:  Relevant Lab Results:   Additional Information SSN# [id number]  Verdell Carmine, RN

## 2020-12-27 NOTE — TOC Initial Note (Signed)
Transition of Care [facility]) - Initial/Assessment Note    Patient Details  Name: Ralph West MRN: [id number] Date of Birth: 1932/10/18  [facility]) CM/SW Contact:    Verdell Carmine, RN Phone Number: 12/27/2020, 1:45 PM  Clinical Narrative:                  87 yo male  presents with  acute agitation, confusion and dysphagia. Has a history of anxiety DMII HPTN and dementia. Patient had a witnessed seizure by staff in ED. Improved agitation with Haldol and zyprexa.  PT and OT evaluation reveals recommendation with SNF placement. Called and spoke to wife, who states he has been to [facility] before, and that is her preferred choice. She agrees to SNF placement. , and is willing to have information sent to closest to her home SNF's  [facility] will follow for needs, recommendations, and transitions of care. Expected Discharge Plan: Skilled Nursing Facility Barriers to Discharge: Continued Medical Work up   Patient Goals and CMS Choice     Choice offered to / list presented to : Spouse  Expected Discharge Plan and Services Expected Discharge Plan: [place]   Discharge Planning Services: CM Consult Post Acute Care Choice: [place] Living arrangements for the past 2 months: Single Family Home                                      Prior Living Arrangements/Services Living arrangements for the past 2 months: Single Family Home Lives with:: Spouse Patient language and need for interpreter reviewed:: Yes        Need for Family Participation in Patient Care: Yes (Comment) Care giver support system in place?: Yes (comment)   Criminal Activity/Legal Involvement Pertinent to Current Situation/Hospitalization: No - Comment as needed  Activities of Daily Living Home Assistive Devices/Equipment: Eyeglasses ADL Screening (condition at time of admission) Patient's cognitive ability adequate to safely complete daily activities?: No Is the patient  deaf or have difficulty hearing?: Yes Does the patient have difficulty seeing, even when wearing glasses/contacts?: No Does the patient have difficulty concentrating, remembering, or making decisions?: Yes Patient able to express need for assistance with ADLs?: Yes Does the patient have difficulty dressing or bathing?: Yes Independently performs ADLs?: No Communication: Independent Dressing (OT): Needs assistance Is this a change from baseline?: Pre-admission baseline Grooming: Needs assistance Is this a change from baseline?: Pre-admission baseline Feeding: Independent Bathing: Needs assistance Is this a change from baseline?: Pre-admission baseline Toileting: Needs assistance In/Out Bed: Needs assistance Walks in Home: Independent Does the patient have difficulty walking or climbing stairs?: No Weakness of Legs: Both Weakness of Arms/Hands: None  Permission Sought/Granted                  Emotional Assessment     Affect (typically observed): Agitated Orientation: : Fluctuating Orientation (Suspected and/or reported Sundowners) Alcohol / Substance Use: Not Applicable Psych Involvement: No (comment)  Admission diagnosis:  Confusion [R41.0] Seizure ([place]) [R56.9] Dyspnea [R06.00] New onset a-fib ([place]) [I48.91] Dysarthria [S34.1] Acute metabolic encephalopathy [D62.22] Dementia ([place]) [F03.90] Patient Active Problem List   Diagnosis Date Noted   Dementia ([place]) 97/98/9211   Acute metabolic encephalopathy 94/17/4081   Anxiety, generalized 07/08/2020   Diabetes mellitus without complication ([place]) 44/81/8563   Hernia, umbilical 14/97/0263   Hyperlipidemia 07/08/2020   Hypertension 07/08/2020   Kidney stones 07/08/2020   PCP:  Townsend Roger, MD Pharmacy:   [place], [place] - [place] [address] [place] [address] Phone: [phone] Fax: [phone]     Social Determinants of Health (SDOH) Interventions     Readmission Risk Interventions No flowsheet data found.

## 2020-12-27 NOTE — Progress Notes (Signed)
SLP Cancellation Note  Patient Details Name: Ralph West MRN: [id number] DOB: 03-Feb-1932   Cancelled treatment:       Reason Eval/Treat Not Completed: Fatigue/lethargy limiting ability to participate (Pt inadequately alert to participate in treatment. Pt responded briefly to verbal stimuli, but fell asleep soon thereafter. Pt's wife reported that pt has been tolerating p.o. without difficulty. SLP will follow up on a subsequent date.)  Magdelene Ruark I. Hardin Negus, [place], [place] Office number [phone] Pager [place] 12/27/2020, 9:47 AM

## 2020-12-27 NOTE — Progress Notes (Addendum)
HD#2 SUBJECTIVE:  Patient Summary: Ralph West is a 87 y.o. with a pertinent PMH of diabetes hyperlipidemia hypertension Alzheimer's MI, who presented with seizure-like activity and admitted for acute metabolic encephalopathy.   Overnight Events: No acute events overnight    Interm History: CT angio obtained due to concern for possible PE given elevated heart rate and tachycardia and elevated D-dimer.  Fortunately no pulmonary embolus noted on imaging.  Imaging did reveal bilateral lung opacities consistent with possible aspiration pneumonia.  11 mm spiculated lung mass also seen on imaging.  OBJECTIVE:  Vital Signs: Vitals:   12/27/20 0000 12/27/20 0342 12/27/20 0731 12/27/20 1231  BP: 130/89 98/64 (!) 96/55 (!) 88/49  Pulse: (!) 130 75 88 74  Resp: (!) 22 (!) 22 (!) 27 (!) 29  Temp:  99.8 F (37.7 C) 99.2 F (37.3 C) 98.8 F (37.1 C)  TempSrc:  Axillary Axillary Axillary  SpO2: 96% 97% 96% 94%  Weight:      Height:       Supplemental O2: Room Air SpO2: 94 % O2 Flow Rate (L/min): 2 L/min  Filed Weights   12/24/20 0900  Weight: 75 kg     Intake/Output Summary (Last 24 hours) at 12/27/2020 1317 Last data filed at 12/27/2020 1039 Gross per 24 hour  Intake 540.59 ml  Output 1400 ml  Net -859.41 ml   Net IO Since Admission: 313.13 mL [12/27/20 1317]  Physical Exam:  Physical Exam Constitutional:      General: He is not in acute distress.    Appearance: He is normal weight. He is ill-appearing. He is not toxic-appearing or diaphoretic.  HENT:     Head: Atraumatic.     Mouth/Throat:     Mouth: Mucous membranes are dry.  Eyes:     Pupils: Pupils are equal, round, and reactive to light.  Cardiovascular:     Rate and Rhythm: Normal rate and regular rhythm.     Heart sounds: Normal heart sounds.  Pulmonary:     Effort: No respiratory distress.     Breath sounds: Normal breath sounds. No wheezing, rhonchi or rales.     Comments: Patient does not appear to be in  respiratory distress, however he is taking shallow breaths with occasional apneic spells.  Saturating 94% 2 L Chest:     Chest wall: No tenderness.  Abdominal:     General: Abdomen is flat. Bowel sounds are normal. There is no distension.     Palpations: Abdomen is soft.  Musculoskeletal:        General: Normal range of motion.     Cervical back: Normal range of motion and neck supple.  Skin:    General: Skin is warm and dry.  Neurological:     General: No focal deficit present.     Comments: Patient difficult to arouse during exam.  Psychiatric:     Comments: Unable to assess due to patient's level of consciousness.    Patient Lines/Drains/Airways Status     Active Line/Drains/Airways     Name Placement date Placement time Site Days   Peripheral IV 12/24/20 18 G Anterior;Distal;Left Forearm 12/24/20  1033  Forearm  2   External Urinary Catheter 12/25/20  1200  --  1            Pertinent Labs: CBC Latest Ref Rng & Units 12/27/2020 12/26/2020 12/25/2020  WBC 4.0 - 10.5 K/uL 11.6(H) 10.1 11.6(H)  Hemoglobin 13.0 - 17.0 g/dL 13.1 13.9 13.4  Hematocrit 39.0 -  52.0 % 39.4 41.7 40.2  Platelets 150 - 400 K/uL 158 167 154    CMP Latest Ref Rng & Units 12/27/2020 12/26/2020 12/25/2020  Glucose 70 - 99 mg/dL 87 139(H) 117(H)  BUN 8 - 23 mg/dL 15 13 13   Creatinine 0.61 - 1.24 mg/dL 0.95 1.00 0.85  Sodium 135 - 145 mmol/L 138 141 138  Potassium 3.5 - 5.1 mmol/L 3.8 3.8 3.5  Chloride 98 - 111 mmol/L 106 107 106  CO2 22 - 32 mmol/L 25 26 25   Calcium 8.9 - 10.3 mg/dL 8.4(L) 8.8(L) 8.5(L)  Total Protein 6.5 - 8.1 g/dL - 5.8(L) -  Total Bilirubin 0.3 - 1.2 mg/dL - 2.3(H) -  Alkaline Phos 38 - 126 U/L - 72 -  AST 15 - 41 U/L - 112(H) -  ALT 0 - 44 U/L - 46(H) -    Recent Labs    12/27/20 0345 12/27/20 0820 12/27/20 1231  GLUCAP 82 97 104*      ASSESSMENT/PLAN:   Assessment:  Ralph West is a 87 y.o. with pertinent PMH of Alzheimer's, hypertension, hyperlipidemia who  presented with jerking/seizure-like activity and admit for acute encephalopathy on hospital day 2  Plan: #Acute encephalopathy secondary to seizure disorder. Although the patient appeared improved yesterday and was awake answering questions, today he is very drowsy today and does not arouse easily during exam.  Initially felt as though patient's altered mental status was secondary to recent seizures, however given findings on CT angio, feel his altered mental status more likely caused by aspiration pneumonia.  He does have a slight elevation in his white count Continue Vimpat and seizure precautions. Ativan as needed for any seizure activity amphetamines. Continue Aricept  #Aspiration pneumonia #Sinus tachycardia. Patient has a mild leukocytosis, crackles noted in the lungs bilaterally, and bilateral pleural effusions with opacities seen in the lungs CT scanning of the chest.  There is a high concern for aspiration pneumonia given the patient's decreased mental status as well as seizures. He has been saturating mid to low 90s on 2 L nasal cannula.  On physical exam he has been taking short shallow breaths with occasional apneic episodes. Due to patient's decreased altered mental status, venous blood gas was obtained to evaluate for hypercapnia.  Fortunately blood gas has returned within normal limits. Underlying pneumonia likely driving his altered mental status.  Furthermore patient has periodically had episodes of tachycardia.  After initial evaluation feel that his tachycardia is likely related to underlying infection as well as a component of dehydration as patient has had some poor p.o. intake and appears clinically dry.  Furthermore he has had some hypotension.  He has been started on IV fluids and will receive bolus as well. -Continue IV fluids - Will treat infection with Unasyn. -Continue supplemental oxygen -Patient appears ill and will require close monitoring.  #Spiculated opacity left  upper lobe Remote history of smoking Seen on CT angio chest This can likely be worked up as an outpatient with Noncon CT in 3 months.  #Alzheimer's dementia Continue donepezil. Physical therapy evaluated patient recommending short-term skilled nursing rehab.  Hyperlipidemia continue statin  Hypertension Continue metoprolol.  Best Practice: IVF: Fluids: LR,  VTE: enoxaparin (LOVENOX) injection 40 mg Start: 12/26/20 1445 Code: Full Family Contact: wife, at bedside. Dissipate discharge approximately 1 to 2 days  Signature: Delene Ruffini, MD  Internal Medicine Resident, PGY-1 [facility]  Pager: [phone] 1:17 PM, 12/27/2020   Please contact the on call pager after 5  pm and on weekends at [phone].

## 2020-12-27 NOTE — Progress Notes (Addendum)
Pt MEWS red. Mews has been previously red. Implemented Q1 hour vitals implemented.  MD Danton Sewer notified. See new orders . Pt alseep , resting comfortably. Will continue to monitor . Charge nurse Elmyra Ricks RN notified was here the previous night as well and aware of red MEWS. See prior notes.   12/26/20 2000  Assess: MEWS Score  BP 124/77  Pulse Rate (!) 116  ECG Heart Rate (!) 116  Resp (!) 33  Level of Consciousness Alert  SpO2 97 %  Assess: MEWS Score  MEWS Temp 0  MEWS Systolic 0  MEWS Pulse 2  MEWS RR 2  MEWS LOC 0  MEWS Score 4  MEWS Score Color Red  Assess: if the MEWS score is Yellow or Red  Were vital signs taken at a resting state? Yes  Focused Assessment No change from prior assessment  Early Detection of Sepsis Score *See Row Information* Low  MEWS guidelines implemented *See Row Information* Yes  Treat  Pain Scale 0-10  Pain Score 0  Take Vital Signs  Increase Vital Sign Frequency  Red: Q 1hr X 4 then Q 4hr X 4, if remains red, continue Q 4hrs  Escalate  MEWS: Escalate Red: discuss with charge nurse/RN and provider, consider discussing with RRT  Notify: Charge Nurse/RN  Name of Charge Nurse/RN Notified Elmyra Ricks RN  Date Charge Nurse/RN Notified 12/26/20  Notify: Provider  Provider Name/Title Danton Sewer  Date Provider Notified 12/27/20  Time Provider Notified 0015  Notification Type Page  Notification Reason Other (Comment) (Red Mews, pt has had previously red Mews)  Provider response See new orders  Date of Provider Response 12/27/20  Time of Provider Response [phone]

## 2020-12-27 NOTE — Progress Notes (Signed)
Subjective: Patient is asleep on my arrival into room. Patient's wife is at the bedside. Patient arouses with loud voice and constant stimulation. He has a poor attention span and requires constant stimulation due to drifting back to sleep during exam. His speech is garbled and at times very hard to understand. He is able to state his name and able to identify his wife by name. Will follow very simple commands. Patient is noted to be in bilateral wrist restraints.   Objective: Current vital signs: BP (!) 96/55 (BP Location: Left Arm)   Pulse 88   Temp 99.2 F (37.3 C) (Axillary)   Resp (!) 27   Ht 5\' 5"  (1.651 m)   Wt 75 kg   SpO2 96%   BMI 27.51 kg/m  Vital signs in last 24 hours: Temp:  [98.9 F (37.2 C)-100 F (37.8 C)] 99.2 F (37.3 C) (12/02 0731) Pulse Rate:  [75-141] 88 (12/02 0731) Resp:  [14-39] 27 (12/02 0731) BP: (96-142)/(55-103) 96/55 (12/02 0731) SpO2:  [93 %-97 %] 96 % (12/02 0731)  Intake/Output from previous day: 12/01 0701 - 12/02 0700 In: 540.6 [IV Piggyback:540.6] Out: 800 [Urine:800] Intake/Output this shift: No intake/output data recorded. Nutritional status:  Diet Order             DIET DYS 2 Room service appropriate? No; Fluid consistency: Thin  Diet effective now                   Neurologic Exam:  Temp:  [98.9 F (37.2 C)-100 F (37.8 C)] 99.2 F (37.3 C) (12/02 0731) Pulse Rate:  [75-141] 88 (12/02 0731) Resp:  [18-39] 27 (12/02 0731) BP: (96-137)/(55-103) 96/55 (12/02 0731) SpO2:  [94 %-97 %] 96 % (12/02 0731)  General - Well nourished, well developed, in no apparent distress. Ophthalmologic - fundi not visualized due to noncooperation. Cardiovascular - Regular rhythm and rate.  Neuro: Mental Status -  Patient is lethargic, arouses with loud voice and repeated stimulation, however does not stay awake very long. He is able to tell me his name and identify his wife. Has poor attention span, follows simple commands  Cranial  Nerves II - XII - II - Visual field intact OU. III, IV, VI - Extraocular movements intact. V - Facial sensation intact bilaterally. VII - Facial movement intact bilaterally. VIII - Hearing & vestibular intact bilaterally. X -unable to assess XI - Chin turning & shoulder shrug intact bilaterally. XII - Tongue protrusion intact.  Motor Strength - The patient's strength was normal in all extremities and pronator drift was absent.  Bulk was normal and fasciculations were absent.   Motor Tone - Muscle tone was assessed at the neck and appendages and was normal.  Sensory - Light touch, temperature/pinprick were assessed and were symmetrical.    Coordination - The patient had normal movements in the hands and feet with no ataxia or dysmetria.  Tremor was absent.  Gait and Station - deferred.   Lab Results: Results for orders placed or performed during the hospital encounter of 12/24/20 (from the past 48 hour(s))  CBG monitoring, ED     Status: Abnormal   Collection Time: 12/25/20 11:48 AM  Result Value Ref Range   Glucose-Capillary 134 (H) 70 - 99 mg/dL    Comment: Glucose reference range applies only to samples taken after fasting for at least 8 hours.  Glucose, capillary     Status: Abnormal   Collection Time: 12/25/20 12:43 PM  Result Value Ref Range  Glucose-Capillary 115 (H) 70 - 99 mg/dL    Comment: Glucose reference range applies only to samples taken after fasting for at least 8 hours.  Glucose, capillary     Status: Abnormal   Collection Time: 12/25/20  3:59 PM  Result Value Ref Range   Glucose-Capillary 142 (H) 70 - 99 mg/dL    Comment: Glucose reference range applies only to samples taken after fasting for at least 8 hours.  Glucose, capillary     Status: Abnormal   Collection Time: 12/25/20  8:00 PM  Result Value Ref Range   Glucose-Capillary 113 (H) 70 - 99 mg/dL    Comment: Glucose reference range applies only to samples taken after fasting for at least 8 hours.   Glucose, capillary     Status: Abnormal   Collection Time: 12/26/20 12:12 AM  Result Value Ref Range   Glucose-Capillary 103 (H) 70 - 99 mg/dL    Comment: Glucose reference range applies only to samples taken after fasting for at least 8 hours.  Glucose, capillary     Status: Abnormal   Collection Time: 12/26/20  3:47 AM  Result Value Ref Range   Glucose-Capillary 113 (H) 70 - 99 mg/dL    Comment: Glucose reference range applies only to samples taken after fasting for at least 8 hours.  Glucose, capillary     Status: Abnormal   Collection Time: 12/26/20  7:56 AM  Result Value Ref Range   Glucose-Capillary 128 (H) 70 - 99 mg/dL    Comment: Glucose reference range applies only to samples taken after fasting for at least 8 hours.  CBC with Differential/Platelet     Status: None   Collection Time: 12/26/20  8:11 AM  Result Value Ref Range   WBC 10.1 4.0 - 10.5 K/uL   RBC 4.32 4.22 - 5.81 MIL/uL   Hemoglobin 13.9 13.0 - 17.0 g/dL   HCT 41.7 39.0 - 52.0 %   MCV 96.5 80.0 - 100.0 fL   MCH 32.2 26.0 - 34.0 pg   MCHC 33.3 30.0 - 36.0 g/dL   RDW 12.5 11.5 - 15.5 %   Platelets 167 150 - 400 K/uL   nRBC 0.0 0.0 - 0.2 %   Neutrophils Relative % 72 %   Neutro Abs 7.2 1.7 - 7.7 K/uL   Lymphocytes Relative 18 %   Lymphs Abs 1.8 0.7 - 4.0 K/uL   Monocytes Relative 9 %   Monocytes Absolute 0.9 0.1 - 1.0 K/uL   Eosinophils Relative 1 %   Eosinophils Absolute 0.1 0.0 - 0.5 K/uL   Basophils Relative 0 %   Basophils Absolute 0.0 0.0 - 0.1 K/uL   Immature Granulocytes 0 %   Abs Immature Granulocytes 0.03 0.00 - 0.07 K/uL    Comment: Performed at [facility], [address], [place], [place] [address]  Comprehensive metabolic panel     Status: Abnormal   Collection Time: 12/26/20  8:11 AM  Result Value Ref Range   Sodium 141 135 - 145 mmol/L   Potassium 3.8 3.5 - 5.1 mmol/L   Chloride 107 98 - 111 mmol/L   CO2 26 22 - 32 mmol/L   Glucose, Bld 139 (H) 70 - 99 mg/dL    Comment:  Glucose reference range applies only to samples taken after fasting for at least 8 hours.   BUN 13 8 - 23 mg/dL   Creatinine, Ser 1.00 0.61 - 1.24 mg/dL   Calcium 8.8 (L) 8.9 - 10.3 mg/dL  Total Protein 5.8 (L) 6.5 - 8.1 g/dL   Albumin 3.1 (L) 3.5 - 5.0 g/dL   AST 112 (H) 15 - 41 U/L   ALT 46 (H) 0 - 44 U/L   Alkaline Phosphatase 72 38 - 126 U/L   Total Bilirubin 2.3 (H) 0.3 - 1.2 mg/dL   GFR, Estimated >60 >60 mL/min    Comment: (NOTE) Calculated using the CKD-EPI Creatinine Equation (2021)    Anion gap 8 5 - 15    Comment: Performed at [facility] [address], [place], [place] [address]  Glucose, capillary     Status: Abnormal   Collection Time: 12/26/20 12:05 PM  Result Value Ref Range   Glucose-Capillary 108 (H) 70 - 99 mg/dL    Comment: Glucose reference range applies only to samples taken after fasting for at least 8 hours.  D-dimer, quantitative     Status: Abnormal   Collection Time: 12/26/20  2:08 PM  Result Value Ref Range   D-Dimer, Quant 1.39 (H) 0.00 - 0.50 ug/mL-FEU    Comment: (NOTE) At the manufacturer cut-off value of 0.5 g/mL FEU, this assay has a negative predictive value of 95-100%.This assay is intended for use in conjunction with a clinical pretest probability (PTP) assessment model to exclude pulmonary embolism (PE) and deep venous thrombosis (DVT) in outpatients suspected of PE or DVT. Results should be correlated with clinical presentation. Performed at [facility], [place] [address] [place], [place] [address]   Glucose, capillary     Status: Abnormal   Collection Time: 12/26/20  4:08 PM  Result Value Ref Range   Glucose-Capillary 107 (H) 70 - 99 mg/dL    Comment: Glucose reference range applies only to samples taken after fasting for at least 8 hours.  Glucose, capillary     Status: Abnormal   Collection Time: 12/26/20  7:49 PM  Result Value Ref Range   Glucose-Capillary 181 (H) 70 - 99 mg/dL    Comment: Glucose reference range  applies only to samples taken after fasting for at least 8 hours.  Glucose, capillary     Status: None   Collection Time: 12/27/20 12:03 AM  Result Value Ref Range   Glucose-Capillary 78 70 - 99 mg/dL    Comment: Glucose reference range applies only to samples taken after fasting for at least 8 hours.  CBC with Differential/Platelet     Status: Abnormal   Collection Time: 12/27/20  1:09 AM  Result Value Ref Range   WBC 11.6 (H) 4.0 - 10.5 K/uL   RBC 4.05 (L) 4.22 - 5.81 MIL/uL   Hemoglobin 13.1 13.0 - 17.0 g/dL   HCT 39.4 39.0 - 52.0 %   MCV 97.3 80.0 - 100.0 fL   MCH 32.3 26.0 - 34.0 pg   MCHC 33.2 30.0 - 36.0 g/dL   RDW 12.6 11.5 - 15.5 %   Platelets 158 150 - 400 K/uL   nRBC 0.0 0.0 - 0.2 %   Neutrophils Relative % 74 %   Neutro Abs 8.5 (H) 1.7 - 7.7 K/uL   Lymphocytes Relative 14 %   Lymphs Abs 1.6 0.7 - 4.0 K/uL   Monocytes Relative 11 %   Monocytes Absolute 1.2 (H) 0.1 - 1.0 K/uL   Eosinophils Relative 1 %   Eosinophils Absolute 0.1 0.0 - 0.5 K/uL   Basophils Relative 0 %   Basophils Absolute 0.0 0.0 - 0.1 K/uL   Immature Granulocytes 0 %   Abs Immature Granulocytes 0.04  0.00 - 0.07 K/uL    Comment: Performed at [facility], [place] [address] [place], [place] [address]  Basic metabolic panel     Status: Abnormal   Collection Time: 12/27/20  1:09 AM  Result Value Ref Range   Sodium 138 135 - 145 mmol/L   Potassium 3.8 3.5 - 5.1 mmol/L   Chloride 106 98 - 111 mmol/L   CO2 25 22 - 32 mmol/L   Glucose, Bld 87 70 - 99 mg/dL    Comment: Glucose reference range applies only to samples taken after fasting for at least 8 hours.   BUN 15 8 - 23 mg/dL   Creatinine, Ser 0.95 0.61 - 1.24 mg/dL   Calcium 8.4 (L) 8.9 - 10.3 mg/dL   GFR, Estimated >60 >60 mL/min    Comment: (NOTE) Calculated using the CKD-EPI Creatinine Equation (2021)    Anion gap 7 5 - 15    Comment: Performed at [facility] [address], [place], [place] [address]  Glucose, capillary      Status: None   Collection Time: 12/27/20  3:45 AM  Result Value Ref Range   Glucose-Capillary 82 70 - 99 mg/dL    Comment: Glucose reference range applies only to samples taken after fasting for at least 8 hours.    Recent Results (from the past 240 hour(s))  Resp Panel by RT-PCR (Flu A&B, Covid) Nasopharyngeal Swab     Status: None   Collection Time: 12/24/20 10:27 AM   Specimen: Nasopharyngeal Swab; Nasopharyngeal(NP) swabs in vial transport medium  Result Value Ref Range Status   SARS Coronavirus 2 by RT PCR NEGATIVE NEGATIVE Final    Comment: (NOTE) SARS-CoV-2 target nucleic acids are NOT DETECTED.  The SARS-CoV-2 RNA is generally detectable in upper respiratory specimens during the acute phase of infection. The lowest concentration of SARS-CoV-2 viral copies this assay can detect is 138 copies/mL. A negative result does not preclude SARS-Cov-2 infection and should not be used as the sole basis for treatment or other patient management decisions. A negative result may occur with  improper specimen collection/handling, submission of specimen other than nasopharyngeal swab, presence of viral mutation(s) within the areas targeted by this assay, and inadequate number of viral copies(<138 copies/mL). A negative result must be combined with clinical observations, patient history, and epidemiological information. The expected result is Negative.  Fact Sheet for Patients:  EntrepreneurPulse.com.au  Fact Sheet for Healthcare Providers:  IncredibleEmployment.be  This test is no t yet approved or cleared by the [place] FDA and  has been authorized for detection and/or diagnosis of SARS-CoV-2 by FDA under an Emergency Use Authorization (EUA). This EUA will remain  in effect (meaning this test can be used) for the duration of the COVID-19 declaration under Section 564(b)(1) of the Act, 21 U.S.C.section 360bbb-3(b)(1), unless the authorization is  terminated  or revoked sooner.       Influenza A by PCR NEGATIVE NEGATIVE Final   Influenza B by PCR NEGATIVE NEGATIVE Final    Comment: (NOTE) The Xpert Xpress SARS-CoV-2/FLU/RSV plus assay is intended as an aid in the diagnosis of influenza from Nasopharyngeal swab specimens and should not be used as a sole basis for treatment. Nasal washings and aspirates are unacceptable for Xpert Xpress SARS-CoV-2/FLU/RSV testing.  Fact Sheet for Patients: EntrepreneurPulse.com.au  Fact Sheet for Healthcare Providers: IncredibleEmployment.be  This test is not yet approved or cleared by the [place] FDA and has been authorized for detection and/or diagnosis of  SARS-CoV-2 by FDA under an Emergency Use Authorization (EUA). This EUA will remain in effect (meaning this test can be used) for the duration of the COVID-19 declaration under Section 564(b)(1) of the Act, 21 U.S.C. section 360bbb-3(b)(1), unless the authorization is terminated or revoked.  Performed at [facility], [place] [address] [place], [place] [address]   Urine Culture     Status: Abnormal   Collection Time: 12/24/20  3:02 PM   Specimen: Urine, Clean Catch  Result Value Ref Range Status   Specimen Description URINE, CLEAN CATCH  Final   Special Requests   Final    NONE Performed at [facility], [place] [address], [place], [place] [address]    Culture MULTIPLE SPECIES PRESENT, SUGGEST RECOLLECTION (A)  Final   Report Status 12/26/2020 FINAL  Final    Lipid Panel Recent Labs    12/24/20 1729  CHOL 133  TRIG 62  HDL 51  CHOLHDL 2.6  VLDL 12  LDLCALC 70    Studies/Results: CT Angio Chest Pulmonary Embolism (PE) W or WO Contrast  Result Date: 12/27/2020 CLINICAL DATA:  87 year old male with increase shortness of breath. Abnormal D-dimer. EXAM: CT ANGIOGRAPHY CHEST WITH CONTRAST TECHNIQUE: Multidetector CT imaging of the chest was performed using the standard  protocol during bolus administration of intravenous contrast. Multiplanar CT image reconstructions and MIPs were obtained to evaluate the vascular anatomy. CONTRAST:  4mL OMNIPAQUE IOHEXOL 350 MG/ML SOLN COMPARISON:  Portable chest 12/24/2020. FINDINGS: Cardiovascular: Good contrast bolus timing in the pulmonary arterial tree. No focal filling defect identified in the pulmonary arteries to suggest acute pulmonary embolism. Mild respiratory motion. Calcified aortic atherosclerosis. No contrast in the aorta. No cardiomegaly or pericardial effusion. Calcified coronary artery atherosclerosis is extensive (series 7, image 214). Mediastinum/Nodes: Negative.  No mediastinal lymphadenopathy. Lungs/Pleura: Small layering pleural effusions greater on the left. Atelectatic changes to the major airways. Retained secretions in the dependent right trachea and at the carina (series 6, image 52). But otherwise the major airways are patent. There is confluent bilateral lower lobe opacity, although a paucity of air bronchograms. The superior segments are relatively spared. Additional dependent atelectasis along the major fissures. Superimposed peripheral left upper lobe nodular and spiculated opacity with regional scarring is 10 x 12 mm. See series 6, image 21 and series 8, image 81. Some of the nearby linear scarring is calcified. Upper Abdomen: Absent gallbladder. Negative visible noncontrast liver, spleen, pancreas, adrenal glands, kidneys, and bowel in the upper abdomen. Musculoskeletal: Flowing endplate osteophytes result in widespread thoracic ankylosis. No acute osseous abnormality identified. Chronic eleventh costovertebral junction rib fracture. Review of the MIP images confirms the above findings. IMPRESSION: 1. Negative for acute pulmonary embolus. 2. Small layering pleural effusions, greater on the left, with confluent bilateral lower lobe opacity. Some retained secretions in the trachea. Consider aspiration and/or  pneumonia versus atelectasis. 3. Superimposed peripheral left upper lobe 11 mm spiculated opacity with regional scarring. Consider a non-contrast Chest CT at 3 months, a PET/CT, or tissue sampling. These guidelines do not apply to immunocompromised patients and patients with cancer. Follow up in patients with significant comorbidities as clinically warranted. For lung cancer screening, adhere to Lung-RADS guidelines. Reference: Radiology. 2017; 284(1):228-43. 4. Calcified coronary artery and Aortic Atherosclerosis (ICD10-I70.0). Electronically Signed   By: Genevie Ann M.D.   On: 12/27/2020 07:12   ECHOCARDIOGRAM COMPLETE  Result Date: 12/26/2020    ECHOCARDIOGRAM REPORT   Patient Name:   Ralph West Date of Exam: 12/26/2020 Medical  Rec #:  [id number]    Height:       65.0 in Accession #:    [id number]   Weight:       165.3 lb Date of Birth:  12/01/1932    BSA:          1.824 m Patient Age:    13 years     BP:           137/78 mmHg Patient Gender: M            HR:           82 bpm. Exam Location:  Inpatient Procedure: 2D Echo, Cardiac Doppler, Color Doppler and Intracardiac            Opacification Agent Indications:    CHF  History:        Patient has no prior history of Echocardiogram examinations.                 Risk Factors:Diabetes, Hypertension and Dyslipidemia.  Sonographer:    Maudry Mayhew MHA, RDMS, RVT, RDCS Referring Phys: [id number] [facility]  Sonographer Comments: Image acquisition challenging due to respiratory motion, Image acquisition challenging due to uncooperative patient, Image acquisition challenging due to patient body habitus and patient in wrist restraints. IMPRESSIONS  1. Akinesis of the distal septum/apex; overall preserved LV function.  2. Left ventricular ejection fraction, by estimation, is 50 to 55%. The left ventricle has low normal function. The left ventricle demonstrates regional wall motion abnormalities (see scoring diagram/findings for description). Left ventricular  diastolic  parameters are consistent with Grade II diastolic dysfunction (pseudonormalization). Elevated left atrial pressure.  3. Right ventricular systolic function is normal. The right ventricular size is normal.  4. The mitral valve is normal in structure. No evidence of mitral valve regurgitation. No evidence of mitral stenosis.  5. The aortic valve was not well visualized. Aortic valve regurgitation is not visualized. No aortic stenosis is present.  6. The inferior vena cava is normal in size with greater than 50% respiratory variability, suggesting right atrial pressure of 3 mmHg. Comparison(s): No prior Echocardiogram. FINDINGS  Left Ventricle: Left ventricular ejection fraction, by estimation, is 50 to 55%. The left ventricle has low normal function. The left ventricle demonstrates regional wall motion abnormalities. Definity contrast agent was given IV to delineate the left ventricular endocardial borders. The left ventricular internal cavity size was normal in size. There is no left ventricular hypertrophy. Left ventricular diastolic parameters are consistent with Grade II diastolic dysfunction (pseudonormalization). Elevated left atrial pressure. Right Ventricle: The right ventricular size is normal. Right ventricular systolic function is normal. Left Atrium: Left atrial size was normal in size. Right Atrium: Right atrial size was normal in size. Pericardium: There is no evidence of pericardial effusion. Mitral Valve: The mitral valve is normal in structure. No evidence of mitral valve regurgitation. No evidence of mitral valve stenosis. Tricuspid Valve: The tricuspid valve is normal in structure. Tricuspid valve regurgitation is trivial. No evidence of tricuspid stenosis. Aortic Valve: The aortic valve was not well visualized. Aortic valve regurgitation is not visualized. No aortic stenosis is present. Aortic valve mean gradient measures 2.0 mmHg. Aortic valve peak gradient measures 2.8 mmHg. Aortic  valve area, by VTI measures 1.70 cm. Pulmonic Valve: The pulmonic valve was not well visualized. Pulmonic valve regurgitation is not visualized. No evidence of pulmonic stenosis. Aorta: The aortic root is normal in size and structure. Venous: The inferior vena cava is normal in size with  greater than 50% respiratory variability, suggesting right atrial pressure of 3 mmHg. IAS/Shunts: No atrial level shunt detected by color flow Doppler. Additional Comments: Akinesis of the distal septum/apex; overall preserved LV function.  LEFT VENTRICLE PLAX 2D LVIDd:         3.90 cm   Diastology LVIDs:         3.50 cm   LV e' medial:    4.93 cm/s LV PW:         0.80 cm   LV E/e' medial:  15.0 LV IVS:        0.80 cm   LV e' lateral:   5.75 cm/s LVOT diam:     1.60 cm   LV E/e' lateral: 12.9 LV SV:         20 LV SV Index:   11 LVOT Area:     2.01 cm  RIGHT VENTRICLE RV S prime:     7.53 cm/s TAPSE (M-mode): 1.8 cm LEFT ATRIUM           Index        RIGHT ATRIUM           Index LA diam:      3.80 cm 2.08 cm/m   RA Area:     13.45 cm LA Vol (A2C): 47.6 ml 26.09 ml/m  RA Volume:   32.15 ml  17.62 ml/m LA Vol (A4C): 31.9 ml 17.48 ml/m  AORTIC VALVE AV Area (Vmax):    1.62 cm AV Area (Vmean):   1.66 cm AV Area (VTI):     1.70 cm AV Vmax:           83.20 cm/s AV Vmean:          59.700 cm/s AV VTI:            0.118 m AV Peak Grad:      2.8 mmHg AV Mean Grad:      2.0 mmHg LVOT Vmax:         67.10 cm/s LVOT Vmean:        49.400 cm/s LVOT VTI:          0.100 m LVOT/AV VTI ratio: 0.85  AORTA Ao Root diam: 2.80 cm MITRAL VALVE               TRICUSPID VALVE MV Area (PHT): 5.50 cm    TR Peak grad:   19.5 mmHg MV Decel Time: 138 msec    TR Vmax:        221.00 cm/s MV E velocity: 74.10 cm/s MV A velocity: 54.80 cm/s  SHUNTS MV E/A ratio:  1.35        Systemic VTI:  0.10 m                            Systemic Diam: 1.60 cm Kirk Ruths MD Electronically signed by Kirk Ruths MD Signature Date/Time: 12/26/2020/4:51:00 PM    Final      Medications: Scheduled:  aspirin EC  81 mg Oral Daily   donepezil  10 mg Oral Daily   enoxaparin (LOVENOX) injection  40 mg Subcutaneous Q24H   insulin aspart  0-15 Units Subcutaneous Q4H   metoprolol tartrate  25 mg Oral Q6H   rosuvastatin  5 mg Oral Daily   sertraline  25 mg Oral Daily   Continuous:  lacosamide (VIMPAT) IV Stopped (12/26/20 2306)    Assessment: 87 y.o. male with a PMHx of dementia who  presented with acute onset of agitation, confusion and dysphasia. Initially found by wife at approximately 8:50 AM on Tuesday in bed grunting, moaning and flailing around, not following instructions, with no coordinated movement. EMS was called and on arrival he was in sinus tachycardia at a rate of 150, no diaphoresis, pallor or cyanosis. EMS also noted awake agitated state with garbled speech and inability to follow commands, but with no jerking or twitching seen at that time. Patient then became somnolent, followed by awakening in the ED. At baseline he walks on his own, is interactive and speaks fluently with wife. CTA of head and neck showed multifocal atherosclerotic narrowing/stenosis and occlusion of the distal extracranial vertebral artery, most likely chronic. He had a witnessed seizure by staff on 11/29 that lasted approximately 20 minutes and was started on anticonvulsant therapy.  1. Exam today reveals he is lethargic, arouses to voice and continued stimuli, however drifts back to sleep during exam. He is able to state his name and identify his wife by name. No focal deficits noted. Can follow simple commands. Moves all extremities equally 2. EEG 11/29: Continuous slow, generalized and maximal left frontotemporal region. This study is suggestive of nonspecific cortical dysfunction in the frontotemporal region.  Additionally there is moderate diffuse encephalopathy, nonspecific etiology. No seizures or definite epileptiform discharges were seen throughout the recording.  3. MRI brain  is without acute intracranial abnormality 4. DDx: First time seizure is the most likely etiology for the patient's presentation. Underlying dementia may be contributing to his AMS.  5. Keppra was switched to Vimpat due to agitation yesterday (Thursday)   Recommendations: - Continue Aricept - Avoid sedating medications as much as possible - Continue Vimpat 100 mg BID (less desirable to initiate Depakote or Dilantin therapy given AST elevation) - Continue seizure precautions  - 2 mg IV Ativan PRN any seizure activity lasting > 5 minutes and notify neurology  - Outpatient Neurology follow up  Neurohospitalist service will sign off, please call for questions or concerns   LOS: 2 days   @Electronically  signed: Dr. Kerney Elbe 12/27/2020  8:07 AM

## 2020-12-27 NOTE — Care Management Important Message (Signed)
Important Message  Patient Details  Name: Ralph West MRN: [id number] Date of Birth: 12-16-1932   Medicare Important Message Given:  Yes     Hannah Beat 12/27/2020, 3:37 PM

## 2020-12-28 LAB — CBC WITH DIFFERENTIAL/PLATELET
Abs Immature Granulocytes: 0.04 10*3/uL (ref 0.00–0.07)
Basophils Absolute: 0 10*3/uL (ref 0.0–0.1)
Basophils Relative: 0 %
Eosinophils Absolute: 0.3 10*3/uL (ref 0.0–0.5)
Eosinophils Relative: 3 %
HCT: 38 % — ABNORMAL LOW (ref 39.0–52.0)
Hemoglobin: 12.5 g/dL — ABNORMAL LOW (ref 13.0–17.0)
Immature Granulocytes: 0 %
Lymphocytes Relative: 19 %
Lymphs Abs: 1.8 10*3/uL (ref 0.7–4.0)
MCH: 32.1 pg (ref 26.0–34.0)
MCHC: 32.9 g/dL (ref 30.0–36.0)
MCV: 97.7 fL (ref 80.0–100.0)
Monocytes Absolute: 0.9 10*3/uL (ref 0.1–1.0)
Monocytes Relative: 9 %
Neutro Abs: 6.8 10*3/uL (ref 1.7–7.7)
Neutrophils Relative %: 69 %
Platelets: 146 10*3/uL — ABNORMAL LOW (ref 150–400)
RBC: 3.89 MIL/uL — ABNORMAL LOW (ref 4.22–5.81)
RDW: 12.6 % (ref 11.5–15.5)
WBC: 9.9 10*3/uL (ref 4.0–10.5)
nRBC: 0 % (ref 0.0–0.2)

## 2020-12-28 LAB — GLUCOSE, CAPILLARY
Glucose-Capillary: 108 mg/dL — ABNORMAL HIGH (ref 70–99)
Glucose-Capillary: 114 mg/dL — ABNORMAL HIGH (ref 70–99)
Glucose-Capillary: 120 mg/dL — ABNORMAL HIGH (ref 70–99)
Glucose-Capillary: 72 mg/dL (ref 70–99)
Glucose-Capillary: 96 mg/dL (ref 70–99)
Glucose-Capillary: 99 mg/dL (ref 70–99)

## 2020-12-28 LAB — COMPREHENSIVE METABOLIC PANEL
ALT: 40 U/L (ref 0–44)
AST: 73 U/L — ABNORMAL HIGH (ref 15–41)
Albumin: 2.8 g/dL — ABNORMAL LOW (ref 3.5–5.0)
Alkaline Phosphatase: 57 U/L (ref 38–126)
Anion gap: 8 (ref 5–15)
BUN: 19 mg/dL (ref 8–23)
CO2: 26 mmol/L (ref 22–32)
Calcium: 8.3 mg/dL — ABNORMAL LOW (ref 8.9–10.3)
Chloride: 104 mmol/L (ref 98–111)
Creatinine, Ser: 1.05 mg/dL (ref 0.61–1.24)
GFR, Estimated: >60 mL/min (ref >60)
Glucose, Bld: 90 mg/dL (ref 70–99)
Potassium: 3.6 mmol/L (ref 3.5–5.1)
Sodium: 138 mmol/L (ref 135–145)
Total Bilirubin: 1.8 mg/dL — ABNORMAL HIGH (ref 0.3–1.2)
Total Protein: 5.3 g/dL — ABNORMAL LOW (ref 6.5–8.1)

## 2020-12-28 MED ORDER — LACTATED RINGERS IV BOLUS
1000.0000 mL | Freq: Once | INTRAVENOUS | Status: AC
Start: 2020-12-28 — End: 2020-12-28
  Administered 2020-12-28: 1000 mL via INTRAVENOUS

## 2020-12-28 NOTE — Progress Notes (Addendum)
HD#3 SUBJECTIVE:  Patient Summary: Ralph West is a 87 y.o. with a pertinent PMH of diabetes hyperlipidemia hypertension Alzheimer's MI, who presented with seizure-like activity and admitted for acute metabolic encephalopathy.   Overnight Events: Episodes of tachycardia up to 119 overnight.  Blood pressure has remained stable    Interm History: Patient seen and evaluated bedside this morning.  He is much more alert and responsive than previous exam.  He follows commands and answers simple questions.  OBJECTIVE:  Vital Signs: Vitals:   12/27/20 2000 12/28/20 0000 12/28/20 0245 12/28/20 0335  BP: 137/82 112/82 121/78 118/68  Pulse: (!) 106 87 (!) 119 82  Resp: (!) 26 17  18   Temp: 98.1 F (36.7 C) 98.1 F (36.7 C)  98 F (36.7 C)  TempSrc: Oral Oral  Oral  SpO2:  98%  97%  Weight:      Height:       Supplemental O2: Room Air SpO2: 97 % O2 Flow Rate (L/min): 2 L/min  Filed Weights   12/24/20 0900  Weight: 75 kg     Intake/Output Summary (Last 24 hours) at 12/28/2020 0601 Last data filed at 12/28/2020 0045 Gross per 24 hour  Intake 200 ml  Output 600 ml  Net -400 ml   Net IO Since Admission: 513.13 mL [12/28/20 0601]  Physical Exam:  Physical Exam Constitutional:      General: He is not in acute distress.    Appearance: He is normal weight. He is not ill-appearing, toxic-appearing or diaphoretic.  HENT:     Head: Atraumatic.     Mouth/Throat:     Mouth: Mucous membranes are dry.  Eyes:     Pupils: Pupils are equal, round, and reactive to light.  Cardiovascular:     Rate and Rhythm: Normal rate and regular rhythm.     Heart sounds: Normal heart sounds.  Pulmonary:     Effort: No respiratory distress.     Breath sounds: Normal breath sounds. No wheezing, rhonchi or rales.     Comments: Patient does not appear to be in respiratory distress, however he is taking shallow breaths with occasional apneic spells.  Saturating 94% 2 L Chest:     Chest wall: No  tenderness.  Abdominal:     General: Abdomen is flat. Bowel sounds are normal. There is no distension.     Palpations: Abdomen is soft. There is no mass.     Tenderness: There is no abdominal tenderness.  Musculoskeletal:        General: Normal range of motion.     Cervical back: Normal range of motion and neck supple.  Skin:    General: Skin is warm and dry.  Neurological:     General: No focal deficit present.     Mental Status: He is alert. He is disoriented.     Comments: Patient is much more alert than prior exam.  He remains confused and is disoriented.  He is unable to state name, year, or location.  He does follow simple commands and questions.  Participates actively in neuro exam.  No focal neurodeficits.  Follows finger appropriately with eyes, no nystagmus.  No facial droop noted, tongue midline, no slurring of speech.  Grip strength 5 5.  Able to lift legs off bed against gravity.  Sensation intact.  Psychiatric:     Comments: Patient is much more alert this morning, does not appear to be responding to internal stimuli, however he remains disoriented and confused.  He is calm and cooperative.    Patient Lines/Drains/Airways Status     Active Line/Drains/Airways     Name Placement date Placement time Site Days   Peripheral IV 12/24/20 18 G Anterior;Distal;Left Forearm 12/24/20  1033  Forearm  2   External Urinary Catheter 12/25/20  1200  --  1            Pertinent Labs: CBC Latest Ref Rng & Units 12/28/2020 12/27/2020 12/26/2020  WBC 4.0 - 10.5 K/uL 9.9 11.6(H) 10.1  Hemoglobin 13.0 - 17.0 g/dL 12.5(L) 13.1 13.9  Hematocrit 39.0 - 52.0 % 38.0(L) 39.4 41.7  Platelets 150 - 400 K/uL 146(L) 158 167    CMP Latest Ref Rng & Units 12/28/2020 12/27/2020 12/26/2020  Glucose 70 - 99 mg/dL 90 87 139(H)  BUN 8 - 23 mg/dL 19 15 13   Creatinine 0.61 - 1.24 mg/dL 1.05 0.95 1.00  Sodium 135 - 145 mmol/L 138 138 141  Potassium 3.5 - 5.1 mmol/L 3.6 3.8 3.8  Chloride 98 - 111 mmol/L  104 106 107  CO2 22 - 32 mmol/L 26 25 26   Calcium 8.9 - 10.3 mg/dL 8.3(L) 8.4(L) 8.8(L)  Total Protein 6.5 - 8.1 g/dL 5.3(L) - 5.8(L)  Total Bilirubin 0.3 - 1.2 mg/dL 1.8(H) - 2.3(H)  Alkaline Phos 38 - 126 U/L 57 - 72  AST 15 - 41 U/L 73(H) - 112(H)  ALT 0 - 44 U/L 40 - 46(H)    Recent Labs    12/27/20 2002 12/28/20 0010 12/28/20 0408  GLUCAP 140* 72 96      ASSESSMENT/PLAN:   Assessment:  Ralph West is a 87 y.o. with pertinent PMH of Alzheimer's, hypertension, hyperlipidemia who presented with jerking/seizure-like activity and admit for acute encephalopathy on hospital day 3.  Earlier this admission patient was awake and talking, however since that time his mental status has declined, and he became poorly responsive to commands, sleeps throughout most of exam with occasional apneic breathing.  He has had poor oral intake, with occasional tachycardia and episode of hypotension which has improved with IV fluids.  Imaging has revealed possible underlying aspiration pneumonia which could have occurred after witnessed seizure.  He has since been started on Unasyn with improvement in his mental status today.  He remains confused but is moving in the right direction again.  Vital signs have been more stable, no hypotension.  We will need to continue closely monitoring vital signs and mental status.  Plan: #Acute encephalopathy, differential includes postictal as well as infectious. History of seizure -Patient's acute encephalopathy was initially believed to be secondary to postictal state, however since admission to the hospital no seizure activity has been observed and mental state continued to decline.  During evaluation for possible PE opacities were noted on the exam with suspicion for aspiration pneumonia given patient's recent seizure.  Given this finding we began treating him with Unasyn for aspiration pneumonia.  His mental status has since improved, he is much less drowsy today.  He  is alert, calm and cooperative with exam.  He remains confused.  He has not received any sedating medications for the past several days.  Feel that his drowsiness and altered mental status was likely related to underlying infection given clinical improvement since starting antibiotics. -Neurology has evaluated the patient, appreciate their recommendations. -Will continue to avoid sedating medications as much as possible. -Continue Aricept -Continue Vimpat 100 mg twice daily -2 mg IV Ativan as needed for any seizures currently 5 minutes. -Seizure precautions -  Patient will need outpatient neurology follow-up  #Aspiration pneumonia #Sinus tachycardia. Patient initially with mild leukocytosis, altered mental status, and low-grade temperature.  He was denying any cough, during evaluation of sinus tachycardia, noted bilateral lung opacities suspicious for aspiration pneumonia possibly related to recent seizure.  Due to sinus tachycardia he was given fluids, and briefly became hypotensive and was bolused 1 L and given maintenance fluids.  We have since begun treating with Unasyn with dramatic improvement in mental status.  White blood cell count has returned to normal limits.  Vital signs have since remained stable.  Satting high 90s on 2 L nasal cannula currently. -Continue Unasyn -Continue supplemental oxygen -Continue close monitoring of vital signs as well as mental status.  Elevated hepatic enzymes and bilirubin - current bili 1.3, AST 73 - seems to be asymptomatic and improving. - will continue to monitor, if pt devlops abd pain or N/V, can consider RUQ [place] for cholelithiasis. Alk phos normal at this time.  #Spiculated opacity left upper lobe Remote history of smoking Seen on CT angio chest This can likely be worked up as an outpatient with Noncon CT in 3 months.  #Alzheimer's dementia Continue donepezil. Physical therapy evaluated patient recommending short-term skilled nursing  rehab.  Hyperlipidemia continue statin  Hypertension Continue metoprolol.  Best Practice: IVF: Fluids: LR,  VTE: enoxaparin (LOVENOX) injection 40 mg Start: 12/26/20 1445 Code: Full Family Contact: wife, at bedside. Anticipate discharge approximately 1 to 2 days  Signature: Delene Ruffini, MD  Internal Medicine Resident, PGY-1 [facility]  Pager: [phone] 6:01 AM, 12/28/2020   Please contact the on call pager after 5 pm and on weekends at [phone].

## 2020-12-28 NOTE — Progress Notes (Signed)
Evaluated patient at bedside. His heart rate was elevated in the 120s. He denied any symptoms of shortness of breath, chest pain or any other pain or anxiety. On physical exam cardiovascular exam showed tachycardia on auscultation, with no JVD or LE edema. Her mucous membrane appeared dry. Speaking with the nursing staff, they noted decreased PO intake today. He received 250 cc of fluids with his antibiotics and AED. His last echo and kidney function was checked with EF of LVEF of 55% in 12/2020 and normal creatinine. 1 liter bolus was given. Will monitor his tachycardia and fluid status after this.   Idamae Schuller, MD Tillie Rung. [facility] Internal Medicine Residency, PGY-1

## 2020-12-29 ENCOUNTER — Other Ambulatory Visit: Payer: Self-pay

## 2020-12-29 ENCOUNTER — Encounter (HOSPITAL_COMMUNITY): Payer: Self-pay | Admitting: Internal Medicine

## 2020-12-29 LAB — GLUCOSE, CAPILLARY
Glucose-Capillary: 114 mg/dL — ABNORMAL HIGH (ref 70–99)
Glucose-Capillary: 131 mg/dL — ABNORMAL HIGH (ref 70–99)
Glucose-Capillary: 134 mg/dL — ABNORMAL HIGH (ref 70–99)
Glucose-Capillary: 153 mg/dL — ABNORMAL HIGH (ref 70–99)
Glucose-Capillary: 93 mg/dL (ref 70–99)
Glucose-Capillary: 94 mg/dL (ref 70–99)

## 2020-12-29 LAB — CBC WITH DIFFERENTIAL/PLATELET
Abs Immature Granulocytes: 0.03 10*3/uL (ref 0.00–0.07)
Basophils Absolute: 0 10*3/uL (ref 0.0–0.1)
Basophils Relative: 0 %
Eosinophils Absolute: 0.3 10*3/uL (ref 0.0–0.5)
Eosinophils Relative: 4 %
HCT: 38.1 % — ABNORMAL LOW (ref 39.0–52.0)
Hemoglobin: 13 g/dL (ref 13.0–17.0)
Immature Granulocytes: 0 %
Lymphocytes Relative: 18 %
Lymphs Abs: 1.7 10*3/uL (ref 0.7–4.0)
MCH: 32.9 pg (ref 26.0–34.0)
MCHC: 34.1 g/dL (ref 30.0–36.0)
MCV: 96.5 fL (ref 80.0–100.0)
Monocytes Absolute: 0.8 10*3/uL (ref 0.1–1.0)
Monocytes Relative: 9 %
Neutro Abs: 6.3 10*3/uL (ref 1.7–7.7)
Neutrophils Relative %: 69 %
Platelets: 156 10*3/uL (ref 150–400)
RBC: 3.95 MIL/uL — ABNORMAL LOW (ref 4.22–5.81)
RDW: 12.3 % (ref 11.5–15.5)
WBC: 9.2 10*3/uL (ref 4.0–10.5)
nRBC: 0 % (ref 0.0–0.2)

## 2020-12-29 LAB — BASIC METABOLIC PANEL
Anion gap: 10 (ref 5–15)
BUN: 13 mg/dL (ref 8–23)
CO2: 24 mmol/L (ref 22–32)
Calcium: 8.4 mg/dL — ABNORMAL LOW (ref 8.9–10.3)
Chloride: 106 mmol/L (ref 98–111)
Creatinine, Ser: 0.95 mg/dL (ref 0.61–1.24)
GFR, Estimated: >60 mL/min (ref >60)
Glucose, Bld: 107 mg/dL — ABNORMAL HIGH (ref 70–99)
Potassium: 3.6 mmol/L (ref 3.5–5.1)
Sodium: 140 mmol/L (ref 135–145)

## 2020-12-29 LAB — MAGNESIUM
Magnesium: 1.6 mg/dL — ABNORMAL LOW (ref 1.7–2.4)
Magnesium: 2.1 mg/dL (ref 1.7–2.4)

## 2020-12-29 LAB — PHOSPHORUS: Phosphorus: 2.5 mg/dL (ref 2.5–4.6)

## 2020-12-29 MED ORDER — METOPROLOL TARTRATE 25 MG PO TABS
25.0000 mg | ORAL_TABLET | Freq: Once | ORAL | Status: AC
  Administered 2020-12-29: 04:00:00 25 mg via ORAL
  Filled 2020-12-29: qty 1

## 2020-12-29 MED ORDER — POTASSIUM CHLORIDE 10 MEQ/100ML IV SOLN: 10.0000 meq | INTRAVENOUS | Status: DC

## 2020-12-29 MED ORDER — LACTATED RINGERS IV SOLN: INTRAVENOUS | Status: DC

## 2020-12-29 MED ORDER — MAGNESIUM SULFATE 2 GM/50ML IV SOLN
2.0000 g | Freq: Once | INTRAVENOUS | Status: AC
Start: 2020-12-29 — End: 2020-12-29
  Administered 2020-12-29: 06:00:00 2 g via INTRAVENOUS
  Filled 2020-12-29: qty 50

## 2020-12-29 MED ORDER — LACTATED RINGERS IV BOLUS
1000.0000 mL | Freq: Once | INTRAVENOUS | Status: AC
  Administered 2020-12-29: 14:00:00 1000 mL via INTRAVENOUS

## 2020-12-29 MED ORDER — ALBUTEROL SULFATE (2.5 MG/3ML) 0.083% IN NEBU
3.0000 mL | INHALATION_SOLUTION | Freq: Four times a day (QID) | RESPIRATORY_TRACT | Status: DC | PRN
  Administered 2020-12-29 – 2020-12-31 (×3): 3 mL via RESPIRATORY_TRACT
  Filled 2020-12-29 (×3): qty 3

## 2020-12-29 MED ORDER — POTASSIUM CHLORIDE CRYS ER 20 MEQ PO TBCR
40.0000 meq | EXTENDED_RELEASE_TABLET | Freq: Once | ORAL | Status: DC
Start: 2020-12-29 — End: 2020-12-29

## 2020-12-29 MED ORDER — POTASSIUM CHLORIDE CRYS ER 20 MEQ PO TBCR
40.0000 meq | EXTENDED_RELEASE_TABLET | Freq: Once | ORAL | Status: DC
  Administered 2020-12-29: 05:00:00 40 meq via ORAL
  Filled 2020-12-29: qty 2

## 2020-12-29 NOTE — Progress Notes (Addendum)
HD#4 SUBJECTIVE:  Patient Summary: Ralph West is a 87 y.o. with a pertinent PMH of diabetes hyperlipidemia hypertension Alzheimer's MI, who presented with seizure-like activity and admitted for acute metabolic encephalopathy.   Overnight Events: Overnight patient was noted to have heart rate elevated to 120.  No symptoms of shortness of breath, chest pain or anxiety.  No JVD or lower extremity edema were noted during that time however he did appear dry.  He was given 1 L bolus    Interm History: Patient seen and evaluated bedside this morning, during which time he appeared somnolent/drowsy.  Wife endorsed that he has been having fluctuating mental status.  Later paged by nurse for wheezing.  During evaluation at that time patient was awake alert answering questions appropriately and eating lunch.  OBJECTIVE:  Vital Signs: Vitals:   12/29/20 1311 12/29/20 1453 12/29/20 1601 12/29/20 1651  BP: 109/66  (!) 156/95 (!) 153/85  Pulse: 80  (!) 115 (!) 130  Resp:   (!) 34   Temp:   97.7 F (36.5 C) 98.1 F (36.7 C)  TempSrc:   Oral Oral  SpO2:  99% 97% 100%  Weight:      Height:       Supplemental O2: Room Air SpO2: 100 % O2 Flow Rate (L/min): 2 L/min  Filed Weights   12/24/20 0900  Weight: 75 kg     Intake/Output Summary (Last 24 hours) at 12/29/2020 1656 Last data filed at 12/29/2020 0829 Gross per 24 hour  Intake 2075 ml  Output 650 ml  Net 1425 ml   Net IO Since Admission: 1,688.13 mL [12/29/20 1656]  Physical Exam:  Physical Exam Constitutional:      General: He is not in acute distress.    Appearance: He is normal weight. He is not ill-appearing, toxic-appearing or diaphoretic.  HENT:     Head: Atraumatic.     Mouth/Throat:     Mouth: Mucous membranes are dry.  Eyes:     Pupils: Pupils are equal, round, and reactive to light.  Cardiovascular:     Rate and Rhythm: Normal rate and regular rhythm.     Heart sounds: Normal heart sounds.  Pulmonary:      Effort: No respiratory distress.     Breath sounds: Normal breath sounds. No wheezing, rhonchi or rales.     Comments: Patient does not appear to be in respiratory distress, however he is taking shallow breaths with occasional apneic spells.  Saturating 94% 2 L Chest:     Chest wall: No tenderness.  Abdominal:     General: Abdomen is flat. Bowel sounds are normal. There is no distension.     Palpations: Abdomen is soft. There is no mass.     Tenderness: There is no abdominal tenderness.  Musculoskeletal:        General: Normal range of motion.     Cervical back: Normal range of motion and neck supple.  Skin:    General: Skin is warm and dry.  Neurological:     General: No focal deficit present.     Mental Status: He is alert. He is disoriented.     Comments: Fluctuating mental status.  Has appeared drowsy/somnolent at times, and upon later evaluation will appear alert.  He responds to questions appropriately and has no focal neurologic deficits, however remains confused.  Is able to state his name but not place or time.  Psychiatric:     Comments: Patient is much more alert this  morning, does not appear to be responding to internal stimuli, however he remains disoriented and confused.  He is calm and cooperative.    Patient Lines/Drains/Airways Status     Active Line/Drains/Airways     Name Placement date Placement time Site Days   Peripheral IV 12/24/20 18 G Anterior;Distal;Left Forearm 12/24/20  1033  Forearm  2   External Urinary Catheter 12/25/20  1200  --  1            Pertinent Labs: CBC Latest Ref Rng & Units 12/29/2020 12/28/2020 12/27/2020  WBC 4.0 - 10.5 K/uL 9.2 9.9 11.6(H)  Hemoglobin 13.0 - 17.0 g/dL 13.0 12.5(L) 13.1  Hematocrit 39.0 - 52.0 % 38.1(L) 38.0(L) 39.4  Platelets 150 - 400 K/uL 156 146(L) 158    CMP Latest Ref Rng & Units 12/29/2020 12/28/2020 12/27/2020  Glucose 70 - 99 mg/dL 107(H) 90 87  BUN 8 - 23 mg/dL 13 19 15   Creatinine 0.61 - 1.24 mg/dL 0.95  1.05 0.95  Sodium 135 - 145 mmol/L 140 138 138  Potassium 3.5 - 5.1 mmol/L 3.6 3.6 3.8  Chloride 98 - 111 mmol/L 106 104 106  CO2 22 - 32 mmol/L 24 26 25   Calcium 8.9 - 10.3 mg/dL 8.4(L) 8.3(L) 8.4(L)  Total Protein 6.5 - 8.1 g/dL - 5.3(L) -  Total Bilirubin 0.3 - 1.2 mg/dL - 1.8(H) -  Alkaline Phos 38 - 126 U/L - 57 -  AST 15 - 41 U/L - 73(H) -  ALT 0 - 44 U/L - 40 -    Recent Labs    12/29/20 0826 12/29/20 1203 12/29/20 1607  GLUCAP 114* 153* 131*      ASSESSMENT/PLAN:   Assessment:  Ralph West is a 87 y.o. with pertinent PMH of Alzheimer's, hypertension, hyperlipidemia who presented with jerking/seizure-like activity and admit for acute encephalopathy on hospital day 4.  Earlier this admission patient was awake and talking, however since that time his mental status has declined, and he became poorly responsive to commands, sleeps throughout most of exam with occasional apneic breathing.  He has had poor oral intake, with occasional tachycardia and episode of hypotension which has improved with IV fluids.  Imaging has revealed possible underlying aspiration pneumonia which could have occurred after witnessed seizure.  He has since been started on Unasyn.  Mental status has continued to fluctuate raising concern for hospital delirium.  Plan: #Acute encephalopathy, differential includes postictal as well as infectious vs hospital delirium. Initial concern for encephalopathy relating to postictal state, however due to persistence of encephalopathy, patient was evaluated for infectious causes. Due to presence of reported cough as well as WBC 11 and temp 99, and possible aspiration noted on CTA, patient was started on unasyn. Improvement in mental status was noted intitally, however, during subsequent evaluation, patient was somnolent. Due to fluctuating mental status with episodes of somnolence and agitation/confusion, feel there is likely a component of hospital delirium as well.  Currently treating possible infectious etiology as well. - with concern for hospital delirium, will d/c restraints. If agitated, can give small dose haldol. Will need to avoid benzos.  -Will continue to avoid sedating medications as much as possible. -Continue Aricept -Continue Vimpat 100 mg twice daily -2 mg IV Ativan as needed for any seizures currently 5 minutes. -Seizure precautions -Patient will need outpatient neurology follow-up  #Aspiration pneumonia #tachycardia. Patient initially with mild leukocytosis, altered mental status, and low-grade temperature.  He was denying any cough, during evaluation of sinus tachycardia, noted bilateral  lung opacities suspicious for aspiration pneumonia possibly related to recent seizure.  Due to sinus tachycardia he was given fluids, and briefly became hypotensive and was bolused 1 L and given maintenance fluids.  We have since begun treating with Unasyn with dramatic improvement in mental status.  White blood cell count has returned to normal limits.  Vital signs have since remained stable.  Satting high 90s on 2 L nasal cannula currently. -Continue Unasyn likely 5 day course.  -Continue supplemental oxygen -Continue close monitoring of vital signs as well as mental status.  Tachycardia - patient is clinically dehydrated, however, he continues to have episodes of tachycardia despite IVFs. Episodes are paroxysmal in nature, not related to anxiety nor exertion. Have been treating possible underlying infection which was initially thought to be driving tachycardia, and have been giving fluids due to clinical dehydration. Patient is not consistently tachycardic, appearing paroxysmal in nature raising suspicion for atrial tachycardia. Narrow complex.   Elevated hepatic enzymes and bilirubin - current bili 1.3, AST 73 - seems to be asymptomatic and improving. - will continue to monitor, if pt devlops abd pain or N/V, can consider RUQ [place] for cholelithiasis.  Alk phos normal at this time.  #Spiculated opacity left upper lobe Remote history of smoking Seen on CT angio chest This can likely be worked up as an outpatient with Noncon CT in 3 months.  #Alzheimer's dementia Continue donepezil. Physical therapy evaluated patient recommending short-term skilled nursing rehab.  Hyperlipidemia continue statin  Hypertension Continue metoprolol.  Best Practice: IVF: Fluids: LR,  VTE: enoxaparin (LOVENOX) injection 40 mg Start: 12/26/20 1445 Code: Full Family Contact: wife, at bedside. Anticipate discharge approximately 1 to 2 days  Signature: Delene Ruffini, MD  Internal Medicine Resident, PGY-1 [facility]  Pager: [phone] 4:56 PM, 12/29/2020   Please contact the on call pager after 5 pm and on weekends at [phone].

## 2020-12-30 DIAGNOSIS — G9341 Metabolic encephalopathy: Secondary | ICD-10-CM | POA: Diagnosis not present

## 2020-12-30 LAB — CBC WITH DIFFERENTIAL/PLATELET
Abs Immature Granulocytes: 0.03 10*3/uL (ref 0.00–0.07)
Basophils Absolute: 0 10*3/uL (ref 0.0–0.1)
Basophils Relative: 0 %
Eosinophils Absolute: 0.4 10*3/uL (ref 0.0–0.5)
Eosinophils Relative: 5 %
HCT: 36.9 % — ABNORMAL LOW (ref 39.0–52.0)
Hemoglobin: 12.3 g/dL — ABNORMAL LOW (ref 13.0–17.0)
Immature Granulocytes: 0 %
Lymphocytes Relative: 24 %
Lymphs Abs: 2 10*3/uL (ref 0.7–4.0)
MCH: 32.4 pg (ref 26.0–34.0)
MCHC: 33.3 g/dL (ref 30.0–36.0)
MCV: 97.1 fL (ref 80.0–100.0)
Monocytes Absolute: 0.7 10*3/uL (ref 0.1–1.0)
Monocytes Relative: 8 %
Neutro Abs: 5.2 10*3/uL (ref 1.7–7.7)
Neutrophils Relative %: 63 %
Platelets: 166 10*3/uL (ref 150–400)
RBC: 3.8 MIL/uL — ABNORMAL LOW (ref 4.22–5.81)
RDW: 12.4 % (ref 11.5–15.5)
WBC: 8.3 10*3/uL (ref 4.0–10.5)
nRBC: 0 % (ref 0.0–0.2)

## 2020-12-30 LAB — GLUCOSE, CAPILLARY
Glucose-Capillary: 109 mg/dL — ABNORMAL HIGH (ref 70–99)
Glucose-Capillary: 110 mg/dL — ABNORMAL HIGH (ref 70–99)
Glucose-Capillary: 111 mg/dL — ABNORMAL HIGH (ref 70–99)
Glucose-Capillary: 88 mg/dL (ref 70–99)
Glucose-Capillary: 93 mg/dL (ref 70–99)
Glucose-Capillary: 95 mg/dL (ref 70–99)
Glucose-Capillary: 99 mg/dL (ref 70–99)

## 2020-12-30 LAB — COMPREHENSIVE METABOLIC PANEL
ALT: 32 U/L (ref 0–44)
AST: 41 U/L (ref 15–41)
Albumin: 2.6 g/dL — ABNORMAL LOW (ref 3.5–5.0)
Alkaline Phosphatase: 57 U/L (ref 38–126)
Anion gap: 5 (ref 5–15)
BUN: 9 mg/dL (ref 8–23)
CO2: 26 mmol/L (ref 22–32)
Calcium: 8.2 mg/dL — ABNORMAL LOW (ref 8.9–10.3)
Chloride: 107 mmol/L (ref 98–111)
Creatinine, Ser: 0.96 mg/dL (ref 0.61–1.24)
GFR, Estimated: >60 mL/min (ref >60)
Glucose, Bld: 97 mg/dL (ref 70–99)
Potassium: 3.6 mmol/L (ref 3.5–5.1)
Sodium: 138 mmol/L (ref 135–145)
Total Bilirubin: 1.2 mg/dL (ref 0.3–1.2)
Total Protein: 5.4 g/dL — ABNORMAL LOW (ref 6.5–8.1)

## 2020-12-30 LAB — MAGNESIUM: Magnesium: 1.9 mg/dL (ref 1.7–2.4)

## 2020-12-30 LAB — PHOSPHORUS: Phosphorus: 2.7 mg/dL (ref 2.5–4.6)

## 2020-12-30 NOTE — Progress Notes (Signed)
Physical Therapy Treatment Patient Details Name: Ralph West MRN: [id number] DOB: 1932/02/18 Today's Date: 12/30/2020   History of Present Illness 87 year old male  presented to the ED with cc of aphasia 12/24/20. MRI negative; acute encephalopathy, ?seizure   PMH of anxiety, stable angina, type II diabetes, hyperlipidemia, hypertension, Alzheimer dementia, and myocardial infarction on ASA    PT Comments    Pt able to follow direction well enough and was able to be redirected to task to allow for assist of 1 person though 2 persons would be optimal and safest.  Emphasis on following direction with cues, transitions to EOB, sit to stands, standing balance, progression of gait stability with HHA and then the RW.  Pt became restless, but again was able to be redirected by therapist and nurse to get comfortable back into bed.    Recommendations for follow up therapy are one component of a multi-disciplinary discharge planning process, led by the attending physician.  Recommendations may be updated based on patient status, additional functional criteria and insurance authorization.  Follow Up Recommendations  Acute inpatient rehab (3hours/day) (attempting to get pt back toward PLOF in order to go home with his wife)     Assistance Recommended at Discharge Frequent or constant Supervision/Assistance  Equipment Recommendations  None recommended by PT    Recommendations for Other Services       Precautions / Restrictions Precautions Precautions: Fall Precaution Comments: bil mitts     Mobility  Bed Mobility Overal bed mobility: Needs Assistance Bed Mobility: Supine to Sit;Sit to Supine     Supine to sit: Mod assist Sit to supine: Min assist;+2 for safety/equipment   General bed mobility comments: pt able to follow direction with repetitive VC's and gestures.  truncal and HHA to initiate movement toward EOB and pt with follow through.  Minimal LE assist back into bed     Transfers Overall transfer level: Needs assistance Equipment used: 1 person hand held assist Transfers: Sit to/from Stand Sit to Stand: Min assist           General transfer comment: pt easily brought forward today and steadied until standing upright.    Ambulation/Gait Ambulation/Gait assistance: Min assist;Mod assist Gait Distance (Feet): 80 Feet Assistive device: Rolling walker (2 wheels);1 person hand held assist Gait Pattern/deviations: Step-through pattern;Decreased step length - right;Decreased step length - left;Decreased stride length       General Gait Details: pt initially unsteady with HHA over 5 feet, but with RW and minimal assist pt able to push the RW like a "grocery cart" with enough stability to mobilize with 1 person assist.  2 persons would be optimal when pt loses focus.   Stairs             Wheelchair Mobility    Modified Rankin (Stroke Patients Only)       Balance Overall balance assessment: Needs assistance   Sitting balance-Leahy Scale: Fair       Standing balance-Leahy Scale: Poor Standing balance comment: reliant on external support overall. AD today                            Cognition Arousal/Alertness: Awake/alert Behavior During Therapy: Restless Overall Cognitive Status: No family/caregiver present to determine baseline cognitive functioning  Exercises      General Comments General comments (skin integrity, edema, etc.): Pt pulling O2 tubing and SpO2 sensor off.  Reading on 2L [place] at end of session 97%      Pertinent Vitals/Pain Pain Assessment: Faces Faces Pain Scale: No hurt Pain Intervention(s): Monitored during session    Home Living                          Prior Function            PT Goals (current goals can now be found in the care plan section) Acute Rehab PT Goals Patient Stated Goal: unable to state; wife wants him to  be able to come home but needs to be more independent PT Goal Formulation: With family Time For Goal Achievement: 01/09/21 Potential to Achieve Goals: Good Progress towards PT goals: Progressing toward goals    Frequency    Min 3X/week      PT Plan Current plan remains appropriate    Co-evaluation              AM-PAC PT "6 Clicks" Mobility   Outcome Measure  Help needed turning from your back to your side while in a flat bed without using bedrails?: A Lot Help needed moving from lying on your back to sitting on the side of a flat bed without using bedrails?: A Lot Help needed moving to and from a bed to a chair (including a wheelchair)?: A Lot Help needed standing up from a chair using your arms (e.g., wheelchair or bedside chair)?: A Little Help needed to walk in hospital room?: Total Help needed climbing 3-5 steps with a railing? : Total 6 Click Score: 11    End of Session Equipment Utilized During Treatment: Oxygen Activity Tolerance: Patient tolerated treatment well Patient left: in bed;with call bell/phone within reach;with bed alarm set;Other (comment) (mitts reapplied) Nurse Communication: Mobility status PT Visit Diagnosis: Unsteadiness on feet (R26.81);Other abnormalities of gait and mobility (R26.89);Difficulty in walking, not elsewhere classified (R26.2)     Time: 1537-1600 PT Time Calculation (min) (ACUTE ONLY): 23 min  Charges:  $Gait Training: 8-22 mins $Therapeutic Activity: 8-22 mins                     12/30/2020  Ginger Carne., PT Acute Rehabilitation Services [phone]  (pager) [phone]  (office)   Tessie Fass Lynette Topete 12/30/2020, 4:18 PM

## 2020-12-30 NOTE — [place] FL2 (Signed)
[place] MEDICAID FL2 LEVEL OF CARE SCREENING TOOL     IDENTIFICATION  Patient Name: Ralph West Birthdate: 04-18-1932 Sex: male Admission Date (Current Location): 12/24/2020  [facility] and [place] Number:  Herbalist and Address:  The . [facility], [place] [address] [place], [place] [address]      Provider Number: [id number]  Attending Physician Name and Address:  Axel Filler, *  Relative Name and Phone Number:  Rodolphe Edmonston- wife [phone]    Current Level of Care: Hospital Recommended Level of Care: [place] Prior Approval Number:    Date Approved/Denied:   PASRR Number: [id number]  Discharge Plan: SNF    Current Diagnoses: Patient Active Problem List   Diagnosis Date Noted   Dementia ([place]) 20/94/7096   Acute metabolic encephalopathy 28/36/6294   Anxiety, generalized 07/08/2020   Diabetes mellitus without complication ([place]) 76/54/6503   Hernia, umbilical 54/65/6812   Hyperlipidemia 07/08/2020   Hypertension 07/08/2020   Kidney stones 07/08/2020    Orientation RESPIRATION BLADDER Height & Weight     Self  O2 (2L Nasal cannula) Incontinent, External catheter Weight: 165 lb 5.5 oz (75 kg) Height:  5\' 5"  (165.1 cm)  BEHAVIORAL SYMPTOMS/MOOD NEUROLOGICAL BOWEL NUTRITION STATUS    Convulsions/Seizures, Grand mal (Had one seizure in ED) Incontinent Diet (See DC summary)  AMBULATORY STATUS COMMUNICATION OF NEEDS Skin   Extensive Assist Verbally Other (Comment) (skin tear on arm)                       Personal Care Assistance Level of Assistance  Bathing, Feeding, Dressing Bathing Assistance: Maximum assistance Feeding assistance: Limited assistance Dressing Assistance: Limited assistance Total Care Assistance: Maximum assistance   Functional Limitations Info             SPECIAL CARE FACTORS FREQUENCY  PT (By licensed PT), OT (By licensed OT)     PT Frequency: 5 x a week OT Frequency: 5  times a week            Contractures Contractures Info: Not present    Additional Factors Info  Code Status, Allergies, Psychotropic, Insulin Sliding Scale Code Status Info: Full Allergies Info: NKA Psychotropic Info: Aricept; Zoloft Insulin Sliding Scale Info: see dc summary       Current Medications (12/30/2020):  This is the current hospital active medication list Current Facility-Administered Medications  Medication Dose Route Frequency Provider Last Rate Last Admin   acetaminophen (TYLENOL) tablet 650 mg  650 mg Oral Q6H PRN Lacinda Axon, MD   650 mg at 12/29/20 2036   Or   acetaminophen (TYLENOL) suppository 650 mg  650 mg Rectal Q6H PRN Lacinda Axon, MD       albuterol (PROVENTIL) (2.5 MG/3ML) 0.083% nebulizer solution 3 mL  3 mL Inhalation Q6H PRN Iona Beard, MD   3 mL at 12/29/20 1452   Ampicillin-Sulbactam (UNASYN) 3 g in sodium chloride 0.9 % 100 mL IVPB  3 g Intravenous Q6H Demaio, Alexa, MD 200 mL/hr at 12/30/20 0513 3 g at 12/30/20 0513   aspirin EC tablet 81 mg  81 mg Oral Daily Timothy Lasso, MD   81 mg at 12/29/20 0937   donepezil (ARICEPT) tablet 10 mg  10 mg Oral Daily Lacinda Axon, MD   10 mg at 12/29/20 0937   enoxaparin (LOVENOX) injection 40 mg  40 mg Subcutaneous Q24H Iona Beard, MD   40 mg at 12/29/20 1521   insulin  aspart (novoLOG) injection 0-15 Units  0-15 Units Subcutaneous Q4H Lacinda Axon, MD   2 Units at 12/29/20 2130   lacosamide (VIMPAT) 100 mg in sodium chloride 0.9 % 25 mL IVPB  100 mg Intravenous Q12H Rikki Spearing, NP   Stopped at 12/29/20 2100   metoprolol tartrate (LOPRESSOR) tablet 25 mg  25 mg Oral Q6H Rick Duff, MD   25 mg at 12/30/20 0509   nitroGLYCERIN (NITROSTAT) SL tablet 0.4 mg  0.4 mg Sublingual Q5 min PRN Lacinda Axon, MD       ondansetron [facility]) tablet 4 mg  4 mg Oral Q6H PRN Lacinda Axon, MD       Or   ondansetron [facility]) injection 4 mg  4 mg Intravenous Q6H PRN  Lacinda Axon, MD       rosuvastatin (CRESTOR) tablet 5 mg  5 mg Oral Daily Lacinda Axon, MD   5 mg at 12/29/20 2423   senna-docusate (Senokot-S) tablet 1 tablet  1 tablet Oral QHS PRN Lacinda Axon, MD       sertraline (ZOLOFT) tablet 25 mg  25 mg Oral Daily Lacinda Axon, MD   25 mg at 12/29/20 5361     Discharge Medications: Please see discharge summary for a list of discharge medications.  Relevant Imaging Results:  Relevant Lab Results:   Additional Information SSN# [id number]. Pfizer vaccine 02/09/19, 03/03/19  Benard Halsted, LCSW

## 2020-12-30 NOTE — Progress Notes (Addendum)
HD#5 SUBJECTIVE:  Patient Summary: Ralph West is a 87 y.o. with a pertinent PMH of diabetes hyperlipidemia hypertension Alzheimer's MI, who presented with seizure-like activity and admitted for acute metabolic encephalopathy.   Overnight Events: Pt agitated overnight. Required redirecting. Pulled IV out.     Interm History: Patient seen and evaluated bedside this morning. He states he is doing well. Had not yet eaten breakfast due to mittens in place. No complaints at thtis time. Does not recall being agitated last night. No complaints at this time, states he feels well.  OBJECTIVE:  Vital Signs: Vitals:   12/30/20 0509 12/30/20 0749 12/30/20 1140 12/30/20 1604  BP: (!) 151/79 140/80 (!) 148/92 (!) 160/89  Pulse: 92 85 81 (!) 104  Resp:  (!) 24 (!) 23 20  Temp:  98.3 F (36.8 C) 98.3 F (36.8 C) 98.7 F (37.1 C)  TempSrc:  Oral Oral Oral  SpO2:  97% 99% 99%  Weight:      Height:       Supplemental O2: Room Air SpO2: 99 % O2 Flow Rate (L/min): 2 L/min  Filed Weights   12/24/20 0900  Weight: 75 kg     Intake/Output Summary (Last 24 hours) at 12/30/2020 1829 Last data filed at 12/30/2020 0900 Gross per 24 hour  Intake 1448.44 ml  Output 900 ml  Net 548.44 ml   Net IO Since Admission: 1,836.57 mL [12/30/20 1829]  Physical Exam:  Physical Exam Constitutional:      General: He is not in acute distress.    Appearance: He is normal weight. He is not ill-appearing, toxic-appearing or diaphoretic.  HENT:     Head: Atraumatic.     Mouth/Throat:     Mouth: Mucous membranes are dry.  Eyes:     Pupils: Pupils are equal, round, and reactive to light.  Cardiovascular:     Rate and Rhythm: Normal rate and regular rhythm.     Heart sounds: Normal heart sounds.  Pulmonary:     Effort: No respiratory distress.     Breath sounds: Normal breath sounds. No wheezing, rhonchi or rales.     Comments: Patient does not appear to be in respiratory distress, however he is taking  shallow breaths with occasional apneic spells.  Saturating 94% 2 L Chest:     Chest wall: No tenderness.  Abdominal:     General: Abdomen is flat. Bowel sounds are normal. There is no distension.     Palpations: Abdomen is soft. There is no mass.     Tenderness: There is no abdominal tenderness.  Musculoskeletal:        General: Normal range of motion.     Cervical back: Normal range of motion and neck supple.  Skin:    General: Skin is warm and dry.  Neurological:     General: No focal deficit present.     Mental Status: He is alert. He is disoriented.     Comments: Fluctuating mental status.  Has appeared drowsy/somnolent at times, and upon later evaluation will appear alert.  He responds to questions appropriately and has no focal neurologic deficits, however remains confused.  Is able to state his name but not place or time.  Psychiatric:     Comments: Patient is much more alert this morning, does not appear to be responding to internal stimuli, however he remains disoriented and confused.  He is calm and cooperative.    Patient Lines/Drains/Airways Status     Active Line/Drains/Airways  Name Placement date Placement time Site Days   Peripheral IV 12/24/20 18 G Anterior;Distal;Left Forearm 12/24/20  1033  Forearm  2   External Urinary Catheter 12/25/20  1200  --  1            Pertinent Labs: CBC Latest Ref Rng & Units 12/30/2020 12/29/2020 12/28/2020  WBC 4.0 - 10.5 K/uL 8.3 9.2 9.9  Hemoglobin 13.0 - 17.0 g/dL 12.3(L) 13.0 12.5(L)  Hematocrit 39.0 - 52.0 % 36.9(L) 38.1(L) 38.0(L)  Platelets 150 - 400 K/uL 166 156 146(L)    CMP Latest Ref Rng & Units 12/30/2020 12/29/2020 12/28/2020  Glucose 70 - 99 mg/dL 97 107(H) 90  BUN 8 - 23 mg/dL _0 Creatinine 0.61 - 1.24 mg/dL 0.96 0.95 1.05  Sodium 135 - 145 mmol/L 138 140 138  Potassium 3.5 - 5.1 mmol/L 3.6 3.6 3.6  Chloride 98 - 111 mmol/L 107 106 104  CO2 22 - 32 mmol/L _1 Calcium 8.9 - 10.3 mg/dL 8.2(L)  8.4(L) 8.3(L)  Total Protein 6.5 - 8.1 g/dL 5.4(L) - 5.3(L)  Total Bilirubin 0.3 - 1.2 mg/dL 1.2 - 1.8(H)  Alkaline Phos 38 - 126 U/L 57 - 57  AST 15 - 41 U/L 41 - 73(H)  ALT 0 - 44 U/L 32 - 40    Recent Labs    12/30/20 0751 12/30/20 1139 12/30/20 1608  GLUCAP 95 88 111*      ASSESSMENT/PLAN:   Assessment:  Ralph West is a 87 y.o. with pertinent PMH of Alzheimer's, hypertension, hyperlipidemia who presented with jerking/seizure-like activity and admit for acute encephalopathy on hospital day 5.  Earlier this admission patient was awake and talking, however since that time his mental status has declined, and he became poorly responsive to commands, sleeps throughout most of exam with occasional apneic breathing.  He has had poor oral intake, with occasional tachycardia and episode of hypotension which has improved with IV fluids.  Imaging has revealed possible underlying aspiration pneumonia which could have occurred after witnessed seizure.  He has since been started on Unasyn.  Mental status has continued to fluctuate raising concern for hospital delirium.  Plan: #Acute encephalopathy, differential includes postictal as well as infectious vs hospital delirium. Appears much improved today. Due to fluctuating mental status with episodes of somnolence and agitation/confusion, feel there is likely a component of hospital delirium as well. Currently treating possible infectious etiology as well. - with concern for hospital delirium. If agitated, can give small dose haldol. Will need to avoid benzos.  -Will continue to avoid sedating medications as much as possible. -Continue Aricept -Continue Vimpat 100 mg twice daily -2 mg IV Ativan as needed for any seizures currently 5 minutes. -Seizure precautions - can likely be discharged to SNF tomorrow -Patient will need outpatient neurology follow-up  #Aspiration pneumonia - resolved - initial concern for aspirtyion PNA given CTA  findings and need for supplemental O2, WB count, and tachycardia.  - he has been receiving Unasyn, day 4/5 and appears much improved. Satting 99% on room air, no evvidence of crackles or rhonhchi on exam.  - appears resolved clinically, will complete abx course tomorrow.  Tachycardia - dehydration has resolved with IVFs.  - no tachycardia today. Episodes are paroxysmal in nature, not related to anxiety nor exertion. Have been treating possible underlying infection which was initially thought to be driving tachycardia, and have been giving fluids due to clinical dehydration. Patient is not consistently tachycardic, appearing paroxysmal in nature raising  suspicion for atrial tachycardia. Narrow complex.  - HR has been 80s through most of the day, BP has been stable.  - continue to monitor.   Elevated hepatic enzymes and bilirubin - resolved - will continue to monitor, if pt devlops abd pain or N/V, can consider RUQ [place] for cholelithiasis. Alk phos normal at this time.  #Spiculated opacity left upper lobe Remote history of smoking Seen on CT angio chest This can likely be worked up as an outpatient with Noncon CT in 3 months.  #Alzheimer's dementia Continue donepezil. Physical therapy evaluated patient recommending short-term skilled nursing rehab.  Hyperlipidemia continue statin  Hypertension Continue metoprolol.  Best Practice: IVF: Fluids: LR,  VTE: enoxaparin (LOVENOX) injection 40 mg Start: 12/26/20 1445 Code: Full Family Contact: wife, at bedside. Anticipate discharge approximately 1 to 2 days  Signature: Delene Ruffini, MD  Internal Medicine Resident, PGY-1 [facility]  Pager: [phone] 6:29 PM, 12/30/2020   Please contact the on call pager after 5 pm and on weekends at [phone].

## 2020-12-30 NOTE — Progress Notes (Signed)
   12/30/20 2000  Assess: MEWS Score  Temp 97.8 F (36.6 C)  BP (!) 143/92  Pulse Rate (!) 125  ECG Heart Rate (!) 125  Resp 16  Level of Consciousness Alert  SpO2 100 %  O2 Device Room Air  Assess: MEWS Score  MEWS Temp 0  MEWS Systolic 0  MEWS Pulse 2  MEWS RR 0  MEWS LOC 0  MEWS Score 2  MEWS Score Color Yellow  Assess: if the MEWS score is Yellow or Red  Were vital signs taken at a resting state? No  Focused Assessment No change from prior assessment  Early Detection of Sepsis Score *See Row Information* Low  MEWS guidelines implemented *See Row Information* Yes  Treat  MEWS Interventions Administered scheduled meds/treatments  Pain Scale 0-10  Pain Score 0  Take Vital Signs  Increase Vital Sign Frequency  Yellow: Q 2hr X 2 then Q 4hr X 2, if remains yellow, continue Q 4hrs  Escalate  MEWS: Escalate Yellow: discuss with charge nurse/RN and consider discussing with provider and RRT  Notify: Charge Nurse/RN  Name of Charge Nurse/RN Notified Bard Herbert, RN  Date Charge Nurse/RN Notified 12/30/20  Time Charge Nurse/RN Notified 2100  MEWS Yellow. Patient agitated, attempting to get out of bed. In soft bilateral wrist restraints. HR elevated. Activated Yellows MEWS protocol due to HR. Patient given po Metoprolol. Will continue to follow vital sign trends per protocol and escalate to MD if necessary.

## 2020-12-30 NOTE — TOC Progression Note (Signed)
Transition of Care [facility]) - Progression Note    Patient Details  Name: Ralph West MRN: [id number] Date of Birth: [birth year]  Transition of Care [facility]) CM/SW [place], LCSW Phone Number: 12/30/2020, 9:09 AM  Clinical Narrative:    CSW sent out referral for SNF and asked [facility]  to review.   Expected Discharge Plan: [place] Barriers to Discharge: Continued Medical Work up  Expected Discharge Plan and Services Expected Discharge Plan: [place]   Discharge Planning Services: CM Consult Post Acute Care Choice: [place] Living arrangements for the past 2 months: Single Family Home                                       Social Determinants of Health (SDOH) Interventions    Readmission Risk Interventions No flowsheet data found.

## 2020-12-30 NOTE — Progress Notes (Signed)
  Inpatient Rehab Admissions Coordinator :  Per therapy change in recommendations patient was screened for CIR candidacy by Cedrica Brune RN MSN. Patient does not appear to demonstrate the medical neccesity for a Hospital Rehabilitation /CIR admit. I will not place a Rehab Consult. Recommend other Rehab Venues to be pursued. Please contact me with any questions.  Jayne Peckenpaugh RN MSN Admissions Coordinator [phone]  

## 2020-12-31 DIAGNOSIS — R569 Unspecified convulsions: Secondary | ICD-10-CM

## 2020-12-31 DIAGNOSIS — I471 Supraventricular tachycardia: Secondary | ICD-10-CM | POA: Diagnosis present

## 2020-12-31 DIAGNOSIS — J690 Pneumonitis due to inhalation of food and vomit: Secondary | ICD-10-CM | POA: Diagnosis present

## 2020-12-31 DIAGNOSIS — I4719 Other supraventricular tachycardia: Secondary | ICD-10-CM | POA: Diagnosis present

## 2020-12-31 LAB — BASIC METABOLIC PANEL
Anion gap: 12 (ref 5–15)
BUN: 11 mg/dL (ref 8–23)
CO2: 23 mmol/L (ref 22–32)
Calcium: 8.5 mg/dL — ABNORMAL LOW (ref 8.9–10.3)
Chloride: 106 mmol/L (ref 98–111)
Creatinine, Ser: 1.08 mg/dL (ref 0.61–1.24)
GFR, Estimated: >60 mL/min (ref >60)
Glucose, Bld: 108 mg/dL — ABNORMAL HIGH (ref 70–99)
Potassium: 3.7 mmol/L (ref 3.5–5.1)
Sodium: 141 mmol/L (ref 135–145)

## 2020-12-31 LAB — GLUCOSE, CAPILLARY
Glucose-Capillary: 106 mg/dL — ABNORMAL HIGH (ref 70–99)
Glucose-Capillary: 107 mg/dL — ABNORMAL HIGH (ref 70–99)
Glucose-Capillary: 133 mg/dL — ABNORMAL HIGH (ref 70–99)
Glucose-Capillary: 105 mg/dL — ABNORMAL HIGH (ref 70–99)
Glucose-Capillary: 168 mg/dL — ABNORMAL HIGH (ref 70–99)

## 2020-12-31 LAB — CBC
HCT: 38.6 % — ABNORMAL LOW (ref 39.0–52.0)
Hemoglobin: 12.8 g/dL — ABNORMAL LOW (ref 13.0–17.0)
MCH: 32.2 pg (ref 26.0–34.0)
MCHC: 33.2 g/dL (ref 30.0–36.0)
MCV: 97 fL (ref 80.0–100.0)
Platelets: 183 10*3/uL (ref 150–400)
RBC: 3.98 MIL/uL — ABNORMAL LOW (ref 4.22–5.81)
RDW: 12.2 % (ref 11.5–15.5)
WBC: 9.5 10*3/uL (ref 4.0–10.5)
nRBC: 0 % (ref 0.0–0.2)

## 2020-12-31 MED ORDER — LACTATED RINGERS IV BOLUS
500.0000 mL | Freq: Once | INTRAVENOUS | Status: AC
  Administered 2020-12-31: 500 mL via INTRAVENOUS

## 2020-12-31 MED ORDER — METOPROLOL TARTRATE 5 MG/5ML IV SOLN
10.0000 mg | Freq: Once | INTRAVENOUS | Status: AC
  Administered 2020-12-31: 10 mg via INTRAVENOUS
  Filled 2020-12-31: qty 10

## 2020-12-31 NOTE — TOC Progression Note (Signed)
Transition of Care [facility]) - Progression Note    Patient Details  Name: Ralph West MRN: [id number] Date of Birth: 05/23/1932  Transition of Care [facility]) CM/SW [place], LCSW Phone Number: 12/31/2020, 9:53 AM  Clinical Narrative:    CSW reached out to see if [facility] [place] has made a decision on patient yet; waiting to hear back.   Expected Discharge Plan: [place] Barriers to Discharge: Continued Medical Work up  Expected Discharge Plan and Services Expected Discharge Plan: [place]   Discharge Planning Services: CM Consult Post Acute Care Choice: [place] Living arrangements for the past 2 months: Single Family Home                                       Social Determinants of Health (SDOH) Interventions    Readmission Risk Interventions No flowsheet data found.

## 2020-12-31 NOTE — Progress Notes (Signed)
Speech Language Pathology Treatment: Dysphagia  Patient Details Name: Ralph West MRN: [id number] DOB: 18-Jan-1933 Today's Date: 12/31/2020 Time: 7782-4235 SLP Time Calculation (min) (ACUTE ONLY): 14 min  Assessment / Plan / Recommendation Clinical Impression  Pt sleeping but easily roused; Ralph West at bedside. He was able to feed himself crackers with adequate/thorough mastication; he alternated sips of thin liquids with solid foods with no s/s of aspiration.  His wife reports ongoing sleepiness preventing much PO intake; however, the POs he accepted today did not create issues or concerns for dysphagia.  Recommend advancing diet from chopped/dysphagia 2 to mechanical soft/dysphagia 3. Continue thin liquids and meds whole in puree (as a basic safety precaution).  Ralph West agrees with plan.  Due to ongoing cognitive deficits, Ralph West will needed assist with tray set up and supervision for self-feeding PRN. No further SLP needs are identified - our service will sign off.   HPI HPI: Pt is an 87 year old male who presented to the ED with AMS, seizure-like activity in the ED with persistent tachycardia and worsening respiratory status. MRI brain negative. EEG 11/29: nonspecific cortical dysfunction in the frontotemporal region, moderate diffuse encephalopathy, no seizures or definite epileptiform discharges. PMH: anxiety, stable angina, type II diabetes, hyperlipidemia, hypertension, Alzheimer dementia, and myocardial infarction on ASA.      SLP Plan  All goals met      Recommendations for follow up therapy are one component of a multi-disciplinary discharge planning process, led by the attending physician.  Recommendations may be updated based on patient status, additional functional criteria and insurance authorization.    Recommendations  Diet recommendations: Dysphagia 3 (mechanical soft);Thin liquid Liquids provided via: Cup;Straw Medication Administration: Whole meds with  puree Supervision: Patient able to self feed;Staff to assist with self feeding Compensations: Minimize environmental distractions                Oral Care Recommendations: Oral care BID Follow Up Recommendations: No SLP follow up Assistance recommended at discharge: Frequent or constant Supervision/Assistance SLP Visit Diagnosis: Dysphagia, unspecified (R13.10) Plan: All goals met       GO               Ralph West, [place] Office number [phone] Pager [phone]  Ralph West  12/31/2020, 11:56 AM

## 2020-12-31 NOTE — Progress Notes (Addendum)
HD#6 SUBJECTIVE:  Patient Summary: Ralph West is a 87 y.o. with a pertinent PMH of diabetes hyperlipidemia hypertension Alzheimer's MI, who presented with seizure-like activity and admitted for acute metabolic encephalopathy.   Overnight Events: Agitated overnight, wrist restraints replaced. Tachycardic, received 10mg  metoprolol IVPB    Interm History: Patient seen and evaluated at bedside. Alert, able to answer most questions appropriately. Wife at bedside as well, discussed rehab with her.   OBJECTIVE:  Vital Signs: Vitals:   12/31/20 0401 12/31/20 0530 12/31/20 0747 12/31/20 1154  BP: (!) 169/109 (!) 161/91 (!) 161/93 132/75  Pulse: (!) 123 83 98 81  Resp: (!) 27 15 (!) 24 (!) 23  Temp:   98.5 F (36.9 C) 98.4 F (36.9 C)  TempSrc:   Oral Oral  SpO2: 94% 96% 95% 95%  Weight:      Height:       Supplemental O2: Room Air SpO2: 95 % O2 Flow Rate (L/min): 2 L/min  Filed Weights   12/24/20 0900  Weight: 75 kg     Intake/Output Summary (Last 24 hours) at 12/31/2020 1358 Last data filed at 12/31/2020 0700 Gross per 24 hour  Intake 894.94 ml  Output --  Net 894.94 ml   Net IO Since Admission: 2,971.51 mL [12/31/20 1358]  Physical Exam:  Physical Exam Constitutional:      General: He is not in acute distress.    Appearance: He is normal weight. He is not ill-appearing, toxic-appearing or diaphoretic.  HENT:     Head: Atraumatic.     Mouth/Throat:     Mouth: Mucous membranes are moist.     Pharynx: Oropharynx is clear.  Eyes:     Pupils: Pupils are equal, round, and reactive to light.  Cardiovascular:     Rate and Rhythm: Normal rate and regular rhythm.     Heart sounds: Normal heart sounds.  Pulmonary:     Effort: No respiratory distress.     Breath sounds: Normal breath sounds. No wheezing, rhonchi or rales.     Comments: Patient does not appear to be in respiratory distress, however he is taking shallow breaths with occasional apneic spells.  Saturating  94% 2 L Chest:     Chest wall: No tenderness.  Abdominal:     General: Abdomen is flat. Bowel sounds are normal. There is no distension.     Palpations: Abdomen is soft. There is no mass.     Tenderness: There is no abdominal tenderness.  Musculoskeletal:        General: Normal range of motion.     Cervical back: Normal range of motion and neck supple.  Skin:    General: Skin is warm and dry.  Neurological:     General: No focal deficit present.     Mental Status: He is alert.     Comments: Alert, oriented to self, place Fluctuating mental status.  Has appeared drowsy/somnolent at times, and upon later evaluation will appear alert.  He responds to questions appropriately and has no focal neurologic deficits, however remains confused.  Is able to state his name but not place or time.  Psychiatric:     Comments: Patient is much more alert this morning, does not appear to be responding to internal stimuli. He is calm and cooperative.    Patient Lines/Drains/Airways Status     Active Line/Drains/Airways     Name Placement date Placement time Site Days   Peripheral IV 12/24/20 18 G Anterior;Distal;Left Forearm 12/24/20  1033  Forearm  2   External Urinary Catheter 12/25/20  1200  --  1            Pertinent Labs: CBC Latest Ref Rng & Units 12/31/2020 12/30/2020 12/29/2020  WBC 4.0 - 10.5 K/uL 9.5 8.3 9.2  Hemoglobin 13.0 - 17.0 g/dL 12.8(L) 12.3(L) 13.0  Hematocrit 39.0 - 52.0 % 38.6(L) 36.9(L) 38.1(L)  Platelets 150 - 400 K/uL 183 166 156    CMP Latest Ref Rng & Units 12/31/2020 12/30/2020 12/29/2020  Glucose 70 - 99 mg/dL 108(H) 97 107(H)  BUN 8 - 23 mg/dL 11 9 13   Creatinine 0.61 - 1.24 mg/dL 1.08 0.96 0.95  Sodium 135 - 145 mmol/L 141 138 140  Potassium 3.5 - 5.1 mmol/L 3.7 3.6 3.6  Chloride 98 - 111 mmol/L 106 107 106  CO2 22 - 32 mmol/L 23 26 24   Calcium 8.9 - 10.3 mg/dL 8.5(L) 8.2(L) 8.4(L)  Total Protein 6.5 - 8.1 g/dL - 5.4(L) -  Total Bilirubin 0.3 - 1.2 mg/dL - 1.2  -  Alkaline Phos 38 - 126 U/L - 57 -  AST 15 - 41 U/L - 41 -  ALT 0 - 44 U/L - 32 -    Recent Labs    12/31/20 0436 12/31/20 0749 12/31/20 1155  GLUCAP 106* 107* 133*      ASSESSMENT/PLAN:   Assessment:  [name] is a 87 y.o. with pertinent PMH of Alzheimer's, hypertension, hyperlipidemia admitted for new onset seizures and is on hospital day 6.    Plan:  #Seizure Disorder:  new diagnosis with witnessed seizure activity. Hospital course complicated by prolonged post-ictal state and then hospital delirium. Most likely cause of the seizures is his dementia.  -Vimpat 100 mg twice daily -2 mg IV Ativan as needed for any seizures currently 5 minutes. -Aricept 10mg  daily -Patient will need outpatient neurology follow-up  #Aspiration pneumonia - resolved, afebrile, no wbc elevation, satting well on RA, lung sounds clear - completed 5 day course Unasyn, abx discontinued - will advance diet to mechanical soft/dysphagia 3, Continue thin liquids and meds whole in puree. Due to ongoing cognitive deficits, Mr. Strauch will needed assist with tray set up and supervision for self-feeding PRN.   #Focal Atrial Tachycardia Episodes are paroxysmal, not related to anxiety nor exertion. No infection or dehydration. Narrow complex.  Retrograde p-waves noted on telemetry which is different from regular normal sinus rhythm. - If episode is asymptomatic and hemodynamically stable, it can be observed - continue PO metoprolol 25mg  q6 hours  Elevated hepatic enzymes and bilirubin - resolved - will continue to monitor, if pt devlops abd pain or N/V, can consider RUQ [place] for cholelithiasis.   #Spiculated opacity left upper lobe Remote history of smoking Seen on CT angio chest This can likely be worked up as an outpatient with Noncon CT in 3 months.  Hyperlipidemia  continue statin  Hypertension Continue metoprolol.  Best Practice: IVF: Fluids: LR,  VTE: enoxaparin (LOVENOX) injection 40  mg Start: 12/26/20 1445 Code: Full Family Contact: wife, at bedside. Anticipate discharge approximately 1 to 2 days  Signature: Delene Ruffini, MD  Internal Medicine Resident, PGY-1 [facility]  Pager: [phone] 1:58 PM, 12/31/2020   Please contact the on call pager after 5 pm and on weekends at [phone].

## 2020-12-31 NOTE — Progress Notes (Signed)
Pt's heart rate still 130s, sinus tach, post IV bolus. Messaged provider Dr. Jeanice Lim in regards. Orders received for Metoprolol 10mg  IVP x1.

## 2020-12-31 NOTE — Progress Notes (Signed)
   12/31/20 0401  Assess: MEWS Score  BP (!) 169/109  Pulse Rate (!) 123  ECG Heart Rate (!) 123  Resp (!) 27  SpO2 94 %  Assess: MEWS Score  MEWS Temp 0  MEWS Systolic 0  MEWS Pulse 2  MEWS RR 2  MEWS LOC 0  MEWS Score 4  MEWS Score Color Red  Document  Patient Outcome Not stable and remains on department  Progress note created (see row info) Yes  Messaged provider for any further interventions for patient's unchanged MEWS score. Patient continues to be agitated and confused in his restraints.

## 2020-12-31 NOTE — Progress Notes (Signed)
Patient increasingly agitated, difficult to redirect and keep in bed. Alternative measures ineffective. Paged MD for orders. MD ordered restraints.

## 2020-12-31 NOTE — Progress Notes (Signed)
   12/31/20 0009  Assess: MEWS Score  Temp 98.7 F (37.1 C)  BP (!) 163/109  Pulse Rate (!) 132  ECG Heart Rate (!) 132  Resp (!) 23  Level of Consciousness Alert  SpO2 96 %  O2 Device Room Air  Assess: MEWS Score  MEWS Temp 0  MEWS Systolic 0  MEWS Pulse 3  MEWS RR 1  MEWS LOC 0  MEWS Score 4  MEWS Score Color Red  Assess: if the MEWS score is Yellow or Red  Were vital signs taken at a resting state? Yes  Focused Assessment No change from prior assessment  Early Detection of Sepsis Score *See Row Information* Medium  MEWS guidelines implemented *See Row Information* Yes  Treat  MEWS Interventions Escalated (See documentation below)  Pain Scale 0-10  Pain Score 0  Take Vital Signs  Increase Vital Sign Frequency  Red: Q 1hr X 4 then Q 4hr X 4, if remains red, continue Q 4hrs  Escalate  MEWS: Escalate Red: discuss with charge nurse/RN and provider, consider discussing with RRT  Notify: Charge Nurse/RN  Name of Charge Nurse/RN Notified Bard Herbert, RN  Date Charge Nurse/RN Notified 12/31/20  Time Charge Nurse/RN Notified 0015  Notify: Provider  Date Provider Notified 12/31/20  Time Provider Notified 0010  Notification Type Page  Notification Reason Change in status  Provider response Evaluate remotely  Date of Provider Response 12/31/20  Time of Provider Response 0010  MEWS Red initiated due to elevated HR, increased RR, and BP. Spoke with provider on call who placed orders for the patient.

## 2021-01-01 DIAGNOSIS — R569 Unspecified convulsions: Secondary | ICD-10-CM | POA: Diagnosis not present

## 2021-01-01 LAB — CBC
HCT: 37.1 % — ABNORMAL LOW (ref 39.0–52.0)
Hemoglobin: 12.6 g/dL — ABNORMAL LOW (ref 13.0–17.0)
MCH: 32.7 pg (ref 26.0–34.0)
MCHC: 34 g/dL (ref 30.0–36.0)
MCV: 96.4 fL (ref 80.0–100.0)
Platelets: 187 10*3/uL (ref 150–400)
RBC: 3.85 MIL/uL — ABNORMAL LOW (ref 4.22–5.81)
RDW: 12.5 % (ref 11.5–15.5)
WBC: 8.7 10*3/uL (ref 4.0–10.5)
nRBC: 0 % (ref 0.0–0.2)

## 2021-01-01 LAB — GLUCOSE, CAPILLARY
Glucose-Capillary: 117 mg/dL — ABNORMAL HIGH (ref 70–99)
Glucose-Capillary: 133 mg/dL — ABNORMAL HIGH (ref 70–99)
Glucose-Capillary: 140 mg/dL — ABNORMAL HIGH (ref 70–99)
Glucose-Capillary: 93 mg/dL (ref 70–99)
Glucose-Capillary: 93 mg/dL (ref 70–99)
Glucose-Capillary: 96 mg/dL (ref 70–99)

## 2021-01-01 LAB — MAGNESIUM: Magnesium: 1.9 mg/dL (ref 1.7–2.4)

## 2021-01-01 LAB — BASIC METABOLIC PANEL
Anion gap: 7 (ref 5–15)
BUN: 13 mg/dL (ref 8–23)
CO2: 26 mmol/L (ref 22–32)
Calcium: 8.2 mg/dL — ABNORMAL LOW (ref 8.9–10.3)
Chloride: 107 mmol/L (ref 98–111)
Creatinine, Ser: 0.99 mg/dL (ref 0.61–1.24)
GFR, Estimated: >60 mL/min (ref >60)
Glucose, Bld: 83 mg/dL (ref 70–99)
Potassium: 3.4 mmol/L — ABNORMAL LOW (ref 3.5–5.1)
Sodium: 140 mmol/L (ref 135–145)

## 2021-01-01 LAB — PHOSPHORUS: Phosphorus: 3 mg/dL (ref 2.5–4.6)

## 2021-01-01 MED ORDER — GABAPENTIN 100 MG PO CAPS
100.0000 mg | ORAL_CAPSULE | Freq: Once | ORAL | Status: AC
Start: 2021-01-01 — End: 2021-01-01
  Administered 2021-01-01: 100 mg via ORAL
  Filled 2021-01-01: qty 1

## 2021-01-01 MED ORDER — BARRIER CREAM NON-SPECIFIED: 1.0000 "application " | TOPICAL_CREAM | TOPICAL | Status: DC | PRN

## 2021-01-01 MED ORDER — ZINC OXIDE 12.8 % EX OINT
TOPICAL_OINTMENT | CUTANEOUS | Status: DC | PRN
  Filled 2021-01-01: qty 56.7

## 2021-01-01 NOTE — Progress Notes (Signed)
HR rate controlled 70s-80s.  Resps fluctuate up to high 20s-30 depending on when is awake or sleep. Pt noted with bright red rash noted to buttocks and perineal area-Dr. Evette Doffing made aware via secure chat.

## 2021-01-01 NOTE — Progress Notes (Signed)
Minimal effects noted from gabapentin. Pt continues to be impulsive and redirectable. However, due to confusion unable to retain information but for short periods. Requiring frequent redirection from staff. Order for safety sitter obtained, will continue to monitor. Pt pulling at lines occasionally, mittens in place, however pt frequently pulls off.

## 2021-01-01 NOTE — Progress Notes (Signed)
Physical Therapy Treatment Patient Details Name: Ralph West MRN: [id number] DOB: [birth year] Today's Date: 01/01/2021   History of Present Illness 87 year old male  presented to the ED with cc of aphasia 12/24/20. MRI negative; acute encephalopathy, ?seizure   PMH of anxiety, stable angina, type II diabetes, hyperlipidemia, hypertension, Alzheimer dementia, and myocardial infarction on ASA    PT Comments    PT session limited by pt experiencing bowel incontinence while ambulating in hall. Pt is able to ambulate for increased distances and demonstrates improvement in transfer quality. Pt will benefit from continued acute PT services in an effort to improve DME management and reduce falls risk. PT updates recommendations to SNF, spouse in support of further inpatient rehab.  Recommendations for follow up therapy are one component of a multi-disciplinary discharge planning process, led by the attending physician.  Recommendations may be updated based on patient status, additional functional criteria and insurance authorization.  Follow Up Recommendations  Skilled nursing-short term rehab (<3 hours/day)     Assistance Recommended at Discharge Frequent or constant Supervision/Assistance  Equipment Recommendations  None recommended by PT    Recommendations for Other Services       Precautions / Restrictions Precautions Precautions: Fall Restrictions Weight Bearing Restrictions: No     Mobility  Bed Mobility Overal bed mobility: Needs Assistance Bed Mobility: Supine to Sit     Supine to sit: Min assist;HOB elevated     General bed mobility comments: verbal cues for technique    Transfers Overall transfer level: Needs assistance Equipment used: Rolling walker (2 wheels) Transfers: Sit to/from Stand Sit to Stand: Min assist;Min guard           General transfer comment: verbal cues for hand placement and increased trunk flexion    Ambulation/Gait Ambulation/Gait  assistance: Min assist Gait Distance (Feet): 100 Feet (additional trial of 40' limited by bowel incontinence) Assistive device: Rolling walker (2 wheels) Gait Pattern/deviations: Step-through pattern;Trunk flexed Gait velocity: reduced Gait velocity interpretation: <1.8 ft/sec, indicate of risk for recurrent falls   General Gait Details: pt with slowed step-through gait, increased trunk flexion. PT providing verbal cues to maintain RW closer to BOS to improve stability   Stairs             Wheelchair Mobility    Modified Rankin (Stroke Patients Only)       Balance Overall balance assessment: Needs assistance Sitting-balance support: Feet supported;No upper extremity supported Sitting balance-Leahy Scale: Fair     Standing balance support: Single extremity supported;Bilateral upper extremity supported;Reliant on assistive device for balance Standing balance-Leahy Scale: Poor Standing balance comment: reliant on UE support of walker, minG for dynamic standing with unilateral UE support of walker                            Cognition Arousal/Alertness: Awake/alert Behavior During Therapy: WFL for tasks assessed/performed Overall Cognitive Status: Impaired/Different from baseline Area of Impairment: Orientation;Attention;Memory;Following commands;Safety/judgement;Awareness;Problem solving                 Orientation Level: Disoriented to;Situation;Place Current Attention Level: Focused Memory: Decreased recall of precautions;Decreased short-term memory Following Commands: Follows one step commands consistently Safety/Judgement: Decreased awareness of safety;Decreased awareness of deficits Awareness: Intellectual Problem Solving: Slow processing;Requires verbal cues;Requires tactile cues          Exercises      General Comments General comments (skin integrity, edema, etc.): VSS on RA, pt incontinent of stool during ambulation in hall  Pertinent Vitals/Pain Pain Assessment: No/denies pain Pain Score: 0-No pain    Home Living                          Prior Function            PT Goals (current goals can now be found in the care plan section) Acute Rehab PT Goals Patient Stated Goal: spouse reports goal to go to rehab Progress towards PT goals: Progressing toward goals    Frequency    Min 2X/week      PT Plan Current plan remains appropriate    Co-evaluation              AM-PAC PT "6 Clicks" Mobility   Outcome Measure  Help needed turning from your back to your side while in a flat bed without using bedrails?: A Little Help needed moving from lying on your back to sitting on the side of a flat bed without using bedrails?: A Little Help needed moving to and from a bed to a chair (including a wheelchair)?: A Little Help needed standing up from a chair using your arms (e.g., wheelchair or bedside chair)?: A Little Help needed to walk in hospital room?: A Little Help needed climbing 3-5 steps with a railing? : Total 6 Click Score: 16    End of Session   Activity Tolerance: Patient tolerated treatment well Patient left: in bed;with call bell/phone within reach;with bed alarm set;with family/visitor present Nurse Communication: Mobility status PT Visit Diagnosis: Unsteadiness on feet (R26.81);Other abnormalities of gait and mobility (R26.89);Difficulty in walking, not elsewhere classified (R26.2)     Time: 1112-1200 PT Time Calculation (min) (ACUTE ONLY): 48 min  Charges:  $Gait Training: 8-22 mins $Therapeutic Activity: 23-37 mins                     Zenaida Niece, PT, DPT Acute Rehabilitation Pager: [phone] Office [place] 01/01/2021, 12:51 PM

## 2021-01-01 NOTE — Progress Notes (Signed)
Pt noted to be very restless at this time, impulsive, attempting to get out of bed unassisted. Frequent redirection required at this time. Pt alert and oriented to self, confused at baseline. Telesitter camera in place, however pt remains difficult to redirect. Pt believes his wife is waiting for him and is adamant that he needs to go home. Pt reoriented to surroundings and situation, with minimal effects. Pt responding well to one to one interaction, however once staff leaves room pt continues to attempt to get up unassisted. On call provider notified, Dr. Jeanice Lim, received new order for gabapentin x1 dose with recommendation that we may still need a safety sitter. Will administer medication as ordered and follow-up with further interventions as needed.

## 2021-01-01 NOTE — Progress Notes (Signed)
HD#7 SUBJECTIVE:  Patient Summary: Ralph West is a 87 y.o. with a pertinent PMH of diabetes hyperlipidemia hypertension Alzheimer's MI, who presented with seizure-like activity and admitted for acute metabolic encephalopathy.   Overnight Events: No acute events overnight.     Interm History: Patient seen and evaluated at bedside. Alert, able to answer most questions appropriately. Wife and son at bedside as well. Discussed placement at [facility].   OBJECTIVE:  Vital Signs: Vitals:   01/01/21 0023 01/01/21 0359 01/01/21 0759 01/01/21 1204  BP: 119/62 120/63 (!) 142/74 (!) 122/58  Pulse: 78 81 81 92  Resp: (!) 25 (!) 21 20 (!) 23  Temp: 98.1 F (36.7 C) 97.9 F (36.6 C) 98.8 F (37.1 C) 98.4 F (36.9 C)  TempSrc: Axillary Axillary Oral Oral  SpO2: 97% 99% 92% 90%  Weight:      Height:       Supplemental O2: Room Air SpO2: 90 % O2 Flow Rate (L/min): 2 L/min  Filed Weights   12/24/20 0900  Weight: 75 kg     Intake/Output Summary (Last 24 hours) at 01/01/2021 1408 Last data filed at 01/01/2021 0945 Gross per 24 hour  Intake 320 ml  Output 700 ml  Net -380 ml   Net IO Since Admission: 2,591.51 mL [01/01/21 1408]  Physical Exam:  Physical Exam Constitutional:      General: He is not in acute distress.    Appearance: He is normal weight. He is not ill-appearing, toxic-appearing or diaphoretic.  HENT:     Head: Atraumatic.     Mouth/Throat:     Mouth: Mucous membranes are moist.     Pharynx: Oropharynx is clear.  Eyes:     Pupils: Pupils are equal, round, and reactive to light.  Cardiovascular:     Rate and Rhythm: Normal rate and regular rhythm.     Heart sounds: Normal heart sounds.  Pulmonary:     Effort: No respiratory distress.     Breath sounds: Normal breath sounds. No wheezing, rhonchi or rales.     Comments: Patient does not appear to be in respiratory distress, however he is taking shallow breaths with occasional apneic spells.  Saturating 94% 2  L Chest:     Chest wall: No tenderness.  Abdominal:     General: Abdomen is flat. Bowel sounds are normal. There is no distension.     Palpations: Abdomen is soft. There is no mass.     Tenderness: There is no abdominal tenderness.  Musculoskeletal:        General: Normal range of motion.     Cervical back: Normal range of motion and neck supple.  Skin:    General: Skin is warm and dry.  Neurological:     General: No focal deficit present.     Mental Status: He is alert.     Comments: Alert, oriented to self, place Fluctuating mental status.  Has appeared drowsy/somnolent at times, and upon later evaluation will appear alert.  He responds to questions appropriately and has no focal neurologic deficits, however remains confused.  Is able to state his name but not place or time.  Psychiatric:     Comments: Patient is much more alert this morning, does not appear to be responding to internal stimuli. He is calm and cooperative.    Patient Lines/Drains/Airways Status     Active Line/Drains/Airways     Name Placement date Placement time Site Days   Peripheral IV 12/24/20 18 G Anterior;Distal;Left Forearm  12/24/20  1033  Forearm  2   External Urinary Catheter 12/25/20  1200  --  1            Pertinent Labs: CBC Latest Ref Rng & Units 01/01/2021 12/31/2020 12/30/2020  WBC 4.0 - 10.5 K/uL 8.7 9.5 8.3  Hemoglobin 13.0 - 17.0 g/dL 12.6(L) 12.8(L) 12.3(L)  Hematocrit 39.0 - 52.0 % 37.1(L) 38.6(L) 36.9(L)  Platelets 150 - 400 K/uL 187 183 166    CMP Latest Ref Rng & Units 01/01/2021 12/31/2020 12/30/2020  Glucose 70 - 99 mg/dL 83 108(H) 97  BUN 8 - 23 mg/dL 13 11 9   Creatinine 0.61 - 1.24 mg/dL 0.99 1.08 0.96  Sodium 135 - 145 mmol/L 140 141 138  Potassium 3.5 - 5.1 mmol/L 3.4(L) 3.7 3.6  Chloride 98 - 111 mmol/L 107 106 107  CO2 22 - 32 mmol/L 26 23 26   Calcium 8.9 - 10.3 mg/dL 8.2(L) 8.5(L) 8.2(L)  Total Protein 6.5 - 8.1 g/dL - - 5.4(L)  Total Bilirubin 0.3 - 1.2 mg/dL - - 1.2   Alkaline Phos 38 - 126 U/L - - 57  AST 15 - 41 U/L - - 41  ALT 0 - 44 U/L - - 32    Recent Labs    01/01/21 0401 01/01/21 0758 01/01/21 1206  GLUCAP 93 96 117*      ASSESSMENT/PLAN:   Assessment:  Shun Pletz is a 87 y.o. with pertinent PMH of Alzheimer's, hypertension, hyperlipidemia admitted for new onset seizures and is on hospital day 7.  Mental status has improved and pneumonia resolved. Pending SNF placement.   Plan:  #Seizure Disorder:  new diagnosis with witnessed seizure activity. Hospital course complicated by prolonged post-ictal state and then hospital delirium. Most likely cause of the seizures is his dementia.  -Vimpat 100 mg twice daily -2 mg IV Ativan as needed for any seizures currently 5 minutes. -Aricept 10mg  daily -Patient will need outpatient neurology follow-up  #Aspiration pneumonia - resolved, afebrile, no wbc elevation, satting well on RA, lung sounds clear - completed 5 day course Unasyn, abx discontinued - will advance diet to mechanical soft/dysphagia 3, Continue thin liquids and meds whole in puree. Due to ongoing cognitive deficits, Mr. Brandenberger will needed assist with tray set up and supervision for self-feeding PRN.  - he is deconditioned, will need continued PT and d/c to SNF, family prefers [facility]  #Focal Atrial Tachycardia One brief episode overnight, significantly fewer than prior.  Episodes are paroxysmal, not related to anxiety nor exertion. No infection or dehydration. Narrow complex.  Retrograde p-waves noted on telemetry which is different from regular normal sinus rhythm. - If episode is asymptomatic and hemodynamically stable, it can be observed - continue PO metoprolol 25mg  q6 hours  Elevated hepatic enzymes and bilirubin - resolved - will continue to monitor, if pt devlops abd pain or N/V, can consider RUQ [place] for cholelithiasis.   Deconditioning - patient is chronically deconditioned. - PT following for acute rehab. Able to  ambulate increased distances with improved transfers. - He will need to be discharged to SNF, family prefers [facility]  #Spiculated opacity left upper lobe Remote history of smoking Seen on CT angio chest This can likely be worked up as an outpatient with Noncon CT in 3 months.  Hyperlipidemia  continue statin  Hypertension Continue metoprolol.  Best Practice: IVF: Fluids: LR,  VTE: enoxaparin (LOVENOX) injection 40 mg Start: 12/26/20 1445 Code: Full Family Contact: wife, at bedside. Anticipate discharge approximately 1 to 2 days  Signature: Delene Ruffini, MD  Internal Medicine Resident, PGY-1 [facility]  Pager: [phone] 2:08 PM, 01/01/2021   Please contact the on call pager after 5 pm and on weekends at [phone].

## 2021-01-02 DIAGNOSIS — R569 Unspecified convulsions: Secondary | ICD-10-CM | POA: Diagnosis not present

## 2021-01-02 LAB — GLUCOSE, CAPILLARY
Glucose-Capillary: 113 mg/dL — ABNORMAL HIGH (ref 70–99)
Glucose-Capillary: 147 mg/dL — ABNORMAL HIGH (ref 70–99)
Glucose-Capillary: 150 mg/dL — ABNORMAL HIGH (ref 70–99)
Glucose-Capillary: 78 mg/dL (ref 70–99)
Glucose-Capillary: 79 mg/dL (ref 70–99)
Glucose-Capillary: 84 mg/dL (ref 70–99)
Glucose-Capillary: 92 mg/dL (ref 70–99)

## 2021-01-02 LAB — BASIC METABOLIC PANEL
Anion gap: 5 (ref 5–15)
BUN: 14 mg/dL (ref 8–23)
CO2: 26 mmol/L (ref 22–32)
Calcium: 8.2 mg/dL — ABNORMAL LOW (ref 8.9–10.3)
Chloride: 107 mmol/L (ref 98–111)
Creatinine, Ser: 1.02 mg/dL (ref 0.61–1.24)
GFR, Estimated: >60 mL/min (ref >60)
Glucose, Bld: 79 mg/dL (ref 70–99)
Potassium: 3.4 mmol/L — ABNORMAL LOW (ref 3.5–5.1)
Sodium: 138 mmol/L (ref 135–145)

## 2021-01-02 LAB — CBC
HCT: 37.3 % — ABNORMAL LOW (ref 39.0–52.0)
Hemoglobin: 12.5 g/dL — ABNORMAL LOW (ref 13.0–17.0)
MCH: 32.7 pg (ref 26.0–34.0)
MCHC: 33.5 g/dL (ref 30.0–36.0)
MCV: 97.6 fL (ref 80.0–100.0)
Platelets: 202 10*3/uL (ref 150–400)
RBC: 3.82 MIL/uL — ABNORMAL LOW (ref 4.22–5.81)
RDW: 12.7 % (ref 11.5–15.5)
WBC: 10.5 10*3/uL (ref 4.0–10.5)
nRBC: 0 % (ref 0.0–0.2)

## 2021-01-02 MED ORDER — POTASSIUM CHLORIDE CRYS ER 20 MEQ PO TBCR
40.0000 meq | EXTENDED_RELEASE_TABLET | Freq: Two times a day (BID) | ORAL | Status: DC
  Administered 2021-01-02 – 2021-01-06 (×9): 40 meq via ORAL
  Filled 2021-01-02 (×9): qty 2

## 2021-01-02 MED ORDER — RISPERIDONE 0.5 MG PO TBDP
0.2500 mg | ORAL_TABLET | ORAL | Status: DC | PRN
  Administered 2021-01-02 – 2021-01-06 (×7): 0.25 mg via ORAL
  Filled 2021-01-02 (×11): qty 0.5

## 2021-01-02 NOTE — Evaluation (Signed)
Clinical/Bedside Swallow Evaluation Patient Details  Name: Ralph West MRN: [id number] Date of Birth: [birth year]  Today's Date: 01/02/2021 Time: SLP Start Time (ACUTE ONLY): 9518 SLP Stop Time (ACUTE ONLY): 1731 SLP Time Calculation (min) (ACUTE ONLY): 16 min  Past Medical History:  Past Medical History:  Diagnosis Date   Anxiety, generalized    Diabetes mellitus without complication ([place])    Hernia, umbilical    Hyperlipidemia    Hypertension    Kidney stones    Past Surgical History:  Past Surgical History:  Procedure Laterality Date   CHOLECYSTECTOMY     INGUINAL HERNIA REPAIR     KIDNEY STONE SURGERY     HPI:  Pt is an 87 year old male who presented to the ED with AMS, seizure-like activity in the ED with persistent tachycardia and worsening respiratory status. MRI brain negative. EEG 11/29: nonspecific cortical dysfunction in the frontotemporal region, moderate diffuse encephalopathy, no seizures or definite epileptiform discharges. Pt seen by SLP 11/30-12/6 and was discharged on dysphagia 3 and thin liquids. SLP reconsulted 12/8 due to coughing with thin liquids. PMH: anxiety, stable angina, type II diabetes, hyperlipidemia, hypertension, Alzheimer dementia, and myocardial infarction on ASA.    Assessment / Plan / Recommendation  Clinical Impression  Pt was seen for bedside swallow evaluation. Pt's reliability as a historian is questioned, but he reported intermittent coughing with p.o. intake. Oral mechanism exam was [facility] and he presented with full dentures. Pt demonstrated inconsistent throat clearing and coughing with thin liquids via cup and with nectar thick liquids via straw. Mastication and oral clearance were WFL and no other s/sx of aspiration were noted with solids or liquids. Pt's diet will be modified to dysphagia 3 and nectar thick liquids and a modified barium swallow study will be conducted to further assess swallow function. SLP Visit Diagnosis: Dysphagia,  unspecified (R13.10)    Aspiration Risk  Mild aspiration risk    Diet Recommendation Dysphagia 3 (Mech soft);Nectar-thick liquid   Liquid Administration via: Cup;No straw Medication Administration: Whole meds with puree Supervision: Staff to assist with self feeding Compensations: Slow rate;Small sips/bites;Minimize environmental distractions    Other  Recommendations Oral Care Recommendations: Oral care BID    Recommendations for follow up therapy are one component of a multi-disciplinary discharge planning process, led by the attending physician.  Recommendations may be updated based on patient status, additional functional criteria and insurance authorization.  Follow up Recommendations  (TBD)      Assistance Recommended at Discharge Frequent or constant Supervision/Assistance  Functional Status Assessment Patient has had a recent decline in their functional status and demonstrates the ability to make significant improvements in function in a reasonable and predictable amount of time.  Frequency and Duration min 2x/week  2 weeks       Prognosis Prognosis for Safe Diet Advancement: Good Barriers to Reach Goals: Cognitive deficits      Swallow Study   General Date of Onset: 12/24/20 HPI: Pt is an 87 year old male who presented to the ED with AMS, seizure-like activity in the ED with persistent tachycardia and worsening respiratory status. MRI brain negative. EEG 11/29: nonspecific cortical dysfunction in the frontotemporal region, moderate diffuse encephalopathy, no seizures or definite epileptiform discharges. Pt seen by SLP 11/30-12/6 and was discharged on dysphagia 3 and thin liquids. SLP reconsulted 12/8 due to coughing with thin liquids. PMH: anxiety, stable angina, type II diabetes, hyperlipidemia, hypertension, Alzheimer dementia, and myocardial infarction on ASA. Type of Study: Bedside Swallow Evaluation Previous Swallow Assessment: See HPI  Diet Prior to this Study:  Dysphagia 3 (soft);Thin liquids Temperature Spikes Noted: No Respiratory Status: Room air History of Recent Intubation: No Behavior/Cognition: Alert;Cooperative;Pleasant mood;Doesn't follow directions;Requires cueing Oral Cavity Assessment: Within Functional Limits Oral Care Completed by SLP: No Oral Cavity - Dentition: Dentures, top;Dentures, bottom Vision: Functional for self-feeding Self-Feeding Abilities: Needs assist Patient Positioning: Upright in bed;Postural control adequate for testing Baseline Vocal Quality: Normal Volitional Cough: Strong Volitional Swallow: Able to elicit    Oral/Motor/Sensory Function Overall Oral Motor/Sensory Function: Within functional limits   Ice Chips Ice chips: Not tested   Thin Liquid Thin Liquid: Impaired Presentation: Straw;Cup Pharyngeal  Phase Impairments: Throat Clearing - Delayed;Throat Clearing - Immediate    Nectar Thick Nectar Thick Liquid: Within functional limits Presentation: Straw   Honey Thick Honey Thick Liquid: Not tested   Puree Puree: Within functional limits Presentation: Spoon   Solid     Solid: Within functional limits Presentation: [place] I. Hardin Negus, [place], [place] Office number [phone] Pager [place] 01/02/2021,5:38 PM

## 2021-01-02 NOTE — Progress Notes (Addendum)
HD#8 SUBJECTIVE:  Patient Summary: Ralph West is a 87 y.o. with a pertinent PMH of diabetes hyperlipidemia hypertension Alzheimer's MI, who presented with seizure-like activity and admitted for acute metabolic encephalopathy.   Overnight Events: Patient was confused and agitated overnight. Attempted to get out of bed, given Gabapentin 100mg  with minimal benefit. Noted to be pulling at lines, gloves and sitter ordered.     Interm History: Patient seen and evaluated at bedside. He is alert and oriented to person, but not place or time.Unable to recall eating breakfast this AM. Calm and engages in interview.    OBJECTIVE:  Vital Signs: Vitals:   01/02/21 0346 01/02/21 0734 01/02/21 0850 01/02/21 1138  BP: 136/76 140/78 121/67 138/75  Pulse: 84 77 82 71  Resp: 20 19 (!) 24 (!) 22  Temp: 98.2 F (36.8 C) 99.4 F (37.4 C)  98.4 F (36.9 C)  TempSrc: Oral Axillary  Oral  SpO2: 96% 95% 93% 90%  Weight:      Height:       Supplemental O2: Room Air SpO2: 90 % O2 Flow Rate (L/min): 2 L/min  Filed Weights   12/24/20 0900  Weight: 75 kg     Intake/Output Summary (Last 24 hours) at 01/02/2021 1445 Last data filed at 01/02/2021 0859 Gross per 24 hour  Intake 455 ml  Output 400 ml  Net 55 ml   Net IO Since Admission: 2,681.43 mL [01/02/21 1445]  Physical Exam:  Physical Exam Constitutional:      General: He is not in acute distress.    Appearance: He is normal weight. He is not ill-appearing, toxic-appearing or diaphoretic.     Comments: Oriented to person, not place or time, short term memory impaired.  HENT:     Head: Atraumatic.     Mouth/Throat:     Mouth: Mucous membranes are moist.     Pharynx: Oropharynx is clear.  Eyes:     Pupils: Pupils are equal, round, and reactive to light.  Cardiovascular:     Rate and Rhythm: Normal rate and regular rhythm.     Heart sounds: Normal heart sounds.  Pulmonary:     Effort: No respiratory distress.     Breath sounds:  Normal breath sounds. No wheezing, rhonchi or rales.     Comments: Patient does not appear to be in respiratory distress, however he is taking shallow breaths with occasional apneic spells.  Saturating 94% 2 L Chest:     Chest wall: No tenderness.  Abdominal:     General: Abdomen is flat. Bowel sounds are normal. There is no distension.     Palpations: Abdomen is soft. There is no mass.     Tenderness: There is no abdominal tenderness.  Musculoskeletal:        General: Normal range of motion.     Cervical back: Normal range of motion and neck supple.  Skin:    General: Skin is warm and dry.  Neurological:     General: No focal deficit present.     Mental Status: He is alert.     Comments: Alert, oriented to self, place Fluctuating mental status.  Has appeared drowsy/somnolent at times, and upon later evaluation will appear alert.  He responds to questions appropriately and has no focal neurologic deficits, however remains confused.  Is able to state his name but not place or time.  Psychiatric:     Comments: does not appear to be responding to internal stimuli. He is calm and  cooperative.    Patient Lines/Drains/Airways Status     Active Line/Drains/Airways     Name Placement date Placement time Site Days   Peripheral IV 12/24/20 18 G Anterior;Distal;Left Forearm 12/24/20  1033  Forearm  2   External Urinary Catheter 12/25/20  1200  --  1            Pertinent Labs: CBC Latest Ref Rng & Units 01/02/2021 01/01/2021 12/31/2020  WBC 4.0 - 10.5 K/uL 10.5 8.7 9.5  Hemoglobin 13.0 - 17.0 g/dL 12.5(L) 12.6(L) 12.8(L)  Hematocrit 39.0 - 52.0 % 37.3(L) 37.1(L) 38.6(L)  Platelets 150 - 400 K/uL 202 187 183    CMP Latest Ref Rng & Units 01/02/2021 01/01/2021 12/31/2020  Glucose 70 - 99 mg/dL 79 83 108(H)  BUN 8 - 23 mg/dL 14 13 11   Creatinine 0.61 - 1.24 mg/dL 1.02 0.99 1.08  Sodium 135 - 145 mmol/L 138 140 141  Potassium 3.5 - 5.1 mmol/L 3.4(L) 3.4(L) 3.7  Chloride 98 - 111 mmol/L  107 107 106  CO2 22 - 32 mmol/L 26 26 23   Calcium 8.9 - 10.3 mg/dL 8.2(L) 8.2(L) 8.5(L)  Total Protein 6.5 - 8.1 g/dL - - -  Total Bilirubin 0.3 - 1.2 mg/dL - - -  Alkaline Phos 38 - 126 U/L - - -  AST 15 - 41 U/L - - -  ALT 0 - 44 U/L - - -    Recent Labs    01/02/21 0352 01/02/21 0739 01/02/21 1144  GLUCAP 84 92 150*      ASSESSMENT/PLAN:   Assessment:  Ralph West is a 87 y.o. with pertinent PMH of Alzheimer's, hypertension, hyperlipidemia admitted for new onset seizures and is on hospital day 8.  Mental status has improved and pneumonia resolved. Pending SNF placement.   Plan:  #Seizure Disorder:  new diagnosis with witnessed seizure activity. Hospital course complicated by prolonged post-ictal state and then hospital delirium. Most likely cause of the seizures is his dementia.  -Vimpat 100 mg twice daily -2 mg IV Ativan as needed for any seizures currently 5 minutes. -Aricept 10mg  daily -Patient will need outpatient neurology follow-up - Patient's mental status continues to wax and wane. He became agitated last night and required sitter and mittens to be placed. PRN Risperidone 0.25mg  has been ordered for agitation. If he becomes profoundly agitated, can give Haldol. Would like to avoid sitter and restraints if possible as these can worsen delerium and prolong hospitalization.  #Aspiration pneumonia - resolved, afebrile, no wbc elevation, satting well on RA, lung sounds clear - completed 5 day course Unasyn, abx discontinued - will advance diet to mechanical soft/dysphagia 3, Continue thin liquids and meds whole in puree. Due to ongoing cognitive deficits, Ralph West will needed assist with tray set up and supervision for self-feeding PRN.  - he is deconditioned, will need continued PT and d/c to SNF, family prefers [facility]  #Focal Atrial Tachycardia One brief episode overnight, significantly fewer than prior.  Episodes are paroxysmal, not related to anxiety nor  exertion. No infection or dehydration. Narrow complex.  Retrograde p-waves noted on telemetry which is different from regular normal sinus rhythm. - If episode is asymptomatic and hemodynamically stable, it can be observed - continue PO metoprolol 25mg  q6 hours  Elevated hepatic enzymes and bilirubin - resolved - will continue to monitor, if pt devlops abd pain or N/V, can consider RUQ [place] for cholelithiasis.   Deconditioning - patient is chronically deconditioned. - PT following for acute rehab. Able  to ambulate increased distances with improved transfers. - He will need to be discharged to SNF, family prefers [facility]  #Spiculated opacity left upper lobe Remote history of smoking Seen on CT angio chest This can likely be worked up as an outpatient with Noncon CT in 3 months.  Hyperlipidemia  continue statin  Hypertension Continue metoprolol.  Best Practice: IVF: Fluids: LR,  VTE: enoxaparin (LOVENOX) injection 40 mg Start: 12/26/20 1445 Code: Full Family Contact: wife, at bedside. Anticipate discharge approximately 1 to 2 days  Signature: Delene Ruffini, MD  Internal Medicine Resident, PGY-1 [facility]  Pager: [phone] 2:45 PM, 01/02/2021   Please contact the on call pager after 5 pm and on weekends at [phone].

## 2021-01-02 NOTE — Evaluation (Signed)
Occupational Therapy Evaluation Patient Details Name: Ralph West MRN: [id number] DOB: [birth year] Today's Date: 01/02/2021   History of Present Illness 87 year old male  presented to the ED with cc of aphasia 12/24/20. MRI negative; acute encephalopathy, ?seizure   PMH of anxiety, stable angina, type II diabetes, hyperlipidemia, hypertension, Alzheimer dementia, and myocardial infarction on ASA   Clinical Impression   Pt making good progress with OT goals. Pt motivated to ambulate to the bathroom and sink this session to complete toileting and grooming. Overall, pt requiring min A due to verbal cuing for sequencing and safety. Pt with increased activity tolerance as well, able to tolerate being up for 5+ mins with no rest break. Continue to recommend SNF, as wife is unable to provide any physical assist at home. OT will follow acutely.       Recommendations for follow up therapy are one component of a multi-disciplinary discharge planning process, led by the attending physician.  Recommendations may be updated based on patient status, additional functional criteria and insurance authorization.   Follow Up Recommendations  Skilled nursing-short term rehab (<3 hours/day)    Assistance Recommended at Discharge Frequent or constant Supervision/Assistance  Functional Status Assessment     Equipment Recommendations  None recommended by OT    Recommendations for Other Services       Precautions / Restrictions Precautions Precautions: Fall Restrictions Weight Bearing Restrictions: No      Mobility Bed Mobility Overal bed mobility: Needs Assistance Bed Mobility: Supine to Sit;Sit to Supine     Supine to sit: Min assist Sit to supine: Min guard   General bed mobility comments: Min A hand held assist to sit up, min guard for safety on return to supine    Transfers Overall transfer level: Needs assistance Equipment used: Rolling walker (2 wheels) Transfers: Sit to/from Stand Sit  to Stand: Min guard           General transfer comment: verbal cues for hand placement and increased trunk flexion      Balance Overall balance assessment: Needs assistance Sitting-balance support: Feet supported;No upper extremity supported Sitting balance-Leahy Scale: Good     Standing balance support: Single extremity supported;Bilateral upper extremity supported;Reliant on assistive device for balance Standing balance-Leahy Scale: Poor Standing balance comment: reliant on UE support of walker, minG for dynamic standing with unilateral UE support of walker                           ADL either performed or assessed with clinical judgement   ADL Overall ADL's : Needs assistance/impaired     Grooming: Minimal assistance;Standing Grooming Details (indicate cue type and reason): Pt needing min verbal cues and min guard physically to maintain balance standing at sink                 Toilet Transfer: Minimal assistance;Ambulation Toilet Transfer Details (indicate cue type and reason): For safety and decreased activity tolerance.         Functional mobility during ADLs: Minimal assistance General ADL Comments: Pt overall with increased activity tolerance, increased strength, and motivation to ambulate.     Vision         Perception     Praxis      Pertinent Vitals/Pain Pain Assessment: No/denies pain     Hand Dominance     Extremity/Trunk Assessment             Communication     Cognition Arousal/Alertness:  Awake/alert Behavior During Therapy: WFL for tasks assessed/performed Overall Cognitive Status: History of cognitive impairments - at baseline                                       General Comments  VSS on RA, Wife present and supportive    Exercises     Shoulder Instructions      Home Living                                          Prior Functioning/Environment                           OT Problem List:        OT Treatment/Interventions:      OT Goals(Current goals can be found in the care plan section) Acute Rehab OT Goals Patient Stated Goal: None stated OT Goal Formulation: With family Time For Goal Achievement: 01/09/21 Potential to Achieve Goals: Fair ADL Goals Pt Will Perform Grooming: with min assist;sitting Pt Will Perform Lower Body Bathing: with mod assist;sitting/lateral leans;sit to/from stand Pt Will Perform Lower Body Dressing: with mod assist;sitting/lateral leans;sit to/from stand Pt Will Transfer to Toilet: with min assist;stand pivot transfer Pt Will Perform Toileting - Clothing Manipulation and hygiene: with mod assist;sitting/lateral leans;sit to/from stand Additional ADL Goal #1: Pt will follow 1 step commands 75% of the time.  OT Frequency: Min 2X/week   Barriers to D/C:            Co-evaluation              AM-PAC OT "6 Clicks" Daily Activity     Outcome Measure Help from another person eating meals?: A Little Help from another person taking care of personal grooming?: A Little Help from another person toileting, which includes using toliet, bedpan, or urinal?: A Lot Help from another person bathing (including washing, rinsing, drying)?: A Lot Help from another person to put on and taking off regular upper body clothing?: A Lot Help from another person to put on and taking off regular lower body clothing?: A Lot 6 Click Score: 14   End of Session Equipment Utilized During Treatment: Gait belt;Rolling walker (2 wheels) Nurse Communication: Mobility status  Activity Tolerance: Patient tolerated treatment well Patient left: in bed;with call bell/phone within reach;with bed alarm set;with family/visitor present  OT Visit Diagnosis: Unsteadiness on feet (R26.81);Other abnormalities of gait and mobility (R26.89);Muscle weakness (generalized) (M62.81);Other symptoms and signs involving cognitive function                Time:  8657-8469 OT Time Calculation (min): 23 min Charges:  OT General Charges $OT Visit: 1 Visit OT Treatments $Self Care/Home Management : 8-22 mins $Therapeutic Activity: 8-22 mins  Ralph West H., OTR/L Acute Rehabilitation  Ralph West Ralph West 01/02/2021, 5:32 PM

## 2021-01-03 ENCOUNTER — Inpatient Hospital Stay (HOSPITAL_COMMUNITY): Payer: Medicare Other

## 2021-01-03 DIAGNOSIS — R569 Unspecified convulsions: Secondary | ICD-10-CM | POA: Diagnosis not present

## 2021-01-03 LAB — BASIC METABOLIC PANEL
Anion gap: 12 (ref 5–15)
BUN: 13 mg/dL (ref 8–23)
CO2: 20 mmol/L — ABNORMAL LOW (ref 22–32)
Calcium: 8.3 mg/dL — ABNORMAL LOW (ref 8.9–10.3)
Chloride: 105 mmol/L (ref 98–111)
Creatinine, Ser: 0.91 mg/dL (ref 0.61–1.24)
GFR, Estimated: >60 mL/min (ref >60)
Glucose, Bld: 97 mg/dL (ref 70–99)
Potassium: 5.5 mmol/L — ABNORMAL HIGH (ref 3.5–5.1)
Sodium: 137 mmol/L (ref 135–145)

## 2021-01-03 LAB — GLUCOSE, CAPILLARY
Glucose-Capillary: 119 mg/dL — ABNORMAL HIGH (ref 70–99)
Glucose-Capillary: 106 mg/dL — ABNORMAL HIGH (ref 70–99)
Glucose-Capillary: 136 mg/dL — ABNORMAL HIGH (ref 70–99)
Glucose-Capillary: 143 mg/dL — ABNORMAL HIGH (ref 70–99)
Glucose-Capillary: 129 mg/dL — ABNORMAL HIGH (ref 70–99)

## 2021-01-03 LAB — CBC
HCT: 39.9 % (ref 39.0–52.0)
Hemoglobin: 13.3 g/dL (ref 13.0–17.0)
MCH: 32.5 pg (ref 26.0–34.0)
MCHC: 33.3 g/dL (ref 30.0–36.0)
MCV: 97.6 fL (ref 80.0–100.0)
Platelets: 233 10*3/uL (ref 150–400)
RBC: 4.09 MIL/uL — ABNORMAL LOW (ref 4.22–5.81)
RDW: 12.7 % (ref 11.5–15.5)
WBC: 11.1 10*3/uL — ABNORMAL HIGH (ref 4.0–10.5)
nRBC: 0 % (ref 0.0–0.2)

## 2021-01-03 LAB — POTASSIUM: Potassium: 4.5 mmol/L (ref 3.5–5.1)

## 2021-01-03 MED ORDER — TRAZODONE HCL 50 MG PO TABS
25.0000 mg | ORAL_TABLET | Freq: Every day | ORAL | Status: DC
  Administered 2021-01-03 – 2021-01-05 (×3): 25 mg via ORAL
  Filled 2021-01-03 (×3): qty 1

## 2021-01-03 NOTE — Progress Notes (Signed)
Gave pt a bath to help calm down. Pt is now relaxing and on his way to sleep

## 2021-01-03 NOTE — Progress Notes (Signed)
Modified Barium Swallow Progress Note  Patient Details  Name: Ralph West MRN: [id number] Date of Birth: 1932-03-27  Today's Date: 01/03/2021  Modified Barium Swallow completed.  Full report located under Chart Review in the Imaging Section.  Brief recommendations include the following:  Clinical Impression  Pt presented with oropharyngeal dysphagia characterized by reduced tongue base retraction, reduced anterior laryngeal movement, and a pharyngeal delay. He demonstrated vallecular residue, and pyriform sinus residue which were improved with pt's independent use of secondary swallows. Penetration (PAS 3) and aspiration (PAS 7, 8) were inconsistently noted with thin liquids. Pt's airway protection with thin liquids progressively declined as the study progressed as his attention waned. SLP suspects that this may be a contributing factor to his performance at bedside. Frequency and severity of laryngeal invasion worsened with self-feeding of thin liquids and nectar thick liquids via cup. This was partly due to reduced laryngeal closure and pt's posterior head tilt exacerbating premature spillage. Pt's current diet of dysphagia 3 solids and nectar thick liquids will be continued and SLP will follow for treatment.   Swallow Evaluation Recommendations       SLP Diet Recommendations: Dysphagia 3 (Mech soft) solids;Nectar thick liquid   Liquid Administration via: Cup;Straw   Medication Administration: Whole meds with puree   Supervision: Full supervision/cueing for compensatory strategies;Staff to assist with self feeding   Compensations: Slow rate;Small sips/bites;Minimize environmental distractions;Multiple dry swallows after each bite/sip   Postural Changes: Seated upright at 90 degrees   Oral Care Recommendations: Oral care BID      Katherina Wimer I. Hardin Negus, [place], [place] Office number [phone] Pager [place] 01/03/2021,10:15  AM

## 2021-01-03 NOTE — TOC Progression Note (Signed)
Transition of Care [facility]) - Progression Note    Patient Details  Name: Jerrad Mendibles MRN: [id number] Date of Birth: Nov 29, 1932  Transition of Care [facility]) CM/SW Jane [place], LCSW Phone Number: 01/03/2021, 9:20 AM  Clinical Narrative:    Lanell Matar requested updated therapy notes. CSW sent updated notes as requested.    Expected Discharge Plan: [place] Barriers to Discharge: Continued Medical Work up  Expected Discharge Plan and Services Expected Discharge Plan: [place]   Discharge Planning Services: CM Consult Post Acute Care Choice: [place] Living arrangements for the past 2 months: Single Family Home                                       Social Determinants of Health (SDOH) Interventions    Readmission Risk Interventions No flowsheet data found.

## 2021-01-03 NOTE — Progress Notes (Signed)
Physical Therapy Treatment Patient Details Name: Ralph West MRN: [id number] DOB: 1932/08/13 Today's Date: 01/03/2021   History of Present Illness 87 year old male  presented to the ED with cc of aphasia 12/24/20. MRI negative; acute encephalopathy, ?seizure   PMH of anxiety, stable angina, type II diabetes, hyperlipidemia, hypertension, Alzheimer dementia, and myocardial infarction on ASA    PT Comments    Pt making steady progress. Continue to recommend SNF as pt is not at a mobility level that his wife can manage at home.    Recommendations for follow up therapy are one component of a multi-disciplinary discharge planning process, led by the attending physician.  Recommendations may be updated based on patient status, additional functional criteria and insurance authorization.  Follow Up Recommendations  Skilled nursing-short term rehab (<3 hours/day)     Assistance Recommended at Discharge Frequent or constant Supervision/Assistance  Equipment Recommendations  None recommended by PT    Recommendations for Other Services       Precautions / Restrictions Precautions Precautions: Fall     Mobility  Bed Mobility Overal bed mobility: Needs Assistance Bed Mobility: Supine to Sit;Sit to Supine     Supine to sit: Min assist Sit to supine: Supervision   General bed mobility comments: Assist to initiate coming to EOB. Assist to bring legs off of bed and elevate trunk into sitting. Returned to supine repeatedly with supervision.    Transfers Overall transfer level: Needs assistance Equipment used: Rollator (4 wheels) Transfers: Sit to/from Stand Sit to Stand: Min assist;Min guard           General transfer comment: Initially min assist to bring hips up and for balance. Progressed to min guard when pt initiated coming to stand    Ambulation/Gait Ambulation/Gait assistance: Min assist Gait Distance (Feet): 125 Feet Assistive device: Rollator (4 wheels) Gait  Pattern/deviations: Step-through pattern;Trunk flexed Gait velocity: Too fast for his balance Gait velocity interpretation: >2.62 ft/sec, indicative of community ambulatory   General Gait Details: Assist for balance and safety. Pt pushing rollator too far in front of him despite multiple verbal/tactile cues to stay closer to the walker.   Stairs             Wheelchair Mobility    Modified Rankin (Stroke Patients Only)       Balance Overall balance assessment: Needs assistance Sitting-balance support: Feet supported;No upper extremity supported Sitting balance-Leahy Scale: Fair Sitting balance - Comments: Repeated cues to stay sitting and not lie back down   Standing balance support: Bilateral upper extremity supported Standing balance-Leahy Scale: Poor Standing balance comment: rollator and min assist for static standing                            Cognition Arousal/Alertness: Awake/alert Behavior During Therapy: Impulsive Overall Cognitive Status: History of cognitive impairments - at baseline Area of Impairment: Orientation;Attention;Memory;Following commands;Safety/judgement;Awareness;Problem solving                 Orientation Level: Disoriented to;Situation;Place;Time Current Attention Level: Sustained Memory: Decreased recall of precautions;Decreased short-term memory Following Commands: Follows one step commands with increased time Safety/Judgement: Decreased awareness of safety;Decreased awareness of deficits Awareness: Intellectual Problem Solving: Slow processing;Requires verbal cues;Requires tactile cues          Exercises      General Comments        Pertinent Vitals/Pain Pain Assessment: PAINAD Breathing: normal Negative Vocalization: none Facial Expression: smiling or inexpressive Body Language: relaxed Consolability:  no need to console PAINAD Score: 0    Home Living                          Prior Function             PT Goals (current goals can now be found in the care plan section) Acute Rehab PT Goals Patient Stated Goal: not stated Progress towards PT goals: Progressing toward goals    Frequency    Min 2X/week      PT Plan Current plan remains appropriate    Co-evaluation              AM-PAC PT "6 Clicks" Mobility   Outcome Measure  Help needed turning from your back to your side while in a flat bed without using bedrails?: A Little Help needed moving from lying on your back to sitting on the side of a flat bed without using bedrails?: A Little Help needed moving to and from a bed to a chair (including a wheelchair)?: A Little Help needed standing up from a chair using your arms (e.g., wheelchair or bedside chair)?: A Little Help needed to walk in hospital room?: A Little Help needed climbing 3-5 steps with a railing? : Total 6 Click Score: 16    End of Session Equipment Utilized During Treatment: Gait belt Activity Tolerance: Patient tolerated treatment well Patient left: in bed;with call bell/phone within reach;with bed alarm set;with family/visitor present Nurse Communication: Mobility status (tech) PT Visit Diagnosis: Unsteadiness on feet (R26.81);Other abnormalities of gait and mobility (R26.89);Difficulty in walking, not elsewhere classified (R26.2)     Time: 0981-1914 PT Time Calculation (min) (ACUTE ONLY): 22 min  Charges:  $Gait Training: 8-22 mins                     [place] Pager [phone] Office [place] 01/03/2021, 4:14 PM

## 2021-01-03 NOTE — Progress Notes (Signed)
Pt is agitated and very jittery

## 2021-01-03 NOTE — Progress Notes (Addendum)
HD#9 SUBJECTIVE:  Patient Summary: Ralph West is a 87 y.o. with a pertinent PMH of diabetes hyperlipidemia hypertension Alzheimer's MI, who presented with seizure-like activity and admitted for acute metabolic encephalopathy.   Overnight Events: Patient was confused and agitated overnight. Attempted to get out of bed, given Gabapentin 100mg  with minimal benefit. Noted to be pulling at lines, gloves and sitter ordered.     Interm History: Patient seen and evaluated at bedside. He is alert and oriented to person, but not place or time.Unable to recall eating breakfast this AM. Calm and engages in interview.    OBJECTIVE:  Vital Signs: Vitals:   01/03/21 0315 01/03/21 0737 01/03/21 0842 01/03/21 1117  BP:   (!) 167/98   Pulse:   87   Resp: (!) 21  19   Temp: 97.7 F (36.5 C) 98.1 F (36.7 C)  98.1 F (36.7 C)  TempSrc: Axillary Oral  Oral  SpO2: 96%  99%   Weight:      Height:       Supplemental O2: Room Air SpO2: 99 % O2 Flow Rate (L/min): 2 L/min  Filed Weights   12/24/20 0900 01/02/21 1935  Weight: 75 kg 76.1 kg     Intake/Output Summary (Last 24 hours) at 01/03/2021 1505 Last data filed at 01/03/2021 1446 Gross per 24 hour  Intake 390 ml  Output 1700 ml  Net -1310 ml    Net IO Since Admission: 1,371.43 mL [01/03/21 1505]  Physical Exam:  Physical Exam Constitutional:      General: He is not in acute distress.    Appearance: He is normal weight. He is not ill-appearing, toxic-appearing or diaphoretic.     Comments: Oriented to person, not place or time, short term memory impaired.  HENT:     Head: Atraumatic.     Mouth/Throat:     Mouth: Mucous membranes are moist.     Pharynx: Oropharynx is clear.  Eyes:     Pupils: Pupils are equal, round, and reactive to light.  Cardiovascular:     Rate and Rhythm: Normal rate and regular rhythm.     Heart sounds: Normal heart sounds.  Pulmonary:     Effort: No respiratory distress.     Breath sounds: Normal  breath sounds. No wheezing, rhonchi or rales.     Comments: Patient does not appear to be in respiratory distress, however he is taking shallow breaths with occasional apneic spells.  Saturating 94% 2 L Chest:     Chest wall: No tenderness.  Abdominal:     General: Abdomen is flat. Bowel sounds are normal. There is no distension.     Palpations: Abdomen is soft. There is no mass.     Tenderness: There is no abdominal tenderness.  Musculoskeletal:        General: Normal range of motion.     Cervical back: Normal range of motion and neck supple.  Skin:    General: Skin is warm and dry.  Neurological:     General: No focal deficit present.     Mental Status: He is alert.     Comments: Alert, oriented to self, place Fluctuating mental status.  Has appeared drowsy/somnolent at times, and upon later evaluation will appear alert.  He responds to questions appropriately and has no focal neurologic deficits, however remains confused.  Is able to state his name but not place or time.  Psychiatric:     Comments: does not appear to be responding to internal  stimuli. He is calm and cooperative.    Patient Lines/Drains/Airways Status     Active Line/Drains/Airways     Name Placement date Placement time Site Days   Peripheral IV 12/24/20 18 G Anterior;Distal;Left Forearm 12/24/20  1033  Forearm  2   External Urinary Catheter 12/25/20  1200  --  1            Pertinent Labs: CBC Latest Ref Rng & Units 01/03/2021 01/02/2021 01/01/2021  WBC 4.0 - 10.5 K/uL 11.1(H) 10.5 8.7  Hemoglobin 13.0 - 17.0 g/dL 13.3 12.5(L) 12.6(L)  Hematocrit 39.0 - 52.0 % 39.9 37.3(L) 37.1(L)  Platelets 150 - 400 K/uL 233 202 187    CMP Latest Ref Rng & Units 01/03/2021 01/03/2021 01/02/2021  Glucose 70 - 99 mg/dL - 97 79  BUN 8 - 23 mg/dL - 13 14  Creatinine 0.61 - 1.24 mg/dL - 0.91 1.02  Sodium 135 - 145 mmol/L - 137 138  Potassium 3.5 - 5.1 mmol/L 4.5 5.5(H) 3.4(L)  Chloride 98 - 111 mmol/L - 105 107  CO2 22 -  32 mmol/L - 20(L) 26  Calcium 8.9 - 10.3 mg/dL - 8.3(L) 8.2(L)  Total Protein 6.5 - 8.1 g/dL - - -  Total Bilirubin 0.3 - 1.2 mg/dL - - -  Alkaline Phos 38 - 126 U/L - - -  AST 15 - 41 U/L - - -  ALT 0 - 44 U/L - - -    Recent Labs    01/03/21 0318 01/03/21 0749 01/03/21 1137  GLUCAP 119* 106* 136*       ASSESSMENT/PLAN:   Assessment:  Ralph West is a 87 y.o. with pertinent PMH of Alzheimer's, hypertension, hyperlipidemia admitted for new onset seizures and is on hospital day 9.  Mental status has improved and pneumonia resolved. Pending SNF placement.   Plan:  #Hospital Delirium: waxing and waning mental status with sundowning. Agitation is mild, does pull at IV at times. He is at risk for this given underlying dementia.  - wife to bring glasses, patient no longer uses hearing aides - out of bed to the chair during the day - discontinue telemetry - trazadone 25mg  PO at night - Risperidone 0.25mg  for severe agitation or safety concerns  #Seizure Disorder:  new diagnosis with witnessed seizure activity. Hospital course complicated by prolonged post-ictal state and then hospital delirium. Most likely cause of the seizures is his dementia.  -Vimpat 100 mg twice daily -2 mg IV Ativan as needed for any seizures currently 5 minutes. -Aricept 10mg  daily -Patient will need outpatient neurology follow-up  #Aspiration pneumonia - resolved, afebrile, mild WBC elevation today, satting well on RA, lung sounds clear - completed 5 day course Unasyn, abx discontinued - will advance diet to mechanical soft/dysphagia 3 with nectar thick liquids, meds whole in puree. Due to ongoing cognitive deficits, Ralph West will needed assist with tray set up and supervision for self-feeding PRN.  - he is deconditioned, will need continued PT and d/c to SNF, family prefers [facility]  #Focal Atrial Tachycardia - resolved, patient has not had any tachycardia for several days. Telemetry has been  discontinued to help promote resolution of delirium.  - If Focal AT episode is asymptomatic and hemodynamically stable, it can be observed - continue PO metoprolol 25mg  q6 hours  Elevated hepatic enzymes and bilirubin - resolved - will continue to monitor, if pt devlops abd pain or N/V, can consider RUQ [place] for cholelithiasis.   Deconditioning - patient is chronically deconditioned. -  PT following for acute rehab. Able to ambulate increased distances with improved transfers. - He will need to be discharged to SNF, family prefers [facility]  #Spiculated opacity left upper lobe Remote history of smoking Seen on CT angio chest This can likely be worked up as an outpatient with Noncon CT in 3 months.  Hyperlipidemia  continue statin  Hypertension Continue metoprolol.  Best Practice: IVF: Fluids: LR,  VTE: enoxaparin (LOVENOX) injection 40 mg Start: 12/26/20 1445 Code: Full Family Contact: wife, at bedside. Anticipate discharge approximately 1 to 2 days  Signature: Delene Ruffini, MD  Internal Medicine Resident, PGY-1 [facility]  Pager: [phone] 3:05 PM, 01/03/2021   Please contact the on call pager after 5 pm and on weekends at [phone].

## 2021-01-04 DIAGNOSIS — R569 Unspecified convulsions: Secondary | ICD-10-CM | POA: Diagnosis not present

## 2021-01-04 LAB — CBC WITH DIFFERENTIAL/PLATELET
Abs Immature Granulocytes: 0.05 10*3/uL (ref 0.00–0.07)
Basophils Absolute: 0 10*3/uL (ref 0.0–0.1)
Basophils Relative: 0 %
Eosinophils Absolute: 0.4 10*3/uL (ref 0.0–0.5)
Eosinophils Relative: 4 %
HCT: 38.7 % — ABNORMAL LOW (ref 39.0–52.0)
Hemoglobin: 13.2 g/dL (ref 13.0–17.0)
Immature Granulocytes: 1 %
Lymphocytes Relative: 25 %
Lymphs Abs: 2.6 10*3/uL (ref 0.7–4.0)
MCH: 32.7 pg (ref 26.0–34.0)
MCHC: 34.1 g/dL (ref 30.0–36.0)
MCV: 95.8 fL (ref 80.0–100.0)
Monocytes Absolute: 0.8 10*3/uL (ref 0.1–1.0)
Monocytes Relative: 8 %
Neutro Abs: 6.5 10*3/uL (ref 1.7–7.7)
Neutrophils Relative %: 62 %
Platelets: 247 10*3/uL (ref 150–400)
RBC: 4.04 MIL/uL — ABNORMAL LOW (ref 4.22–5.81)
RDW: 12.5 % (ref 11.5–15.5)
WBC: 10.4 10*3/uL (ref 4.0–10.5)
nRBC: 0 % (ref 0.0–0.2)

## 2021-01-04 LAB — BASIC METABOLIC PANEL
Anion gap: 8 (ref 5–15)
BUN: 11 mg/dL (ref 8–23)
CO2: 26 mmol/L (ref 22–32)
Calcium: 8.6 mg/dL — ABNORMAL LOW (ref 8.9–10.3)
Chloride: 105 mmol/L (ref 98–111)
Creatinine, Ser: 0.89 mg/dL (ref 0.61–1.24)
GFR, Estimated: >60 mL/min (ref >60)
Glucose, Bld: 102 mg/dL — ABNORMAL HIGH (ref 70–99)
Potassium: 4 mmol/L (ref 3.5–5.1)
Sodium: 139 mmol/L (ref 135–145)

## 2021-01-04 LAB — GLUCOSE, CAPILLARY
Glucose-Capillary: 103 mg/dL — ABNORMAL HIGH (ref 70–99)
Glucose-Capillary: 112 mg/dL — ABNORMAL HIGH (ref 70–99)
Glucose-Capillary: 126 mg/dL — ABNORMAL HIGH (ref 70–99)
Glucose-Capillary: 140 mg/dL — ABNORMAL HIGH (ref 70–99)
Glucose-Capillary: 88 mg/dL (ref 70–99)

## 2021-01-04 NOTE — Progress Notes (Signed)
HD#10 SUBJECTIVE:  Patient Summary: Ralph West is a 87 y.o. with a pertinent PMH of diabetes hyperlipidemia hypertension Alzheimer's MI, who presented with seizure-like activity and admitted for acute metabolic encephalopathy.   Overnight Events: No acute overnight events    Interm History: Patient sleeping in room. Easily arousable. No acute complaints. Sat patient up in bed for breakfast.  OBJECTIVE:  Vital Signs: Vitals:   01/03/21 1520 01/03/21 2007 01/04/21 0000 01/04/21 0400  BP: (!) 147/88 (!) 163/88 (!) 131/92 (!) 140/100  Pulse: 81 87 74 95  Resp: 18 18    Temp: 98 F (36.7 C) 98.1 F (36.7 C) 98.7 F (37.1 C) 98.7 F (37.1 C)  TempSrc: Oral Oral Oral Oral  SpO2: 97% 99% 97% 97%  Weight:      Height:       Supplemental O2: Room Air SpO2: 97 % O2 Flow Rate (L/min): 2 L/min  Filed Weights   12/24/20 0900 01/02/21 1935  Weight: 75 kg 76.1 kg     Intake/Output Summary (Last 24 hours) at 01/04/2021 1607 Last data filed at 01/03/2021 1446 Gross per 24 hour  Intake 360 ml  Output 400 ml  Net -40 ml    Net IO Since Admission: 1,371.43 mL [01/04/21 0605]  Physical Exam:  Physical Exam Constitutional:      General: He is not in acute distress.    Appearance: He is normal weight. He is not ill-appearing, toxic-appearing or diaphoretic.     Comments: Oriented to person, not place or time, short term memory impaired.  HENT:     Head: Atraumatic.     Mouth/Throat:     Mouth: Mucous membranes are moist.     Pharynx: Oropharynx is clear.  Eyes:     Pupils: Pupils are equal, round, and reactive to light.  Cardiovascular:     Rate and Rhythm: Normal rate and regular rhythm.     Heart sounds: Normal heart sounds.  Pulmonary:     Effort: No respiratory distress.     Breath sounds: Normal breath sounds.     Comments: Patient does not appear to be in respiratory distress, however he is taking shallow breaths with occasional apneic spells.  Saturating 94% 2  L Abdominal:     General: Abdomen is flat. There is no distension.     Palpations: Abdomen is soft.     Tenderness: There is no abdominal tenderness.  Musculoskeletal:        General: Normal range of motion.     Cervical back: Normal range of motion and neck supple.  Skin:    General: Skin is warm and dry.  Neurological:     General: No focal deficit present.     Mental Status: He is alert.  Psychiatric:     Comments: =    Pertinent Labs: CBC Latest Ref Rng & Units 01/04/2021 01/03/2021 01/02/2021  WBC 4.0 - 10.5 K/uL 10.4 11.1(H) 10.5  Hemoglobin 13.0 - 17.0 g/dL 13.2 13.3 12.5(L)  Hematocrit 39.0 - 52.0 % 38.7(L) 39.9 37.3(L)  Platelets 150 - 400 K/uL 247 233 202    CMP Latest Ref Rng & Units 01/04/2021 01/03/2021 01/03/2021  Glucose 70 - 99 mg/dL 102(H) - 97  BUN 8 - 23 mg/dL 11 - 13  Creatinine 0.61 - 1.24 mg/dL 0.89 - 0.91  Sodium 135 - 145 mmol/L 139 - 137  Potassium 3.5 - 5.1 mmol/L 4.0 4.5 5.5(H)  Chloride 98 - 111 mmol/L 105 - 105  CO2  22 - 32 mmol/L 26 - 20(L)  Calcium 8.9 - 10.3 mg/dL 8.6(L) - 8.3(L)  Total Protein 6.5 - 8.1 g/dL - - -  Total Bilirubin 0.3 - 1.2 mg/dL - - -  Alkaline Phos 38 - 126 U/L - - -  AST 15 - 41 U/L - - -  ALT 0 - 44 U/L - - -    Recent Labs    01/03/21 2009 01/04/21 0047 01/04/21 0426  GLUCAP 129* 112* 103*      ASSESSMENT/PLAN:   Assessment:  Ralph West is a 87 y.o. with pertinent PMH of Alzheimer's, hypertension, hyperlipidemia admitted for new onset seizures and is on hospital day 10.  Mental status has improved and pneumonia resolved. Pending SNF placement.   Plan:  #Hospital Delirium: waxing and waning mental status with sundowning.  When his underlying dementia is at risk for delirium.  No acute overnight events did restart trazodone which he takes at home as well did not require sitter overnight. - wife to bring glasses, patient no longer uses hearing aides -We will attempt to get chair and encourage out of bed into  his room - trazadone 25mg  at night - Risperidone 0.25mg  as needed for severe agitation or safety concerns  #Seizure Disorder:  new diagnosis with witnessed seizure activity. Hospital course complicated by prolonged post-ictal state and then hospital delirium. Most likely cause of the seizures is his dementia.  -Vimpat 100 mg twice daily -2 mg IV Ativan as needed for any seizures currently 5 minutes. -Aricept 10mg  daily -Patient will need outpatient neurology follow-up  #Aspiration pneumonia, resolved Completed course of antibiotics.  Was noted to have increased coughing with swallowing thin liquids.  He will be evaluated yesterday and recommended dysphagia 3 diet with nectar thick liquids with supervision with meals. - he is deconditioned, will need continued PT and d/c to SNF, family prefers [facility]  #Focal Atrial Tachycardia - resolved, patient has not had any tachycardia for several days. Telemetry has been discontinued to help promote resolution of delirium.  - If Focal AT episode is asymptomatic and hemodynamically stable, it can be observed - continue PO metoprolol 25mg  q6 hours  #Spiculated opacity left upper lobe Remote history of smoking Seen on CT angio chest This can likely be worked up as an outpatient with Noncon CT in 3 months.  Hyperlipidemia  continue statin  Hypertension Continue metoprolol.  Best Practice: IVF: Fluids: LR,  VTE: enoxaparin (LOVENOX) injection 40 mg Start: 12/26/20 1445 Code: Full Family Contact: wife, at bedside. Anticipate discharge approximately 1 to 2 days  Signature: Ralph Beard, MD Internal Medicine Resident, PGY-2 [facility]  Pager: [phone] 6:05 AM, 01/04/2021   Please contact the on call pager after 5 pm and on weekends at [phone].

## 2021-01-05 NOTE — Progress Notes (Signed)
HD#11 SUBJECTIVE:  Patient Summary: Ralph West is a 87 y.o. with a pertinent PMH of diabetes hyperlipidemia hypertension Alzheimer's MI, who presented with seizure-like activity and admitted for acute metabolic encephalopathy.   Overnight Events: No acute overnight events    Interm History: Patient seen and evaluated at bedside, easily arouses. Resting calmly in bed. No complaints, denies pain. No complaints at this time.   OBJECTIVE:  Vital Signs: Vitals:   01/04/21 2012 01/05/21 0504 01/05/21 0824 01/05/21 1238  BP: 140/82 137/67 (!) 107/59 133/74  Pulse: 86 75 78 73  Resp: 20 20 18 20   Temp: 98.6 F (37 C) 97.9 F (36.6 C) 98.1 F (36.7 C) 98.3 F (36.8 C)  TempSrc: Oral Axillary Oral Oral  SpO2: 97% 94% 94% 96%  Weight:      Height:       Supplemental O2: Room Air SpO2: 96 % O2 Flow Rate (L/min): 2 L/min  Filed Weights   12/24/20 0900 01/02/21 1935  Weight: 75 kg 76.1 kg     Intake/Output Summary (Last 24 hours) at 01/05/2021 1307 Last data filed at 01/05/2021 4970 Gross per 24 hour  Intake 193.49 ml  Output 950 ml  Net -756.51 ml   Net IO Since Admission: 614.92 mL [01/05/21 1307]  Physical Exam:  Physical Exam Constitutional:      General: He is not in acute distress.    Appearance: He is normal weight. He is not ill-appearing, toxic-appearing or diaphoretic.  HENT:     Head: Atraumatic.     Mouth/Throat:     Mouth: Mucous membranes are moist.     Pharynx: Oropharynx is clear.  Eyes:     Pupils: Pupils are equal, round, and reactive to light.  Cardiovascular:     Rate and Rhythm: Normal rate and regular rhythm.     Heart sounds: Normal heart sounds.  Pulmonary:     Effort: No respiratory distress.     Breath sounds: Normal breath sounds.  Abdominal:     General: Abdomen is flat. There is no distension.     Palpations: Abdomen is soft.     Tenderness: There is no abdominal tenderness.  Musculoskeletal:        General: Normal range of  motion.     Cervical back: Normal range of motion and neck supple.  Skin:    General: Skin is warm and dry.  Neurological:     General: No focal deficit present.     Mental Status: He is alert.  Psychiatric:        Mood and Affect: Mood normal.        Behavior: Behavior normal.    Pertinent Labs: CBC Latest Ref Rng & Units 01/04/2021 01/03/2021 01/02/2021  WBC 4.0 - 10.5 K/uL 10.4 11.1(H) 10.5  Hemoglobin 13.0 - 17.0 g/dL 13.2 13.3 12.5(L)  Hematocrit 39.0 - 52.0 % 38.7(L) 39.9 37.3(L)  Platelets 150 - 400 K/uL 247 233 202    CMP Latest Ref Rng & Units 01/04/2021 01/03/2021 01/03/2021  Glucose 70 - 99 mg/dL 102(H) - 97  BUN 8 - 23 mg/dL 11 - 13  Creatinine 0.61 - 1.24 mg/dL 0.89 - 0.91  Sodium 135 - 145 mmol/L 139 - 137  Potassium 3.5 - 5.1 mmol/L 4.0 4.5 5.5(H)  Chloride 98 - 111 mmol/L 105 - 105  CO2 22 - 32 mmol/L 26 - 20(L)  Calcium 8.9 - 10.3 mg/dL 8.6(L) - 8.3(L)  Total Protein 6.5 - 8.1 g/dL - - -  Total Bilirubin 0.3 - 1.2 mg/dL - - -  Alkaline Phos 38 - 126 U/L - - -  AST 15 - 41 U/L - - -  ALT 0 - 44 U/L - - -    Recent Labs    01/04/21 0802 01/04/21 1145 01/04/21 1741  GLUCAP 88 140* 126*     ASSESSMENT/PLAN:   Assessment:  Ralph West is a 87 y.o. with pertinent PMH of Alzheimer's, hypertension, hyperlipidemia admitted for new onset seizures and is on hospital day 11.  Mental status has improved and pneumonia resolved. He was started on trazodone nightly with with improvement. Pending SNF placement.   Plan:  #Hospital Delirium: waxing and waning mental status with sundowning.  Given underlying dementia, he is at risk for delirium. He has been restarted on trazodone which is a home medication for him. Since starting on trazodone, he has not required a sitter or restraints.  - wife to bring glasses, patient no longer uses hearing aides -We will attempt to get chair and encourage out of bed into his room - trazadone 25mg  at night - Risperidone 0.25mg  as  needed for severe agitation or safety concerns - stable for discharge, pending SNF.  #Seizure Disorder:  new diagnosis with witnessed seizure activity. Hospital course complicated by prolonged post-ictal state and then hospital delirium. Most likely cause of the seizures is his dementia.  -Vimpat 100 mg twice daily -2 mg IV Ativan as needed for any seizures currently 5 minutes. -Aricept 10mg  daily -Patient will need outpatient neurology follow-up  #Aspiration pneumonia, resolved Completed course of antibiotics.  Was noted to have increased coughing with swallowing thin liquids.  He will be evaluated yesterday and recommended dysphagia 3 diet with nectar thick liquids with supervision with meals. - he is deconditioned, will need continued PT and d/c to SNF, family prefers [facility]  #Focal Atrial Tachycardia - resolved, patient has not had any tachycardia for several days. Telemetry has been discontinued to help promote resolution of delirium.  - If Focal AT episode is asymptomatic and hemodynamically stable, it can be observed - continue PO metoprolol 25mg  q6 hours  #Spiculated opacity left upper lobe Remote history of smoking Seen on CT angio chest This can likely be worked up as an outpatient with Noncon CT in 3 months.  Hyperlipidemia  continue statin  Hypertension Continue metoprolol.  Best Practice: IVF: Fluids: LR,  VTE: enoxaparin (LOVENOX) injection 40 mg Start: 12/26/20 1445 Code: Full Family Contact: wife, at bedside. Anticipate discharge approximately 1 to 2 days  Signature: Delene Ruffini, MD Internal Medicine Resident, PGY-1 [facility]  Pager: [phone] 1:07 PM, 01/05/2021   Please contact the on call pager after 5 pm and on weekends at [phone].

## 2021-01-06 DIAGNOSIS — F918 Other conduct disorders: Secondary | ICD-10-CM | POA: Diagnosis not present

## 2021-01-06 DIAGNOSIS — R1312 Dysphagia, oropharyngeal phase: Secondary | ICD-10-CM | POA: Diagnosis not present

## 2021-01-06 DIAGNOSIS — Z6827 Body mass index (BMI) 27.0-27.9, adult: Secondary | ICD-10-CM | POA: Diagnosis not present

## 2021-01-06 DIAGNOSIS — R2689 Other abnormalities of gait and mobility: Secondary | ICD-10-CM | POA: Diagnosis not present

## 2021-01-06 DIAGNOSIS — R269 Unspecified abnormalities of gait and mobility: Secondary | ICD-10-CM | POA: Diagnosis not present

## 2021-01-06 DIAGNOSIS — I209 Angina pectoris, unspecified: Secondary | ICD-10-CM | POA: Diagnosis not present

## 2021-01-06 DIAGNOSIS — R404 Transient alteration of awareness: Secondary | ICD-10-CM | POA: Diagnosis not present

## 2021-01-06 DIAGNOSIS — R54 Age-related physical debility: Secondary | ICD-10-CM | POA: Diagnosis not present

## 2021-01-06 DIAGNOSIS — I471 Supraventricular tachycardia: Secondary | ICD-10-CM | POA: Diagnosis not present

## 2021-01-06 DIAGNOSIS — E119 Type 2 diabetes mellitus without complications: Secondary | ICD-10-CM | POA: Diagnosis not present

## 2021-01-06 DIAGNOSIS — I739 Peripheral vascular disease, unspecified: Secondary | ICD-10-CM | POA: Diagnosis not present

## 2021-01-06 DIAGNOSIS — G4089 Other seizures: Secondary | ICD-10-CM | POA: Diagnosis not present

## 2021-01-06 DIAGNOSIS — R2681 Unsteadiness on feet: Secondary | ICD-10-CM | POA: Diagnosis not present

## 2021-01-06 DIAGNOSIS — M810 Age-related osteoporosis without current pathological fracture: Secondary | ICD-10-CM | POA: Diagnosis not present

## 2021-01-06 DIAGNOSIS — L24A0 Irritant contact dermatitis due to friction or contact with body fluids, unspecified: Secondary | ICD-10-CM | POA: Diagnosis not present

## 2021-01-06 DIAGNOSIS — F03B18 Unspecified dementia, moderate, with other behavioral disturbance: Secondary | ICD-10-CM | POA: Diagnosis not present

## 2021-01-06 DIAGNOSIS — R456 Violent behavior: Secondary | ICD-10-CM | POA: Diagnosis not present

## 2021-01-06 DIAGNOSIS — I10 Essential (primary) hypertension: Secondary | ICD-10-CM | POA: Diagnosis not present

## 2021-01-06 DIAGNOSIS — Z7401 Bed confinement status: Secondary | ICD-10-CM | POA: Diagnosis not present

## 2021-01-06 DIAGNOSIS — G9340 Encephalopathy, unspecified: Secondary | ICD-10-CM | POA: Diagnosis not present

## 2021-01-06 DIAGNOSIS — F419 Anxiety disorder, unspecified: Secondary | ICD-10-CM | POA: Diagnosis not present

## 2021-01-06 DIAGNOSIS — E663 Overweight: Secondary | ICD-10-CM | POA: Diagnosis not present

## 2021-01-06 DIAGNOSIS — I2510 Atherosclerotic heart disease of native coronary artery without angina pectoris: Secondary | ICD-10-CM | POA: Diagnosis not present

## 2021-01-06 DIAGNOSIS — R569 Unspecified convulsions: Secondary | ICD-10-CM | POA: Diagnosis not present

## 2021-01-06 DIAGNOSIS — R41841 Cognitive communication deficit: Secondary | ICD-10-CM | POA: Diagnosis not present

## 2021-01-06 DIAGNOSIS — F339 Major depressive disorder, recurrent, unspecified: Secondary | ICD-10-CM | POA: Diagnosis not present

## 2021-01-06 DIAGNOSIS — E785 Hyperlipidemia, unspecified: Secondary | ICD-10-CM | POA: Diagnosis not present

## 2021-01-06 DIAGNOSIS — I959 Hypotension, unspecified: Secondary | ICD-10-CM | POA: Diagnosis not present

## 2021-01-06 DIAGNOSIS — G309 Alzheimer's disease, unspecified: Secondary | ICD-10-CM | POA: Diagnosis not present

## 2021-01-06 DIAGNOSIS — G40009 Localization-related (focal) (partial) idiopathic epilepsy and epileptic syndromes with seizures of localized onset, not intractable, without status epilepticus: Secondary | ICD-10-CM | POA: Diagnosis not present

## 2021-01-06 DIAGNOSIS — M6281 Muscle weakness (generalized): Secondary | ICD-10-CM | POA: Diagnosis not present

## 2021-01-06 LAB — RESP PANEL BY RT-PCR (FLU A&B, COVID) ARPGX2
Influenza A by PCR: NEGATIVE
Influenza B by PCR: NEGATIVE
SARS Coronavirus 2 by RT PCR: NEGATIVE

## 2021-01-06 MED ORDER — LACOSAMIDE 200 MG/20ML IV SOLN
100.0000 mg | Freq: Two times a day (BID) | INTRAVENOUS | 0 refills | Status: DC
Start: 2021-01-06 — End: 2021-01-06

## 2021-01-06 MED ORDER — RISPERIDONE 0.25 MG PO TBDP: 0.2500 mg | ORAL_TABLET | ORAL | 0 refills | Status: DC | PRN

## 2021-01-06 MED ORDER — LACOSAMIDE 100 MG PO TABS: 100.0000 mg | ORAL_TABLET | Freq: Two times a day (BID) | ORAL | 0 refills | Status: DC

## 2021-01-06 MED ORDER — TRAZODONE HCL 50 MG PO TABS
25.0000 mg | ORAL_TABLET | Freq: Every day | ORAL | 0 refills | Status: DC
Start: 2021-01-06 — End: 2022-04-04

## 2021-01-06 NOTE — Discharge Instructions (Addendum)
Dear Ralph West,  We treated you in the hospital for a seizure and pneumonia. The seizure is likely related to your underlying dementia. We have started you on a medication called Lacosamide which has been controlling your seizures. We also continued your Aricept that you had been taking as an outpatient. Additionally, throughout your hospitalization, you periodically became delirious at night. We started you on Trazodone 25mg  nightly, and also gave you a medication called Risperidone to use as needed to help with your delirium. We recommend that you continue taking the 25mg  of Trazodone nightly. Please continue taking your other medications as previously prescribed. We also recommend that you follow up with a neurologist as an outpatient. Please call their office at [phone] to schedule an appointment in 1-2 weeks. We also recommend scheduling an appointment with your primary care physician as an outpatient for a hospital follow up in 1-2 weeks.

## 2021-01-06 NOTE — Progress Notes (Incomplete)
HD#12 SUBJECTIVE:  Patient Summary: Ralph West is a 87 y.o. with a pertinent PMH of diabetes hyperlipidemia hypertension Alzheimer's MI, who presented with seizure-like activity and admitted for acute metabolic encephalopathy.   Overnight Events: No acute overnight events    Interm History: Patient seen and evaluated at bedside, easily arouses. Resting calmly in bed. No complaints, denies pain. No complaints at this time.   OBJECTIVE:  Vital Signs: Vitals:   01/05/21 0504 01/05/21 0824 01/05/21 1238 01/06/21 0336  BP: 137/67 (!) 107/59 133/74 127/76  Pulse: 75 78 73 90  Resp: 20 18 20 18   Temp: 97.9 F (36.6 C) 98.1 F (36.7 C) 98.3 F (36.8 C) 98.2 F (36.8 C)  TempSrc: Axillary Oral Oral Oral  SpO2: 94% 94% 96% 97%  Weight:      Height:       Supplemental O2: Room Air SpO2: 97 % O2 Flow Rate (L/min): 2 L/min  Filed Weights   12/24/20 0900 01/02/21 1935  Weight: 75 kg 76.1 kg     Intake/Output Summary (Last 24 hours) at 01/06/2021 0759 Last data filed at 01/06/2021 0431 Gross per 24 hour  Intake 157.51 ml  Output 400 ml  Net -242.49 ml    Net IO Since Admission: 372.43 mL [01/06/21 0759]  Physical Exam:  Physical Exam Constitutional:      General: He is not in acute distress.    Appearance: He is normal weight. He is not ill-appearing, toxic-appearing or diaphoretic.  HENT:     Head: Atraumatic.     Mouth/Throat:     Mouth: Mucous membranes are moist.     Pharynx: Oropharynx is clear.  Eyes:     Pupils: Pupils are equal, round, and reactive to light.  Cardiovascular:     Rate and Rhythm: Normal rate and regular rhythm.     Heart sounds: Normal heart sounds.  Pulmonary:     Effort: No respiratory distress.     Breath sounds: Normal breath sounds.  Abdominal:     General: Abdomen is flat. There is no distension.     Palpations: Abdomen is soft.     Tenderness: There is no abdominal tenderness.  Musculoskeletal:        General: Normal range  of motion.     Cervical back: Normal range of motion and neck supple.  Skin:    General: Skin is warm and dry.  Neurological:     General: No focal deficit present.     Mental Status: He is alert.  Psychiatric:        Mood and Affect: Mood normal.        Behavior: Behavior normal.    Pertinent Labs: CBC Latest Ref Rng & Units 01/04/2021 01/03/2021 01/02/2021  WBC 4.0 - 10.5 K/uL 10.4 11.1(H) 10.5  Hemoglobin 13.0 - 17.0 g/dL 13.2 13.3 12.5(L)  Hematocrit 39.0 - 52.0 % 38.7(L) 39.9 37.3(L)  Platelets 150 - 400 K/uL 247 233 202    CMP Latest Ref Rng & Units 01/04/2021 01/03/2021 01/03/2021  Glucose 70 - 99 mg/dL 102(H) - 97  BUN 8 - 23 mg/dL 11 - 13  Creatinine 0.61 - 1.24 mg/dL 0.89 - 0.91  Sodium 135 - 145 mmol/L 139 - 137  Potassium 3.5 - 5.1 mmol/L 4.0 4.5 5.5(H)  Chloride 98 - 111 mmol/L 105 - 105  CO2 22 - 32 mmol/L 26 - 20(L)  Calcium 8.9 - 10.3 mg/dL 8.6(L) - 8.3(L)  Total Protein 6.5 - 8.1 g/dL - - -  Total Bilirubin 0.3 - 1.2 mg/dL - - -  Alkaline Phos 38 - 126 U/L - - -  AST 15 - 41 U/L - - -  ALT 0 - 44 U/L - - -    Recent Labs    01/04/21 0802 01/04/21 1145 01/04/21 1741  GLUCAP 88 140* 126*      ASSESSMENT/PLAN:   Assessment:  Ralph West is a 87 y.o. with pertinent PMH of Alzheimer's, hypertension, hyperlipidemia admitted for new onset seizures and is on hospital day 12.  Mental status has improved and pneumonia resolved. He was started on trazodone nightly with with improvement. Pending SNF placement.   Plan:  #Hospital Delirium: waxing and waning mental status with sundowning.  Given underlying dementia, he is at risk for delirium. He has been restarted on trazodone which is a home medication for him. Since starting on trazodone, he has not required a sitter or restraints.  - wife to bring glasses, patient no longer uses hearing aides -We will attempt to get chair and encourage out of bed into his room - trazadone 25mg  at night - Risperidone 0.25mg   as needed for severe agitation or safety concerns - stable for discharge, pending SNF.  #Seizure Disorder:  new diagnosis with witnessed seizure activity. Hospital course complicated by prolonged post-ictal state and then hospital delirium. Most likely cause of the seizures is his dementia.  -Vimpat 100 mg twice daily -2 mg IV Ativan as needed for any seizures currently 5 minutes. -Aricept 10mg  daily -Patient will need outpatient neurology follow-up  #Aspiration pneumonia, resolved Completed course of antibiotics.  Was noted to have increased coughing with swallowing thin liquids.  He will be evaluated yesterday and recommended dysphagia 3 diet with nectar thick liquids with supervision with meals. - he is deconditioned, will need continued PT and d/c to SNF, family prefers [facility]  #Focal Atrial Tachycardia - resolved, patient has not had any tachycardia for several days. Telemetry has been discontinued to help promote resolution of delirium.  - If Focal AT episode is asymptomatic and hemodynamically stable, it can be observed - continue PO metoprolol 25mg  q6 hours  #Spiculated opacity left upper lobe Remote history of smoking Seen on CT angio chest This can likely be worked up as an outpatient with Noncon CT in 3 months.  Hyperlipidemia  continue statin  Hypertension Continue metoprolol.  Best Practice: IVF: Fluids: LR,  VTE: enoxaparin (LOVENOX) injection 40 mg Start: 12/26/20 1445 Code: Full Family Contact: wife, at bedside. Anticipate discharge approximately 1 to 2 days  Signature: Delene Ruffini, MD Internal Medicine Resident, PGY-1 [facility]  Pager: [phone] 7:59 AM, 01/06/2021   Please contact the on call pager after 5 pm and on weekends at [phone].

## 2021-01-06 NOTE — TOC Transition Note (Signed)
Transition of Care [facility]) - CM/SW Discharge Note   Patient Details  Name: Ralph West MRN: [id number] Date of Birth: Dec 15, 1932  Transition of [facility]) CM/SW Contact:  Coralee Pesa, [place] Phone Number: 01/06/2021, 12:26 PM   Clinical Narrative:     Pt to be transported to [place] via [facility] at 5 pm, Pending negative covid test. Nurse to call report to [phone] ext [phone]. Family and facility updated, TOC to sign off at this time.  Final next level of care: [place] Barriers to Discharge: Barriers Resolved   Patient Goals and CMS Choice     Choice offered to / list presented to : Spouse  Discharge Placement              Patient chooses bed at: [facility], [place] Patient to be transferred to facility by: [facility] Name of family member notified: Peggy Patient and family notified of of transfer: 01/06/21  Discharge Plan and Services   Discharge Planning Services: CM Consult Post Acute Care Choice: [place]                               Social Determinants of Health (SDOH) Interventions     Readmission Risk Interventions No flowsheet data found.

## 2021-01-06 NOTE — Progress Notes (Signed)
SLP Cancellation Note  Patient Details Name: Kushal Saunders MRN: [id number] DOB: 1932-10-10   Cancelled treatment:       Reason Eval/Treat Not Completed: Patient at procedure or test/unavailable (Pt being cleaned by staff. SLP will follow up later as schedule allows.)  Kue Fox I. Hardin Negus, [place], [place] Office number [phone] Pager [phone] 01/06/2021, 1:25 PM

## 2021-01-06 NOTE — Discharge Summary (Addendum)
Name: Ralph West MRN: [id number] DOB: [birth year] 87 y.o. PCP: Townsend Roger, MD  Date of Admission: 12/24/2020  9:35 AM Date of Discharge: 01/06/21 Attending Physician: Dr. Evette Doffing  Discharge Diagnosis: Principal Problem:   Seizures ([place]) Active Problems:   Diabetes mellitus without complication ([place])   Acute metabolic encephalopathy   Dementia ([place])   Aspiration pneumonia ([place])   Focal atrial tachycardia ([place])    Discharge Medications: Allergies as of 01/06/2021   No Known Allergies      Medication List     STOP taking these medications    saw palmetto 160 MG capsule       TAKE these medications    aspirin EC 81 MG tablet Take 81 mg by mouth daily.   donepezil 10 MG tablet Commonly known as: ARICEPT Take 10 mg by mouth daily.   Lacosamide 100 MG Tabs Take 1 tablet (100 mg total) by mouth 2 (two) times daily.   metoprolol tartrate 25 MG tablet Commonly known as: LOPRESSOR Take 25 mg by mouth 2 (two) times daily.   multivitamin-iron-minerals-folic acid chewable tablet Chew 1 tablet by mouth daily.   nitroGLYCERIN 0.4 MG SL tablet Commonly known as: NITROSTAT Place 1 tablet (0.4 mg total) under the tongue every 5 (five) minutes as needed.   OYSTER SHELL CALCIUM/D PO Take 1 tablet by mouth daily.   Risperidone 0.25 MG Tbdp Take 1 tablet (0.25 mg total) by mouth as needed (as needed for agitation/confusion).   rosuvastatin 5 MG tablet Commonly known as: CRESTOR Take 5 mg by mouth daily.   sertraline 25 MG tablet Commonly known as: ZOLOFT Take 25 mg by mouth daily.   traZODone 50 MG tablet Commonly known as: DESYREL Take 0.5 tablets (25 mg total) by mouth at bedtime. What changed: how much to take               Discharge Care Instructions  (From admission, onward)           Start     Ordered   01/06/21 0000  Change dressing (specify)       Comments: Dressing change: keeping wound clean dry and intact   01/06/21 1323             Disposition and follow-up:   Ralph West was discharged from [facility] in Stable condition.  At the hospital follow up visit please address:  1.  Follow-up:  a. Seizure disorder - patient on donepezil and started on lacosamide. He will need neurology outpatient follow up    b. Aspiration pneumonia - hx of dementia, required nectar thick liquids. Ensure no further aspiration   c. Focal atrial tachycardia - on metoprolol 25mg  q6   d. Spiculated opacity with hx of smoking - recommended Noncon CT in 3 months.  2.  Labs / imaging needed at time of follow-up: cbc, cmp  3.  Pending labs/ test needing follow-up: none  4.  Medication Changes  Started: lacosamide, risperidone  Stopped: none  Changed: trazodone  Abx -  none End Date: none  Follow-up Appointments:  Contact information for follow-up providers     [facility]. Schedule an appointment as soon as possible for a visit in 1 week(s).   Contact information: [place], [place] [phone]        Nona Dell, Corene Cornea, MD. Schedule an appointment as soon as possible for a visit in 1 week(s).   Specialty: Internal Medicine Contact information:  [address] [place] [address] [phone]              Contact information for after-discharge care     Destination     [facility] .   Service: Skilled Nursing Contact information: [place] Hospital Course by problem list:   #Acute encephalopathy # Alzheimer's Dementia #Seizure - patient was evaluated in the hospital for aphasia. Initially evaluated in the ED for stroke, however while in the ED, patient had witness seizure activity with prolonged post-ictal state. He was initially started on Keppra, but this was discontinued due to agitation and was continued on Vimpat  instead. No further seizures were noted while in the hospital. Additionally, patient was noted to have likely aspiration pneumonia, which was felt to be contributing to his altered mental status. He was treated with a course of antibiotics with improvement in mental status. However, his hospital course was complicated by hospital delirium. He was started on 25mg  Trazodone nightly and  0.25mg  of Risperidone as needed with adequate control of agitation.  # Aspiration pneumonia -  Patient was noted to have tachycardia and elevated respiratory rate during his hospitalization. He was evaluated with a CT angiogram for possible PE. CTA indicated possible aspiration pneumonia. He was treated with a 5 day course of Unasyn with improvement in his O2 sats, and was able to be weaned off supplemental oxygen. Mental status improved as well. Due to concern for aspiration, SLP was consulted and recommended a dysphagia 3 diet with nectar thick liquids.   Focal atrial tachycardia - Patient was noted to have elevated heart rates into the 130's periodically. Initial concern for PE given tachypnea and O2 requirement as well. CTA was ordered and negative for PE. On telemetry, patient was noted to have paroxysmal episodes of tachycardia with inverted P waves not related to agitation or exertion. Felt to be focal atrial tachycardia. His vital signs were monitored throughout hospitalization and remained stable. He was continued on PO metoprolol. No recurrence of tachycardia was noted for several days and telmetry was discontinued.    Discharge Subjective: Patient seen and evaluated at bedside. No complaints. States he is tired, but otherwise doing well.   Discharge Exam:   BP 120/65 (BP Location: Right Arm)   Pulse 75   Temp 97.6 F (36.4 C) (Oral)   Resp 16   Ht 5\' 5"  (1.651 m)   Wt 76.1 kg   SpO2 98%   BMI 27.92 kg/m  Constitutional:      General: He is not in acute distress.    Appearance: He is normal weight. He  is not ill-appearing, toxic-appearing or diaphoretic.  HENT:     Head: Atraumatic.     Mouth/Throat:     Mouth: Mucous membranes are moist.     Pharynx: Oropharynx is clear.  Eyes:     Pupils: Pupils are equal, round, and reactive to light.  Cardiovascular:     Rate and Rhythm: Normal rate and regular rhythm.     Heart sounds: Normal heart sounds.  Pulmonary:     Effort: No respiratory distress.     Breath sounds: Normal breath sounds.  Abdominal:     General: Abdomen is flat. There is no distension.     Palpations: Abdomen is soft.     Tenderness: There  is no abdominal tenderness.  Musculoskeletal:        General: Normal range of motion.     Cervical back: Normal range of motion and neck supple.  Skin:    General: Skin is warm and dry.  Neurological:     General: No focal deficit present.     Mental Status: He is alert.  Psychiatric:        Mood and Affect: Mood normal.        Behavior: Behavior normal.  Pertinent Labs, Studies, and Procedures:  CBC Latest Ref Rng & Units 01/04/2021 01/03/2021 01/02/2021  WBC 4.0 - 10.5 K/uL 10.4 11.1(H) 10.5  Hemoglobin 13.0 - 17.0 g/dL 13.2 13.3 12.5(L)  Hematocrit 39.0 - 52.0 % 38.7(L) 39.9 37.3(L)  Platelets 150 - 400 K/uL 247 233 202    CMP Latest Ref Rng & Units 01/04/2021 01/03/2021 01/03/2021  Glucose 70 - 99 mg/dL 102(H) - 97  BUN 8 - 23 mg/dL 11 - 13  Creatinine 0.61 - 1.24 mg/dL 0.89 - 0.91  Sodium 135 - 145 mmol/L 139 - 137  Potassium 3.5 - 5.1 mmol/L 4.0 4.5 5.5(H)  Chloride 98 - 111 mmol/L 105 - 105  CO2 22 - 32 mmol/L 26 - 20(L)  Calcium 8.9 - 10.3 mg/dL 8.6(L) - 8.3(L)  Total Protein 6.5 - 8.1 g/dL - - -  Total Bilirubin 0.3 - 1.2 mg/dL - - -  Alkaline Phos 38 - 126 U/L - - -  AST 15 - 41 U/L - - -  ALT 0 - 44 U/L - - -    CT Head Wo Contrast  Result Date: 12/24/2020 CLINICAL DATA:  Neurologic deficit. EXAM: CT HEAD WITHOUT CONTRAST TECHNIQUE: Contiguous axial images were obtained from the base of the skull  through the vertex without intravenous contrast. COMPARISON:  CT head dated December 24, 2020 FINDINGS: Brain: Chronic white matter ischemic change. 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 No evidence of acute infarction, hemorrhage, hydrocephalus, extra-axial collection or mass lesion/mass effect. Vascular: Diffuse vascular hyperdensity is likely related to prior CTA. Skull: Normal. Negative for fracture or focal lesion. Sinuses/Orbits: No acute finding. Other: None. IMPRESSION: No acute intracranial abnormality. Electronically Signed   By: Yetta Glassman M.D.   On: 12/24/2020 12:22   MR BRAIN WO CONTRAST  Result Date: 12/25/2020 CLINICAL DATA:  Encephalopathy EXAM: MRI HEAD WITHOUT CONTRAST TECHNIQUE: Multiplanar, multiecho pulse sequences of the brain and surrounding structures were obtained without intravenous contrast. COMPARISON:  11/08/2017 FINDINGS: Brain: No acute infarct, mass effect or extra-axial collection. Single focus of left parietal periventricular chronic microhemorrhage. There is multifocal hyperintense T2-weighted signal within the white matter. Generalized volume loss without a clear lobar predilection. The midline structures are normal. Unchanged left posterior fossa meningioma. Vascular: Chronic abnormal right vertebral artery flow void. Skull and upper cervical spine: Normal calvarium and skull base. Visualized upper cervical spine and soft tissues are normal. Sinuses/Orbits:No paranasal sinus fluid levels or advanced mucosal thickening. No mastoid or middle ear effusion. Normal orbits. IMPRESSION: 1. No acute intracranial abnormality. 2. Generalized volume loss and findings of chronic microvascular ischemia. Electronically Signed   By: Ulyses Jarred M.D.   On: 12/25/2020 03:25   DG CHEST PORT 1 VIEW  Result Date: 12/24/2020 CLINICAL DATA:  Dyspnea. EXAM: PORTABLE CHEST 1 VIEW COMPARISON:  Chest radiograph 06/11/2019. FINDINGS: Heart size within normal limits. Aortic  atherosclerosis. Ill-defined opacity within the left lung base. No appreciable airspace consolidation within the right lung. No evidence of pleural effusion  or pneumothorax. No acute bony abnormality identified. Degenerative changes of the spine. IMPRESSION: Ill-defined opacity within the left lung base, which may reflect atelectasis or aspiration/pneumonia. Aortic Atherosclerosis (ICD10-I70.0). Electronically Signed   By: Kellie Simmering D.O.   On: 12/24/2020 15:57   EEG adult  Result Date: 12/24/2020 Lora Havens, MD     12/24/2020  1:35 PM Patient Name: Jerardo Costabile MRN: [id number] Epilepsy Attending: Lora Havens Referring Physician/Provider: Dr Kerney Elbe Date: 12/24/2020 Duration: 30.50 mins Patient history: 87 year old male presented with acute onset of agitation, confusion and aphasia.  EEG to evaluate for seizure. Level of alertness: Awake, asleep AEDs during EEG study: Keppra, Ativan Technical aspects: This EEG study was done with scalp electrodes positioned according to the 10-20 International system of electrode placement. Electrical activity was acquired at a sampling rate of 500Hz  and reviewed with a high frequency filter of 70Hz  and a low frequency filter of 1Hz . EEG data were recorded continuously and digitally stored. Description: During awake state, no clear posterior dominant rhythm was seen.  Sleep was characterized by vertex waves, sleep spindles (12 to 14 Hz), maximal frontocentral region.  EEG also showed continuous generalized 5 to 6 Hz theta slowing as well as intermittent sharply contoured 2 to 3 Hz delta slowing in left frontotemporal region.  Hyperventilation and photic stimulation were not performed.   ABNORMALITY - Continuous slow, generalized and maximal left frontotemporal region IMPRESSION: This study is suggestive of nonspecific cortical dysfunction in the frontotemporal region.  Additionally there is moderate diffuse encephalopathy, nonspecific etiology.  No seizures or  definite epileptiform discharges were seen throughout the recording. Lora Havens   CT HEAD CODE STROKE WO CONTRAST  Result Date: 12/24/2020 CLINICAL DATA:  Neuro deficit, acute, stroke suspected EXAM: CT HEAD WITHOUT CONTRAST CT ANGIOGRAPHY HEAD AND NECK CT PERFUSION BRAIN TECHNIQUE: Multi detector CT imaging of the head was performed using standard protocol without contrast. Multidetector CT imaging of the head and neck was performed using the standard protocol during bolus administration of intravenous contrast. Multiplanar CT image reconstructions and MIPs were obtained to evaluate the vascular anatomy. Carotid stenosis measurements (when applicable) are obtained utilizing NASCET criteria, using the distal internal carotid diameter as the denominator. Multiphase CT imaging of the brain was performed following IV bolus contrast injection. Subsequent parametric perfusion maps were calculated using RAPID software. CONTRAST:  100 mL Omnipaque 350 COMPARISON:  CT head May 2021, MRI brain October 2019 FINDINGS: CT HEAD FINDINGS Brain: There is no acute intracranial hemorrhage, mass effect, or edema. Gray-white differentiation is preserved. Patchy low-density in the supratentorial white matter is nonspecific but likely reflects similar chronic microvascular ischemic changes. There are chronic small vessel infarcts of the corona radiata and basal ganglia bilaterally. Prominence of the ventricles and sulci reflects parenchymal volume loss. Partially calcified extra-axial mass in the left posterior fossa likely reflects a meningioma similar to the prior study. Vascular: No hyperdense vessel. Skull: Unremarkable. Sinuses/Orbits: No acute abnormality. Other: Mastoid air cells are clear. ASPECTS ([facility] Stroke Program Early CT Score) - Ganglionic level infarction (caudate, lentiform nuclei, internal capsule, insula, M1-M3 cortex): 7 - Supraganglionic infarction (M4-M6 cortex): 3 Total score (0-10 with 10 being  normal): 10 Review of the MIP images confirms the above findings CTA NECK FINDINGS Aortic arch: Great vessel origins are patent. Right carotid system: Patent. Mixed plaque at the bifurcation and proximal internal carotid with minimal stenosis. Left carotid system: Patent. Calcified plaque at the proximal internal carotid causing less than 50% stenosis. Vertebral  arteries: Patent dominant left vertebral artery. Plaque at the right vertebral origin causing marked stenosis. Diminished flow within the right vertebral artery with loss of enhancement at the C2 level to the skull base. Skeleton: Degenerative changes of the cervical spine. There is a partially calcified central disc extrusion at C4-C5 with marked canal stenosis. Other neck: Unremarkable. Upper chest: No apical lung mass. Review of the MIP images confirms the above findings CTA HEAD FINDINGS Anterior circulation: Intracranial internal carotid arteries are patent. Plaque along the proximal supraclinoid right ICA causes moderate to marked stenosis. Anterior cerebral arteries are patent. Right A1 ACA is congenitally absent. Middle cerebral arteries are patent. Mild noncalcified plaque along the right M1 MCA. Posterior circulation: Intracranial left vertebral artery is patent with calcified plaque causing mild stenosis. The intracranial right vertebral artery is patent but irregular. Focal eccentric noncalcified plaque causes moderate stenosis. Venous sinuses: As permitted by contrast timing, patent. Review of the MIP images confirms the above findings CT Brain Perfusion Findings: CBF (<30%) Volume: 58mL Perfusion (Tmax>6.0s) volume: 75mL Mismatch Volume: 57mL Infarction Location: Area of territory at risk localizes to the brainstem and is probably artifactual. There is also motion degradation. IMPRESSION: There is no acute intracranial hemorrhage or evidence of acute infarction. ASPECT score is 10. Chronic microvascular ischemic changes and chronic small vessel  infarcts. High-grade stenosis at the right vertebral artery origin. 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 This is likely chronic. Intracranially, the vessel is patent but irregular with up to moderate stenosis. Plaque without hemodynamically significant stenosis at the carotid origins. Moderate to marked stenosis of the proximal supraclinoid right ICA. Perfusion imaging demonstrates no evidence of core infarction. 4 mL of calculated territory at risk localizes to the brainstem and is probably artifactual. Initial results were communicated to Dr. Cheral Marker at 9:57 am on 12/24/2020 by text page via the [facility] messaging system. Electronically Signed   By: Macy Mis M.D.   On: 12/24/2020 10:17   CT ANGIO HEAD NECK W WO CM W PERF (CODE STROKE)  Result Date: 12/24/2020 CLINICAL DATA:  Neuro deficit, acute, stroke suspected EXAM: CT HEAD WITHOUT CONTRAST CT ANGIOGRAPHY HEAD AND NECK CT PERFUSION BRAIN TECHNIQUE: Multi detector CT imaging of the head was performed using standard protocol without contrast. Multidetector CT imaging of the head and neck was performed using the standard protocol during bolus administration of intravenous contrast. Multiplanar CT image reconstructions and MIPs were obtained to evaluate the vascular anatomy. Carotid stenosis measurements (when applicable) are obtained utilizing NASCET criteria, using the distal internal carotid diameter as the denominator. Multiphase CT imaging of the brain was performed following IV bolus contrast injection. Subsequent parametric perfusion maps were calculated using RAPID software. CONTRAST:  100 mL Omnipaque 350 COMPARISON:  CT head May 2021, MRI brain October 2019 FINDINGS: CT HEAD FINDINGS Brain: There is no acute intracranial hemorrhage, mass effect, or edema. Gray-white differentiation is preserved. Patchy low-density in the supratentorial white matter is nonspecific but likely reflects similar chronic microvascular ischemic changes.  There are chronic small vessel infarcts of the corona radiata and basal ganglia bilaterally. Prominence of the ventricles and sulci reflects parenchymal volume loss. Partially calcified extra-axial mass in the left posterior fossa likely reflects a meningioma similar to the prior study. Vascular: No hyperdense vessel. Skull: Unremarkable. Sinuses/Orbits: No acute abnormality. Other: Mastoid air cells are clear. ASPECTS [facility] Stroke Program Early CT Score) - Ganglionic level infarction (caudate, lentiform nuclei, internal capsule, insula, M1-M3 cortex): 7 - Supraganglionic infarction (M4-M6 cortex): 3 Total  score (0-10 with 10 being normal): 10 Review of the MIP images confirms the above findings CTA NECK FINDINGS Aortic arch: Great vessel origins are patent. Right carotid system: Patent. Mixed plaque at the bifurcation and proximal internal carotid with minimal stenosis. Left carotid system: Patent. Calcified plaque at the proximal internal carotid causing less than 50% stenosis. Vertebral arteries: Patent dominant left vertebral artery. Plaque at the right vertebral origin causing marked stenosis. Diminished flow within the right vertebral artery with loss of enhancement at the C2 level to the skull base. Skeleton: Degenerative changes of the cervical spine. There is a partially calcified central disc extrusion at C4-C5 with marked canal stenosis. Other neck: Unremarkable. Upper chest: No apical lung mass. Review of the MIP images confirms the above findings CTA HEAD FINDINGS Anterior circulation: Intracranial internal carotid arteries are patent. Plaque along the proximal supraclinoid right ICA causes moderate to marked stenosis. Anterior cerebral arteries are patent. Right A1 ACA is congenitally absent. Middle cerebral arteries are patent. Mild noncalcified plaque along the right M1 MCA. Posterior circulation: Intracranial left vertebral artery is patent with calcified plaque causing mild stenosis. The  intracranial right vertebral artery is patent but irregular. Focal eccentric noncalcified plaque causes moderate stenosis. Venous sinuses: As permitted by contrast timing, patent. Review of the MIP images confirms the above findings CT Brain Perfusion Findings: CBF (<30%) Volume: 16mL Perfusion (Tmax>6.0s) volume: 15mL Mismatch Volume: 78mL Infarction Location: Area of territory at risk localizes to the brainstem and is probably artifactual. There is also motion degradation. IMPRESSION: There is no acute intracranial hemorrhage or evidence of acute infarction. ASPECT score is 10. Chronic microvascular ischemic changes and chronic small vessel infarcts. High-grade stenosis at the right vertebral artery origin. 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 This is likely chronic. Intracranially, the vessel is patent but irregular with up to moderate stenosis. Plaque without hemodynamically significant stenosis at the carotid origins. Moderate to marked stenosis of the proximal supraclinoid right ICA. Perfusion imaging demonstrates no evidence of core infarction. 4 mL of calculated territory at risk localizes to the brainstem and is probably artifactual. Initial results were communicated to Dr. Cheral Marker at 9:57 am on 12/24/2020 by text page via the [facility] messaging system. Electronically Signed   By: Macy Mis M.D.   On: 12/24/2020 10:17     Discharge Instructions: Discharge Instructions     Call MD for:  difficulty breathing, headache or visual disturbances   Complete by: As directed    Call MD for:  extreme fatigue   Complete by: As directed    Call MD for:  persistant dizziness or light-headedness   Complete by: As directed    Call MD for:  temperature >100.4   Complete by: As directed    Change dressing (specify)   Complete by: As directed    Dressing change: keeping wound clean dry and intact   Diet - low sodium heart healthy   Complete by: As directed    Increase activity slowly    Complete by: As directed      We treated you in the hospital for a seizure and pneumonia. The seizure is likely related to your underlying dementia. We have started you on a medication called Lacosamide which has been controlling your seizures. We also continued your Aricept that you had been taking as an outpatient. Additionally, throughout your hospitalization, you periodically became delirious at night. We started you on Trazodone 25mg  nightly, and also gave you a medication called Risperidone to use as  needed to help with your delirium. We recommend that you continue taking the 25mg  of Trazodone nightly. Please continue taking your other medications as previously prescribed. We also recommend that you follow up with a neurologist as an outpatient. Please call their office at [phone] to schedule an appointment in 1-2 weeks. We also recommend scheduling an appointment with your primary care physician as an outpatient for a hospital follow up in 1-2 weeks.   Signed: Delene Ruffini, MD 01/06/2021, 2:47 PM   Pager: [phone]

## 2021-01-06 NOTE — Progress Notes (Signed)
Occupational Therapy Treatment Patient Details Name: Ralph West MRN: [id number] DOB: November 03, 1932 Today's Date: 01/06/2021   History of present illness 87 year old male  presented to the ED with cc of aphasia 12/24/20. MRI negative; acute encephalopathy, ?seizure   PMH of anxiety, stable angina, type II diabetes, hyperlipidemia, hypertension, Alzheimer dementia, and myocardial infarction on ASA   OT comments  Patient seen by skilled OT to address bed mobility, grooming, and transfers. Patient was min assist to get to EOB due to assistance scooting towards EOB. Patient performed grooming standing at sink with min guard but min assist for mobility due to assistance with RW.  Mobility and transfers addressed with RW and education on hand placement, posture, and walker use. Patient returned to supine and was setup for lunch. Patient is expected to be discharged to SNF.    Recommendations for follow up therapy are one component of a multi-disciplinary discharge planning process, led by the attending physician.  Recommendations may be updated based on patient status, additional functional criteria and insurance authorization.    Follow Up Recommendations  Skilled nursing-short term rehab (<3 hours/day)    Assistance Recommended at Discharge Frequent or constant Supervision/Assistance  Equipment Recommendations  None recommended by OT    Recommendations for Other Services      Precautions / Restrictions Precautions Precautions: Fall Restrictions Weight Bearing Restrictions: No       Mobility Bed Mobility Overal bed mobility: Needs Assistance Bed Mobility: Supine to Sit;Sit to Supine     Supine to sit: Min assist Sit to supine: Min assist   General bed mobility comments: min assist to scoot hips towwards EOB    Transfers Overall transfer level: Needs assistance Equipment used: Rolling walker (2 wheels) Transfers: Sit to/from Stand Sit to Stand: Min assist           General  transfer comment: patient pushed RW too far in front of himself and required assisance to keep at safe distance     Balance Overall balance assessment: Needs assistance Sitting-balance support: Feet supported;No upper extremity supported Sitting balance-Leahy Scale: Fair Sitting balance - Comments: able to keep balance sitting on EOB   Standing balance support: Bilateral upper extremity supported Standing balance-Leahy Scale: Poor Standing balance comment: reliant on RW but was able to stand at sink and wash hands and face                           ADL either performed or assessed with clinical judgement   ADL Overall ADL's : Needs assistance/impaired Eating/Feeding: Set up;Sitting Eating/Feeding Details (indicate cue type and reason): setup provided at bedlevel in sitting position Grooming: Min guard;Standing;Wash/dry hands;Wash/dry face Grooming Details (indicate cue type and reason): demonstrated poor posture while standing at sink             Lower Body Dressing: Moderate assistance Lower Body Dressing Details (indicate cue type and reason): donned pullup brief with assistance to thread legs into brief and patient was able to pull up while standing             Functional mobility during ADLs: Minimal assistance General ADL Comments: able to stand with min guard but demonstrated poor posture. Min assist for balance during functional mobility due to poor safety and poor walker use    Extremity/Trunk Assessment              Vision       Perception     Praxis  Cognition Arousal/Alertness: Awake/alert Behavior During Therapy: Impulsive Overall Cognitive Status: History of cognitive impairments - at baseline Area of Impairment: Orientation;Attention;Memory;Following commands;Safety/judgement;Awareness;Problem solving                 Orientation Level: Disoriented to;Situation;Place;Time Current Attention Level: Sustained Memory:  Decreased recall of precautions;Decreased short-term memory Following Commands: Follows one step commands with increased time Safety/Judgement: Decreased awareness of safety;Decreased awareness of deficits Awareness: Intellectual Problem Solving: Slow processing;Requires verbal cues;Requires tactile cues General Comments: believed he was at home          Exercises     Shoulder Instructions       General Comments      Pertinent Vitals/ Pain       Pain Assessment: Faces Faces Pain Scale: No hurt  Home Living                                          Prior Functioning/Environment              Frequency  Min 2X/week        Progress Toward Goals  OT Goals(current goals can now be found in the care plan section)  Progress towards OT goals: Progressing toward goals  Acute Rehab OT Goals OT Goal Formulation: Patient unable to participate in goal setting Time For Goal Achievement: 01/09/21 Potential to Achieve Goals: Fair ADL Goals Pt Will Perform Grooming: with min assist;sitting Pt Will Perform Lower Body Bathing: with mod assist;sitting/lateral leans;sit to/from stand Pt Will Perform Lower Body Dressing: with mod assist;sitting/lateral leans;sit to/from stand Pt Will Transfer to Toilet: with min assist;stand pivot transfer Pt Will Perform Toileting - Clothing Manipulation and hygiene: with mod assist;sitting/lateral leans;sit to/from stand Additional ADL Goal #1: Pt will follow 1 step commands 75% of the time.  Plan Discharge plan remains appropriate;Frequency remains appropriate    Co-evaluation                 AM-PAC OT "6 Clicks" Daily Activity     Outcome Measure   Help from another person eating meals?: A Little Help from another person taking care of personal grooming?: A Little Help from another person toileting, which includes using toliet, bedpan, or urinal?: A Lot Help from another person bathing (including washing, rinsing,  drying)?: A Lot Help from another person to put on and taking off regular upper body clothing?: A Lot Help from another person to put on and taking off regular lower body clothing?: A Lot 6 Click Score: 14    End of Session Equipment Utilized During Treatment: Gait belt;Rolling walker (2 wheels)  OT Visit Diagnosis: Unsteadiness on feet (R26.81);Other abnormalities of gait and mobility (R26.89);Muscle weakness (generalized) (M62.81);Other symptoms and signs involving cognitive function   Activity Tolerance Patient tolerated treatment well   Patient Left in bed;with call bell/phone within reach;with bed alarm set   Nurse Communication Mobility status        Time: 1250-1315 OT Time Calculation (min): 25 min  Charges: OT General Charges $OT Visit: 1 Visit OT Treatments $Self Care/Home Management : 8-22 mins $Therapeutic Activity: 8-22 mins  Lodema Hong, OTA Acute Rehabilitation Services  Pager [phone] Office [phone]   Trixie Dredge 01/06/2021, 1:36 PM

## 2021-01-06 NOTE — Progress Notes (Signed)
Speech Language Pathology Treatment: Dysphagia  Patient Details Name: Ralph West MRN: [id number] DOB: 07-29-1932 Today's Date: 01/06/2021 Time: 4163-8453 SLP Time Calculation (min) (ACUTE ONLY): 10 min  Assessment / Plan / Recommendation Clinical Impression  Pt was seen for dysphagia treatment which was abbreviated per pt's request. Pt's nurse, Ralph West, reported that the pt has been tolerating the current diet without overt s/sx of aspiration. Pt was educated regarding the results of the modified barium swallow study and swallowing precautions. Pt verbalized understanding, but reinforcement will likely be necessary considering pt's impairments in memory. Pt exhibited throat clearing with thin liquids via cup, but tolerated purees without overt s/sx of aspiration. Mild oral holding was noted with purees. Pt refused all additional boluses stating, "I think I've had just about all I can handle." Pt currently has discharge orders and continued SLP services are recommended following discharge.    HPI HPI: Pt is an 87 year old male who presented to the ED with AMS, seizure-like activity in the ED with persistent tachycardia and worsening respiratory status. MRI brain negative. EEG 11/29: nonspecific cortical dysfunction in the frontotemporal region, moderate diffuse encephalopathy, no seizures or definite epileptiform discharges. Pt seen by SLP 11/30-12/6 and was discharged on dysphagia 3 and thin liquids. SLP reconsulted 12/8 due to coughing with thin liquids. PMH: anxiety, stable angina, type II diabetes, hyperlipidemia, hypertension, Alzheimer dementia, and myocardial infarction on ASA.      SLP Plan  Continue with current plan of care      Recommendations for follow up therapy are one component of a multi-disciplinary discharge planning process, led by the attending physician.  Recommendations may be updated based on patient status, additional functional criteria and insurance authorization.     Recommendations  Diet recommendations: Dysphagia 3 (mechanical soft);Nectar-thick liquid Liquids provided via: Cup;Straw Medication Administration: Whole meds with puree Supervision: Patient able to self feed;Staff to assist with self feeding Compensations: Slow rate;Small sips/bites;Minimize environmental distractions;Multiple dry swallows after each bite/sip Postural Changes and/or Swallow Maneuvers: Seated upright 90 degrees                Oral Care Recommendations: Oral care BID Follow Up Recommendations: Skilled nursing-short term rehab (<3 hours/day) Assistance recommended at discharge: Frequent or constant Supervision/Assistance SLP Visit Diagnosis: Dysphagia, unspecified (R13.10) Plan: Continue with current plan of care       Ralph West I. Ralph West, [place], [place] Office number [phone] Pager [place]  01/06/2021, 4:52 PM

## 2021-01-16 DIAGNOSIS — I739 Peripheral vascular disease, unspecified: Secondary | ICD-10-CM | POA: Diagnosis not present

## 2021-01-16 DIAGNOSIS — G309 Alzheimer's disease, unspecified: Secondary | ICD-10-CM | POA: Diagnosis not present

## 2021-01-16 DIAGNOSIS — F339 Major depressive disorder, recurrent, unspecified: Secondary | ICD-10-CM | POA: Diagnosis not present

## 2021-01-16 DIAGNOSIS — E785 Hyperlipidemia, unspecified: Secondary | ICD-10-CM | POA: Diagnosis not present

## 2021-01-16 DIAGNOSIS — R2681 Unsteadiness on feet: Secondary | ICD-10-CM | POA: Diagnosis not present

## 2021-01-16 DIAGNOSIS — I10 Essential (primary) hypertension: Secondary | ICD-10-CM | POA: Diagnosis not present

## 2021-01-16 DIAGNOSIS — E663 Overweight: Secondary | ICD-10-CM | POA: Diagnosis not present

## 2021-01-16 DIAGNOSIS — E119 Type 2 diabetes mellitus without complications: Secondary | ICD-10-CM | POA: Diagnosis not present

## 2021-01-16 DIAGNOSIS — R54 Age-related physical debility: Secondary | ICD-10-CM | POA: Diagnosis not present

## 2021-01-16 DIAGNOSIS — G4089 Other seizures: Secondary | ICD-10-CM | POA: Diagnosis not present

## 2021-01-16 DIAGNOSIS — M810 Age-related osteoporosis without current pathological fracture: Secondary | ICD-10-CM | POA: Diagnosis not present

## 2021-01-16 DIAGNOSIS — Z6827 Body mass index (BMI) 27.0-27.9, adult: Secondary | ICD-10-CM | POA: Diagnosis not present

## 2021-01-21 ENCOUNTER — Ambulatory Visit (INDEPENDENT_AMBULATORY_CARE_PROVIDER_SITE_OTHER): Payer: Medicare Other | Admitting: Neurology

## 2021-01-21 ENCOUNTER — Other Ambulatory Visit: Payer: Self-pay

## 2021-01-21 ENCOUNTER — Encounter: Payer: Self-pay | Admitting: Neurology

## 2021-01-21 VITALS — BP 108/63 | HR 72

## 2021-01-21 DIAGNOSIS — I209 Angina pectoris, unspecified: Secondary | ICD-10-CM | POA: Diagnosis not present

## 2021-01-21 DIAGNOSIS — F03B18 Unspecified dementia, moderate, with other behavioral disturbance: Secondary | ICD-10-CM | POA: Diagnosis not present

## 2021-01-21 DIAGNOSIS — G40009 Localization-related (focal) (partial) idiopathic epilepsy and epileptic syndromes with seizures of localized onset, not intractable, without status epilepticus: Secondary | ICD-10-CM

## 2021-01-21 NOTE — Patient Instructions (Signed)
Continue Lacosamide 100mg  twice a day  2. Continue Donepezil 10mg  daily  3. Continue 24/7 care, agree with Memory Care on SNF discharge  4. Follow-up in 6 months, call for any changes   Seizure Precautions: 1. If medication has been prescribed for you to prevent seizures, take it exactly as directed.  Do not stop taking the medicine without talking to your doctor first, even if you have not had a seizure in a long time.   2. Avoid activities in which a seizure would cause danger to yourself or to others.  Don't operate dangerous machinery, swim alone, or climb in high or dangerous places, such as on ladders, roofs, or girders.  Do not drive unless your doctor says you may.  3. If you have any warning that you may have a seizure, lay down in a safe place where you can't hurt yourself.    4.  No driving for 6 months from last seizure, as per [facility]   Please refer to the following link on the [place] website for more information: [email or web address]/[email or web address]   5.  Maintain good sleep hygiene.  6.  Contact your doctor if you have any problems that may be related to the medicine you are taking.  7.  Call 911 and bring the patient back to the ED if:        A.  The seizure lasts longer than 5 minutes.       B.  The patient doesn't awaken shortly after the seizure  C.  The patient has new problems such as difficulty seeing, speaking or moving  D.  The patient was injured during the seizure  E.  The patient has a temperature over 102 F (39C)  F.  The patient vomited and now is having trouble breathing

## 2021-01-21 NOTE — Progress Notes (Signed)
NEUROLOGY CONSULTATION NOTE  [name] MRN: [id number] DOB: Mar 25, 1932  Referring provider: Dr. Axel Filler Primary care provider: Dr. Vassie Loll Eyk  Reason for consult:  seizure  Dear Dr Evette Doffing:  Thank you for your kind referral of Ralph West for consultation of the above symptoms. Although his history is well known to you, please allow me to reiterate it for the purpose of our medical record. The patient was accompanied to the clinic by his wife who also provides collateral information. Records and images were personally reviewed where available.   HISTORY OF PRESENT ILLNESS: This is an 87 year old man with a history of hypertension, hyperlipidemia, diabetes, NSTEMI, dementia, presenting after hospitalization for new onset seizure that occurred on 12/24/20. He was in his usual state of health when per notes his wife heard him grunting, moaning, and flailing around in bed that morning. She states today that he was moving his mouth but making hardly any sound, no jerking or shaking noted. At baseline he is conversant and ambulatory, but that day he was not following instructions. When EMS arrived, HR was 150, he was awake, agitated, with garbled speech, unable to follow commands, unable to ambulate. He fell asleep on the ride to the ER, no definite weakness noted. On initial exam in the ER, he had expressive and receptive aphasia with no focal weakness. Head CT no acute changes. CTA head and neck showed high-grade stenosis at the right vertebral artery origin, occlusion of the distal extracranial vertebral artery, likely chronic. Intracranially, the vessel is patent but irregular with up to moderate stenosis, moderate to marked stenosis of the proximal supraclinoid right ICA. While in the ER, he had a witnessed seizure with head turn to the right/right gaze deviation, jerking of the right upper extremity. He was able to speak a few words at the start of the seizure, which lasted 20  minutes. He was given IV Ativan and started on Levetiracetam but due to agitation, switched to Lacosamide 100mg  BID. EEG showed diffuse slowing, maximal over the left frontotemporal region. MRI brain did not show any acute changes, there was diffuse atrophy and chronic microvascular disease. His course was complicated by aspiration pneumonia and delirium, he was treated with antibiotics and started on Trazodone 25mg  qhs and prn Risperdal 0.25mg . He had atrial tachycardia with rates in the 150-170s. CK was 532.   He was discharged to [facility] and has been there since 01/06/21, his wife is unaware of further seizures since hospitalization. She denies any prior history of seizures, no prior history of staring/unresponsiveness. He was previously evaluated by neurologist Dr. Rexene Alberts in 2019 for episodes of involuntary movements of either his left leg or arms, EEG at that time was normal. She states he has not had those types of episodes in a year. She has not noticed any side effects on Lacosamide, he states he is doing great. He denies any headaches, dizziness, vision changes, focal numbness/tingling/weakness. His wife reports he has had dementia for 5 years, he needs assistance with dressing and bathing. He is able to feed himself. He is on Donepezil 10mg  daily. His wife denies any hallucinations, he would say he wants to go home when he is in their home of 50+ years. While at [facility], he apparently ambulated by himself to another resident's bathroom. He tries to get up on all 3 shifts, which is his baseline at home per wife. He is on Trazodone 25mg  qhs.   Epilepsy Risk Factors:  Dementia. His  wife reports he had a normal birth and early development.  There is no history of febrile convulsions, CNS infections such as meningitis/encephalitis, significant traumatic brain injury, neurosurgical procedures, or family history of seizures.    PAST MEDICAL HISTORY: Past Medical History:  Diagnosis Date   Anxiety,  generalized    Diabetes mellitus without complication ([place])    Hernia, umbilical    Hyperlipidemia    Hypertension    Kidney stones     PAST SURGICAL HISTORY: Past Surgical History:  Procedure Laterality Date   CHOLECYSTECTOMY     INGUINAL HERNIA REPAIR     KIDNEY STONE SURGERY      MEDICATIONS: Current Outpatient Medications on File Prior to Visit  Medication Sig Dispense Refill   aspirin EC 81 MG tablet Take 81 mg by mouth daily.     Calcium Carbonate-Vitamin D (OYSTER SHELL CALCIUM/D PO) Take 1 tablet by mouth daily.     donepezil (ARICEPT) 10 MG tablet Take 10 mg by mouth daily.     Infant Care Products [facility]) OINT Apply topically.     Lacosamide 100 MG TABS Take 1 tablet (100 mg total) by mouth 2 (two) times daily. 60 tablet 0   metoprolol tartrate (LOPRESSOR) 25 MG tablet Take 25 mg by mouth 2 (two) times daily.     multivitamin-iron-minerals-folic acid (CENTRUM) chewable tablet Chew 1 tablet by mouth daily.     nitroGLYCERIN (NITROSTAT) 0.4 MG SL tablet Place 1 tablet (0.4 mg total) under the tongue every 5 (five) minutes as needed. 30 tablet 3   rosuvastatin (CRESTOR) 5 MG tablet Take 5 mg by mouth daily.     sertraline (ZOLOFT) 25 MG tablet Take 25 mg by mouth daily.     traZODone (DESYREL) 50 MG tablet Take 0.5 tablets (25 mg total) by mouth at bedtime. 30 tablet 0   No current facility-administered medications on file prior to visit.    ALLERGIES: No Known Allergies  FAMILY HISTORY: Family History  Problem Relation Age of Onset   Alzheimer's disease Sister    Cancer Brother     SOCIAL HISTORY: Social History   Socioeconomic History   Marital status: Married    Spouse name: Not on file   Number of children: Not on file   Years of education: Not on file   Highest education level: Not on file  Occupational History   Not on file  Tobacco Use   Smoking status: Former   Smokeless tobacco: Never  Vaping Use   Vaping Use: Never used  Substance and  Sexual Activity   Alcohol use: Not Currently   Drug use: Never   Sexual activity: Not Currently    Birth control/protection: None  Other Topics Concern   Not on file  Social History Narrative   Pt lives at home with his wife. He has 3 children. Pt completed grade school.   Social Determinants of Health   Financial Resource Strain: Not on file  Food Insecurity: Not on file  Transportation Needs: Not on file  Physical Activity: Not on file  Stress: Not on file  Social Connections: Not on file  Intimate Partner Violence: Not on file     PHYSICAL EXAM: Vitals:   01/21/21 1258  BP: 108/63  Pulse: 72  SpO2: 96%   General: No acute distress Head:  Normocephalic/atraumatic Skin/Extremities: No rash, no edema Neurological Exam: Mental status: alert and oriented to person, bday. Does not know year, states month is December. No dysarthria or aphasia, able  to answer simple questions and follow commands. Fund of knowledge is reduced.  Recent and remote memory are impaired.  Attention and concentration are reduced. Cranial nerves: CN I: not tested CN II: pupils equal, round and reactive to light, visual fields intact CN III, IV, VI:  full range of motion, no nystagmus, no ptosis CN V: facial sensation intact CN VII: upper and lower face symmetric CN VIII: hearing intact to conversation Bulk & Tone: +cogwheeling on right Motor: 5/5 throughout with no pronator drift. Sensation: intact to light touch, cold Deep Tendon Reflexes: +1 throughout Cerebellar: no incoordination on finger to nose testing Gait: ambulated with 1-person assist, gait slightly wide-based, no ataxia Tremor: bilateral resting hand tremors R>L, no postural or endpoint tremor seen   IMPRESSION: This is an 87 year old man with a history of hypertension, hyperlipidemia, diabetes, NSTEMI, dementia, presenting after hospitalization for new onset seizure that occurred on 12/24/20. He presented with altered mental status,  then had a witnessed seizure in the ER with head turn to the right/right gaze deviation, jerking of the right upper extremity. EEG showed diffuse slowing, maximal over the left frontotemporal region, suggestive of left frontotemporal onset, etiology likely due to underlying dementia. MRI brain did not show any acute changes, there was diffuse atrophy and chronic microvascular disease. He has been seizure-free since 12/24/20 on Lacosamide 100mg  BID. Continue Donepezil 10mg  daily and 24/7 care for dementia. Discussed likely need for Memory Care if not available at current living facility. Follow-up in 6 months, call for any changes.    Thank you for allowing me to participate in the care of this patient. Please do not hesitate to call for any questions or concerns.   Ellouise Newer, M.D.  CC: Dr. Evette Doffing, Dr. Nona Dell

## 2021-01-22 DIAGNOSIS — L24A0 Irritant contact dermatitis due to friction or contact with body fluids, unspecified: Secondary | ICD-10-CM | POA: Diagnosis not present

## 2021-01-29 DIAGNOSIS — L24A0 Irritant contact dermatitis due to friction or contact with body fluids, unspecified: Secondary | ICD-10-CM | POA: Diagnosis not present

## 2021-02-03 DIAGNOSIS — G4089 Other seizures: Secondary | ICD-10-CM | POA: Diagnosis not present

## 2021-02-03 DIAGNOSIS — Z6827 Body mass index (BMI) 27.0-27.9, adult: Secondary | ICD-10-CM | POA: Diagnosis not present

## 2021-02-03 DIAGNOSIS — M6281 Muscle weakness (generalized): Secondary | ICD-10-CM | POA: Diagnosis not present

## 2021-02-03 DIAGNOSIS — I471 Supraventricular tachycardia: Secondary | ICD-10-CM | POA: Diagnosis not present

## 2021-02-03 DIAGNOSIS — G309 Alzheimer's disease, unspecified: Secondary | ICD-10-CM | POA: Diagnosis not present

## 2021-02-03 DIAGNOSIS — E119 Type 2 diabetes mellitus without complications: Secondary | ICD-10-CM | POA: Diagnosis not present

## 2021-02-03 DIAGNOSIS — I10 Essential (primary) hypertension: Secondary | ICD-10-CM | POA: Diagnosis not present

## 2021-02-03 DIAGNOSIS — R269 Unspecified abnormalities of gait and mobility: Secondary | ICD-10-CM | POA: Diagnosis not present

## 2021-02-03 DIAGNOSIS — I2510 Atherosclerotic heart disease of native coronary artery without angina pectoris: Secondary | ICD-10-CM | POA: Diagnosis not present

## 2021-02-05 DIAGNOSIS — F0280 Dementia in other diseases classified elsewhere without behavioral disturbance: Secondary | ICD-10-CM | POA: Diagnosis not present

## 2021-02-05 DIAGNOSIS — G9341 Metabolic encephalopathy: Secondary | ICD-10-CM | POA: Diagnosis not present

## 2021-02-05 DIAGNOSIS — E785 Hyperlipidemia, unspecified: Secondary | ICD-10-CM | POA: Diagnosis not present

## 2021-02-05 DIAGNOSIS — F419 Anxiety disorder, unspecified: Secondary | ICD-10-CM | POA: Diagnosis not present

## 2021-02-05 DIAGNOSIS — I10 Essential (primary) hypertension: Secondary | ICD-10-CM | POA: Diagnosis not present

## 2021-02-05 DIAGNOSIS — E46 Unspecified protein-calorie malnutrition: Secondary | ICD-10-CM | POA: Diagnosis not present

## 2021-02-05 DIAGNOSIS — Z7982 Long term (current) use of aspirin: Secondary | ICD-10-CM | POA: Diagnosis not present

## 2021-02-05 DIAGNOSIS — G309 Alzheimer's disease, unspecified: Secondary | ICD-10-CM | POA: Diagnosis not present

## 2021-02-05 DIAGNOSIS — I479 Paroxysmal tachycardia, unspecified: Secondary | ICD-10-CM | POA: Diagnosis not present

## 2021-02-05 DIAGNOSIS — F32A Depression, unspecified: Secondary | ICD-10-CM | POA: Diagnosis not present

## 2021-02-05 DIAGNOSIS — I252 Old myocardial infarction: Secondary | ICD-10-CM | POA: Diagnosis not present

## 2021-02-05 DIAGNOSIS — H9190 Unspecified hearing loss, unspecified ear: Secondary | ICD-10-CM | POA: Diagnosis not present

## 2021-02-05 DIAGNOSIS — Z87442 Personal history of urinary calculi: Secondary | ICD-10-CM | POA: Diagnosis not present

## 2021-02-05 DIAGNOSIS — G40209 Localization-related (focal) (partial) symptomatic epilepsy and epileptic syndromes with complex partial seizures, not intractable, without status epilepticus: Secondary | ICD-10-CM | POA: Diagnosis not present

## 2021-02-05 DIAGNOSIS — K5909 Other constipation: Secondary | ICD-10-CM | POA: Diagnosis not present

## 2021-02-05 DIAGNOSIS — J690 Pneumonitis due to inhalation of food and vomit: Secondary | ICD-10-CM | POA: Diagnosis not present

## 2021-02-05 DIAGNOSIS — E1151 Type 2 diabetes mellitus with diabetic peripheral angiopathy without gangrene: Secondary | ICD-10-CM | POA: Diagnosis not present

## 2021-02-07 DIAGNOSIS — M6281 Muscle weakness (generalized): Secondary | ICD-10-CM | POA: Diagnosis not present

## 2021-02-07 DIAGNOSIS — G309 Alzheimer's disease, unspecified: Secondary | ICD-10-CM | POA: Diagnosis not present

## 2021-02-07 DIAGNOSIS — I10 Essential (primary) hypertension: Secondary | ICD-10-CM | POA: Diagnosis not present

## 2021-02-07 DIAGNOSIS — I471 Supraventricular tachycardia: Secondary | ICD-10-CM | POA: Diagnosis not present

## 2021-02-09 DIAGNOSIS — G40209 Localization-related (focal) (partial) symptomatic epilepsy and epileptic syndromes with complex partial seizures, not intractable, without status epilepticus: Secondary | ICD-10-CM | POA: Diagnosis not present

## 2021-02-09 DIAGNOSIS — H9190 Unspecified hearing loss, unspecified ear: Secondary | ICD-10-CM | POA: Diagnosis not present

## 2021-02-09 DIAGNOSIS — F0280 Dementia in other diseases classified elsewhere without behavioral disturbance: Secondary | ICD-10-CM | POA: Diagnosis not present

## 2021-02-09 DIAGNOSIS — E1151 Type 2 diabetes mellitus with diabetic peripheral angiopathy without gangrene: Secondary | ICD-10-CM | POA: Diagnosis not present

## 2021-02-09 DIAGNOSIS — G309 Alzheimer's disease, unspecified: Secondary | ICD-10-CM | POA: Diagnosis not present

## 2021-02-09 DIAGNOSIS — J690 Pneumonitis due to inhalation of food and vomit: Secondary | ICD-10-CM | POA: Diagnosis not present

## 2021-02-10 DIAGNOSIS — R895 Abnormal microbiological findings in specimens from other organs, systems and tissues: Secondary | ICD-10-CM | POA: Diagnosis not present

## 2021-02-10 DIAGNOSIS — L304 Erythema intertrigo: Secondary | ICD-10-CM | POA: Diagnosis not present

## 2021-02-10 DIAGNOSIS — B372 Candidiasis of skin and nail: Secondary | ICD-10-CM | POA: Diagnosis not present

## 2021-02-10 DIAGNOSIS — G309 Alzheimer's disease, unspecified: Secondary | ICD-10-CM | POA: Diagnosis not present

## 2021-02-10 DIAGNOSIS — N390 Urinary tract infection, site not specified: Secondary | ICD-10-CM | POA: Diagnosis not present

## 2021-02-11 DIAGNOSIS — F5102 Adjustment insomnia: Secondary | ICD-10-CM | POA: Diagnosis not present

## 2021-02-11 DIAGNOSIS — F03918 Unspecified dementia, unspecified severity, with other behavioral disturbance: Secondary | ICD-10-CM | POA: Diagnosis not present

## 2021-02-11 DIAGNOSIS — F419 Anxiety disorder, unspecified: Secondary | ICD-10-CM | POA: Diagnosis not present

## 2021-02-12 DIAGNOSIS — H9190 Unspecified hearing loss, unspecified ear: Secondary | ICD-10-CM | POA: Diagnosis not present

## 2021-02-12 DIAGNOSIS — F0280 Dementia in other diseases classified elsewhere without behavioral disturbance: Secondary | ICD-10-CM | POA: Diagnosis not present

## 2021-02-12 DIAGNOSIS — E119 Type 2 diabetes mellitus without complications: Secondary | ICD-10-CM | POA: Diagnosis not present

## 2021-02-12 DIAGNOSIS — J690 Pneumonitis due to inhalation of food and vomit: Secondary | ICD-10-CM | POA: Diagnosis not present

## 2021-02-12 DIAGNOSIS — E039 Hypothyroidism, unspecified: Secondary | ICD-10-CM | POA: Diagnosis not present

## 2021-02-12 DIAGNOSIS — R799 Abnormal finding of blood chemistry, unspecified: Secondary | ICD-10-CM | POA: Diagnosis not present

## 2021-02-12 DIAGNOSIS — I10 Essential (primary) hypertension: Secondary | ICD-10-CM | POA: Diagnosis not present

## 2021-02-12 DIAGNOSIS — D649 Anemia, unspecified: Secondary | ICD-10-CM | POA: Diagnosis not present

## 2021-02-12 DIAGNOSIS — G309 Alzheimer's disease, unspecified: Secondary | ICD-10-CM | POA: Diagnosis not present

## 2021-02-12 DIAGNOSIS — E1151 Type 2 diabetes mellitus with diabetic peripheral angiopathy without gangrene: Secondary | ICD-10-CM | POA: Diagnosis not present

## 2021-02-12 DIAGNOSIS — G40209 Localization-related (focal) (partial) symptomatic epilepsy and epileptic syndromes with complex partial seizures, not intractable, without status epilepticus: Secondary | ICD-10-CM | POA: Diagnosis not present

## 2021-02-12 DIAGNOSIS — E559 Vitamin D deficiency, unspecified: Secondary | ICD-10-CM | POA: Diagnosis not present

## 2021-02-12 DIAGNOSIS — E785 Hyperlipidemia, unspecified: Secondary | ICD-10-CM | POA: Diagnosis not present

## 2021-02-14 DIAGNOSIS — L609 Nail disorder, unspecified: Secondary | ICD-10-CM | POA: Diagnosis not present

## 2021-02-17 DIAGNOSIS — G309 Alzheimer's disease, unspecified: Secondary | ICD-10-CM | POA: Diagnosis not present

## 2021-02-17 DIAGNOSIS — H9190 Unspecified hearing loss, unspecified ear: Secondary | ICD-10-CM | POA: Diagnosis not present

## 2021-02-17 DIAGNOSIS — G40209 Localization-related (focal) (partial) symptomatic epilepsy and epileptic syndromes with complex partial seizures, not intractable, without status epilepticus: Secondary | ICD-10-CM | POA: Diagnosis not present

## 2021-02-17 DIAGNOSIS — J690 Pneumonitis due to inhalation of food and vomit: Secondary | ICD-10-CM | POA: Diagnosis not present

## 2021-02-17 DIAGNOSIS — F0280 Dementia in other diseases classified elsewhere without behavioral disturbance: Secondary | ICD-10-CM | POA: Diagnosis not present

## 2021-02-17 DIAGNOSIS — E1151 Type 2 diabetes mellitus with diabetic peripheral angiopathy without gangrene: Secondary | ICD-10-CM | POA: Diagnosis not present

## 2021-02-19 DIAGNOSIS — L989 Disorder of the skin and subcutaneous tissue, unspecified: Secondary | ICD-10-CM | POA: Diagnosis not present

## 2021-02-19 DIAGNOSIS — R269 Unspecified abnormalities of gait and mobility: Secondary | ICD-10-CM | POA: Diagnosis not present

## 2021-02-19 DIAGNOSIS — G309 Alzheimer's disease, unspecified: Secondary | ICD-10-CM | POA: Diagnosis not present

## 2021-02-19 DIAGNOSIS — M6281 Muscle weakness (generalized): Secondary | ICD-10-CM | POA: Diagnosis not present

## 2021-02-21 DIAGNOSIS — G309 Alzheimer's disease, unspecified: Secondary | ICD-10-CM | POA: Diagnosis not present

## 2021-02-21 DIAGNOSIS — E1151 Type 2 diabetes mellitus with diabetic peripheral angiopathy without gangrene: Secondary | ICD-10-CM | POA: Diagnosis not present

## 2021-02-21 DIAGNOSIS — J690 Pneumonitis due to inhalation of food and vomit: Secondary | ICD-10-CM | POA: Diagnosis not present

## 2021-02-21 DIAGNOSIS — G40209 Localization-related (focal) (partial) symptomatic epilepsy and epileptic syndromes with complex partial seizures, not intractable, without status epilepticus: Secondary | ICD-10-CM | POA: Diagnosis not present

## 2021-02-21 DIAGNOSIS — F0280 Dementia in other diseases classified elsewhere without behavioral disturbance: Secondary | ICD-10-CM | POA: Diagnosis not present

## 2021-02-21 DIAGNOSIS — H9190 Unspecified hearing loss, unspecified ear: Secondary | ICD-10-CM | POA: Diagnosis not present

## 2021-02-24 DIAGNOSIS — S61412A Laceration without foreign body of left hand, initial encounter: Secondary | ICD-10-CM | POA: Diagnosis not present

## 2021-02-24 DIAGNOSIS — E1151 Type 2 diabetes mellitus with diabetic peripheral angiopathy without gangrene: Secondary | ICD-10-CM | POA: Diagnosis not present

## 2021-02-24 DIAGNOSIS — W19XXXA Unspecified fall, initial encounter: Secondary | ICD-10-CM | POA: Diagnosis not present

## 2021-02-24 DIAGNOSIS — S0181XA Laceration without foreign body of other part of head, initial encounter: Secondary | ICD-10-CM | POA: Diagnosis not present

## 2021-02-24 DIAGNOSIS — H9190 Unspecified hearing loss, unspecified ear: Secondary | ICD-10-CM | POA: Diagnosis not present

## 2021-02-24 DIAGNOSIS — M6281 Muscle weakness (generalized): Secondary | ICD-10-CM | POA: Diagnosis not present

## 2021-02-24 DIAGNOSIS — R569 Unspecified convulsions: Secondary | ICD-10-CM | POA: Diagnosis not present

## 2021-02-24 DIAGNOSIS — J690 Pneumonitis due to inhalation of food and vomit: Secondary | ICD-10-CM | POA: Diagnosis not present

## 2021-02-24 DIAGNOSIS — G40209 Localization-related (focal) (partial) symptomatic epilepsy and epileptic syndromes with complex partial seizures, not intractable, without status epilepticus: Secondary | ICD-10-CM | POA: Diagnosis not present

## 2021-02-24 DIAGNOSIS — G309 Alzheimer's disease, unspecified: Secondary | ICD-10-CM | POA: Diagnosis not present

## 2021-02-24 DIAGNOSIS — R2689 Other abnormalities of gait and mobility: Secondary | ICD-10-CM | POA: Diagnosis not present

## 2021-02-24 DIAGNOSIS — S60222A Contusion of left hand, initial encounter: Secondary | ICD-10-CM | POA: Diagnosis not present

## 2021-02-24 DIAGNOSIS — F0280 Dementia in other diseases classified elsewhere without behavioral disturbance: Secondary | ICD-10-CM | POA: Diagnosis not present

## 2021-02-24 DIAGNOSIS — R269 Unspecified abnormalities of gait and mobility: Secondary | ICD-10-CM | POA: Diagnosis not present

## 2021-03-02 DIAGNOSIS — G40209 Localization-related (focal) (partial) symptomatic epilepsy and epileptic syndromes with complex partial seizures, not intractable, without status epilepticus: Secondary | ICD-10-CM | POA: Diagnosis not present

## 2021-03-02 DIAGNOSIS — E1151 Type 2 diabetes mellitus with diabetic peripheral angiopathy without gangrene: Secondary | ICD-10-CM | POA: Diagnosis not present

## 2021-03-02 DIAGNOSIS — J690 Pneumonitis due to inhalation of food and vomit: Secondary | ICD-10-CM | POA: Diagnosis not present

## 2021-03-02 DIAGNOSIS — F0280 Dementia in other diseases classified elsewhere without behavioral disturbance: Secondary | ICD-10-CM | POA: Diagnosis not present

## 2021-03-02 DIAGNOSIS — G309 Alzheimer's disease, unspecified: Secondary | ICD-10-CM | POA: Diagnosis not present

## 2021-03-02 DIAGNOSIS — H9190 Unspecified hearing loss, unspecified ear: Secondary | ICD-10-CM | POA: Diagnosis not present

## 2021-03-03 DIAGNOSIS — R2689 Other abnormalities of gait and mobility: Secondary | ICD-10-CM | POA: Diagnosis not present

## 2021-03-03 DIAGNOSIS — R269 Unspecified abnormalities of gait and mobility: Secondary | ICD-10-CM | POA: Diagnosis not present

## 2021-03-03 DIAGNOSIS — G309 Alzheimer's disease, unspecified: Secondary | ICD-10-CM | POA: Diagnosis not present

## 2021-03-03 DIAGNOSIS — W19XXXA Unspecified fall, initial encounter: Secondary | ICD-10-CM | POA: Diagnosis not present

## 2021-03-03 DIAGNOSIS — M6281 Muscle weakness (generalized): Secondary | ICD-10-CM | POA: Diagnosis not present

## 2021-03-03 DIAGNOSIS — S51012A Laceration without foreign body of left elbow, initial encounter: Secondary | ICD-10-CM | POA: Diagnosis not present

## 2021-03-03 DIAGNOSIS — Z9181 History of falling: Secondary | ICD-10-CM | POA: Diagnosis not present

## 2021-03-06 DIAGNOSIS — L22 Diaper dermatitis: Secondary | ICD-10-CM | POA: Diagnosis not present

## 2021-03-06 DIAGNOSIS — G309 Alzheimer's disease, unspecified: Secondary | ICD-10-CM | POA: Diagnosis not present

## 2021-03-06 DIAGNOSIS — M6281 Muscle weakness (generalized): Secondary | ICD-10-CM | POA: Diagnosis not present

## 2021-03-06 DIAGNOSIS — R269 Unspecified abnormalities of gait and mobility: Secondary | ICD-10-CM | POA: Diagnosis not present

## 2021-03-07 DIAGNOSIS — I479 Paroxysmal tachycardia, unspecified: Secondary | ICD-10-CM | POA: Diagnosis not present

## 2021-03-07 DIAGNOSIS — F0280 Dementia in other diseases classified elsewhere without behavioral disturbance: Secondary | ICD-10-CM | POA: Diagnosis not present

## 2021-03-07 DIAGNOSIS — Z87442 Personal history of urinary calculi: Secondary | ICD-10-CM | POA: Diagnosis not present

## 2021-03-07 DIAGNOSIS — G40209 Localization-related (focal) (partial) symptomatic epilepsy and epileptic syndromes with complex partial seizures, not intractable, without status epilepticus: Secondary | ICD-10-CM | POA: Diagnosis not present

## 2021-03-07 DIAGNOSIS — H9190 Unspecified hearing loss, unspecified ear: Secondary | ICD-10-CM | POA: Diagnosis not present

## 2021-03-07 DIAGNOSIS — G9341 Metabolic encephalopathy: Secondary | ICD-10-CM | POA: Diagnosis not present

## 2021-03-07 DIAGNOSIS — Z9181 History of falling: Secondary | ICD-10-CM | POA: Diagnosis not present

## 2021-03-07 DIAGNOSIS — I10 Essential (primary) hypertension: Secondary | ICD-10-CM | POA: Diagnosis not present

## 2021-03-07 DIAGNOSIS — E785 Hyperlipidemia, unspecified: Secondary | ICD-10-CM | POA: Diagnosis not present

## 2021-03-07 DIAGNOSIS — F32A Depression, unspecified: Secondary | ICD-10-CM | POA: Diagnosis not present

## 2021-03-07 DIAGNOSIS — J690 Pneumonitis due to inhalation of food and vomit: Secondary | ICD-10-CM | POA: Diagnosis not present

## 2021-03-07 DIAGNOSIS — R2689 Other abnormalities of gait and mobility: Secondary | ICD-10-CM | POA: Diagnosis not present

## 2021-03-07 DIAGNOSIS — E46 Unspecified protein-calorie malnutrition: Secondary | ICD-10-CM | POA: Diagnosis not present

## 2021-03-07 DIAGNOSIS — F419 Anxiety disorder, unspecified: Secondary | ICD-10-CM | POA: Diagnosis not present

## 2021-03-07 DIAGNOSIS — Z7982 Long term (current) use of aspirin: Secondary | ICD-10-CM | POA: Diagnosis not present

## 2021-03-07 DIAGNOSIS — K5909 Other constipation: Secondary | ICD-10-CM | POA: Diagnosis not present

## 2021-03-07 DIAGNOSIS — S60221A Contusion of right hand, initial encounter: Secondary | ICD-10-CM | POA: Diagnosis not present

## 2021-03-07 DIAGNOSIS — M6281 Muscle weakness (generalized): Secondary | ICD-10-CM | POA: Diagnosis not present

## 2021-03-07 DIAGNOSIS — I252 Old myocardial infarction: Secondary | ICD-10-CM | POA: Diagnosis not present

## 2021-03-07 DIAGNOSIS — E1151 Type 2 diabetes mellitus with diabetic peripheral angiopathy without gangrene: Secondary | ICD-10-CM | POA: Diagnosis not present

## 2021-03-07 DIAGNOSIS — G309 Alzheimer's disease, unspecified: Secondary | ICD-10-CM | POA: Diagnosis not present

## 2021-03-07 DIAGNOSIS — W19XXXA Unspecified fall, initial encounter: Secondary | ICD-10-CM | POA: Diagnosis not present

## 2021-03-07 DIAGNOSIS — R269 Unspecified abnormalities of gait and mobility: Secondary | ICD-10-CM | POA: Diagnosis not present

## 2021-03-12 DIAGNOSIS — F0280 Dementia in other diseases classified elsewhere without behavioral disturbance: Secondary | ICD-10-CM | POA: Diagnosis not present

## 2021-03-12 DIAGNOSIS — G40209 Localization-related (focal) (partial) symptomatic epilepsy and epileptic syndromes with complex partial seizures, not intractable, without status epilepticus: Secondary | ICD-10-CM | POA: Diagnosis not present

## 2021-03-12 DIAGNOSIS — H9190 Unspecified hearing loss, unspecified ear: Secondary | ICD-10-CM | POA: Diagnosis not present

## 2021-03-12 DIAGNOSIS — E1151 Type 2 diabetes mellitus with diabetic peripheral angiopathy without gangrene: Secondary | ICD-10-CM | POA: Diagnosis not present

## 2021-03-12 DIAGNOSIS — J690 Pneumonitis due to inhalation of food and vomit: Secondary | ICD-10-CM | POA: Diagnosis not present

## 2021-03-12 DIAGNOSIS — G309 Alzheimer's disease, unspecified: Secondary | ICD-10-CM | POA: Diagnosis not present

## 2021-03-18 DIAGNOSIS — F419 Anxiety disorder, unspecified: Secondary | ICD-10-CM | POA: Diagnosis not present

## 2021-03-18 DIAGNOSIS — F03918 Unspecified dementia, unspecified severity, with other behavioral disturbance: Secondary | ICD-10-CM | POA: Diagnosis not present

## 2021-03-18 DIAGNOSIS — F5102 Adjustment insomnia: Secondary | ICD-10-CM | POA: Diagnosis not present

## 2021-03-19 DIAGNOSIS — H9190 Unspecified hearing loss, unspecified ear: Secondary | ICD-10-CM | POA: Diagnosis not present

## 2021-03-19 DIAGNOSIS — J690 Pneumonitis due to inhalation of food and vomit: Secondary | ICD-10-CM | POA: Diagnosis not present

## 2021-03-19 DIAGNOSIS — E1151 Type 2 diabetes mellitus with diabetic peripheral angiopathy without gangrene: Secondary | ICD-10-CM | POA: Diagnosis not present

## 2021-03-19 DIAGNOSIS — G40209 Localization-related (focal) (partial) symptomatic epilepsy and epileptic syndromes with complex partial seizures, not intractable, without status epilepticus: Secondary | ICD-10-CM | POA: Diagnosis not present

## 2021-03-19 DIAGNOSIS — G309 Alzheimer's disease, unspecified: Secondary | ICD-10-CM | POA: Diagnosis not present

## 2021-03-19 DIAGNOSIS — F0280 Dementia in other diseases classified elsewhere without behavioral disturbance: Secondary | ICD-10-CM | POA: Diagnosis not present

## 2021-03-26 DIAGNOSIS — F0280 Dementia in other diseases classified elsewhere without behavioral disturbance: Secondary | ICD-10-CM | POA: Diagnosis not present

## 2021-03-26 DIAGNOSIS — H9190 Unspecified hearing loss, unspecified ear: Secondary | ICD-10-CM | POA: Diagnosis not present

## 2021-03-26 DIAGNOSIS — J690 Pneumonitis due to inhalation of food and vomit: Secondary | ICD-10-CM | POA: Diagnosis not present

## 2021-03-26 DIAGNOSIS — G40209 Localization-related (focal) (partial) symptomatic epilepsy and epileptic syndromes with complex partial seizures, not intractable, without status epilepticus: Secondary | ICD-10-CM | POA: Diagnosis not present

## 2021-03-26 DIAGNOSIS — G309 Alzheimer's disease, unspecified: Secondary | ICD-10-CM | POA: Diagnosis not present

## 2021-03-26 DIAGNOSIS — E1151 Type 2 diabetes mellitus with diabetic peripheral angiopathy without gangrene: Secondary | ICD-10-CM | POA: Diagnosis not present

## 2021-04-02 DIAGNOSIS — R1310 Dysphagia, unspecified: Secondary | ICD-10-CM | POA: Diagnosis not present

## 2021-04-02 DIAGNOSIS — G309 Alzheimer's disease, unspecified: Secondary | ICD-10-CM | POA: Diagnosis not present

## 2021-04-02 DIAGNOSIS — G40209 Localization-related (focal) (partial) symptomatic epilepsy and epileptic syndromes with complex partial seizures, not intractable, without status epilepticus: Secondary | ICD-10-CM | POA: Diagnosis not present

## 2021-04-02 DIAGNOSIS — R634 Abnormal weight loss: Secondary | ICD-10-CM | POA: Diagnosis not present

## 2021-04-02 DIAGNOSIS — H9190 Unspecified hearing loss, unspecified ear: Secondary | ICD-10-CM | POA: Diagnosis not present

## 2021-04-02 DIAGNOSIS — F0280 Dementia in other diseases classified elsewhere without behavioral disturbance: Secondary | ICD-10-CM | POA: Diagnosis not present

## 2021-04-02 DIAGNOSIS — E1151 Type 2 diabetes mellitus with diabetic peripheral angiopathy without gangrene: Secondary | ICD-10-CM | POA: Diagnosis not present

## 2021-04-02 DIAGNOSIS — J690 Pneumonitis due to inhalation of food and vomit: Secondary | ICD-10-CM | POA: Diagnosis not present

## 2021-04-04 DIAGNOSIS — W06XXXA Fall from bed, initial encounter: Secondary | ICD-10-CM | POA: Diagnosis not present

## 2021-04-04 DIAGNOSIS — Z9181 History of falling: Secondary | ICD-10-CM | POA: Diagnosis not present

## 2021-04-04 DIAGNOSIS — M6281 Muscle weakness (generalized): Secondary | ICD-10-CM | POA: Diagnosis not present

## 2021-04-04 DIAGNOSIS — S51012A Laceration without foreign body of left elbow, initial encounter: Secondary | ICD-10-CM | POA: Diagnosis not present

## 2021-04-04 DIAGNOSIS — R2689 Other abnormalities of gait and mobility: Secondary | ICD-10-CM | POA: Diagnosis not present

## 2021-04-04 DIAGNOSIS — G309 Alzheimer's disease, unspecified: Secondary | ICD-10-CM | POA: Diagnosis not present

## 2021-04-04 DIAGNOSIS — R269 Unspecified abnormalities of gait and mobility: Secondary | ICD-10-CM | POA: Diagnosis not present

## 2021-04-07 DIAGNOSIS — Z9181 History of falling: Secondary | ICD-10-CM | POA: Diagnosis not present

## 2021-04-07 DIAGNOSIS — M6281 Muscle weakness (generalized): Secondary | ICD-10-CM | POA: Diagnosis not present

## 2021-04-07 DIAGNOSIS — S0083XA Contusion of other part of head, initial encounter: Secondary | ICD-10-CM | POA: Diagnosis not present

## 2021-04-07 DIAGNOSIS — W19XXXS Unspecified fall, sequela: Secondary | ICD-10-CM | POA: Diagnosis not present

## 2021-04-07 DIAGNOSIS — R269 Unspecified abnormalities of gait and mobility: Secondary | ICD-10-CM | POA: Diagnosis not present

## 2021-04-07 DIAGNOSIS — G309 Alzheimer's disease, unspecified: Secondary | ICD-10-CM | POA: Diagnosis not present

## 2021-04-15 DIAGNOSIS — F419 Anxiety disorder, unspecified: Secondary | ICD-10-CM | POA: Diagnosis not present

## 2021-04-15 DIAGNOSIS — F03918 Unspecified dementia, unspecified severity, with other behavioral disturbance: Secondary | ICD-10-CM | POA: Diagnosis not present

## 2021-04-15 DIAGNOSIS — F5102 Adjustment insomnia: Secondary | ICD-10-CM | POA: Diagnosis not present

## 2021-04-25 ENCOUNTER — Encounter (HOSPITAL_COMMUNITY): Payer: Self-pay | Admitting: Radiology

## 2021-04-28 DIAGNOSIS — Z6826 Body mass index (BMI) 26.0-26.9, adult: Secondary | ICD-10-CM | POA: Diagnosis not present

## 2021-04-28 DIAGNOSIS — R1310 Dysphagia, unspecified: Secondary | ICD-10-CM | POA: Diagnosis not present

## 2021-04-28 DIAGNOSIS — R634 Abnormal weight loss: Secondary | ICD-10-CM | POA: Diagnosis not present

## 2021-04-28 DIAGNOSIS — G309 Alzheimer's disease, unspecified: Secondary | ICD-10-CM | POA: Diagnosis not present

## 2021-05-07 DIAGNOSIS — N39498 Other specified urinary incontinence: Secondary | ICD-10-CM | POA: Diagnosis not present

## 2021-05-07 DIAGNOSIS — G309 Alzheimer's disease, unspecified: Secondary | ICD-10-CM | POA: Diagnosis not present

## 2021-05-07 DIAGNOSIS — R319 Hematuria, unspecified: Secondary | ICD-10-CM | POA: Diagnosis not present

## 2021-05-08 DIAGNOSIS — R895 Abnormal microbiological findings in specimens from other organs, systems and tissues: Secondary | ICD-10-CM | POA: Diagnosis not present

## 2021-05-08 DIAGNOSIS — N390 Urinary tract infection, site not specified: Secondary | ICD-10-CM | POA: Diagnosis not present

## 2021-05-09 DIAGNOSIS — L609 Nail disorder, unspecified: Secondary | ICD-10-CM | POA: Diagnosis not present

## 2021-05-10 DIAGNOSIS — N390 Urinary tract infection, site not specified: Secondary | ICD-10-CM | POA: Diagnosis not present

## 2021-05-13 DIAGNOSIS — F03918 Unspecified dementia, unspecified severity, with other behavioral disturbance: Secondary | ICD-10-CM | POA: Diagnosis not present

## 2021-05-13 DIAGNOSIS — F5102 Adjustment insomnia: Secondary | ICD-10-CM | POA: Diagnosis not present

## 2021-05-13 DIAGNOSIS — F419 Anxiety disorder, unspecified: Secondary | ICD-10-CM | POA: Diagnosis not present

## 2021-05-15 DIAGNOSIS — I119 Hypertensive heart disease without heart failure: Secondary | ICD-10-CM | POA: Diagnosis not present

## 2021-05-15 DIAGNOSIS — G309 Alzheimer's disease, unspecified: Secondary | ICD-10-CM | POA: Diagnosis not present

## 2021-05-15 DIAGNOSIS — N390 Urinary tract infection, site not specified: Secondary | ICD-10-CM | POA: Diagnosis not present

## 2021-05-15 DIAGNOSIS — I471 Supraventricular tachycardia: Secondary | ICD-10-CM | POA: Diagnosis not present

## 2021-06-06 DIAGNOSIS — I2510 Atherosclerotic heart disease of native coronary artery without angina pectoris: Secondary | ICD-10-CM | POA: Diagnosis not present

## 2021-06-06 DIAGNOSIS — I471 Supraventricular tachycardia: Secondary | ICD-10-CM | POA: Diagnosis not present

## 2021-06-06 DIAGNOSIS — I739 Peripheral vascular disease, unspecified: Secondary | ICD-10-CM | POA: Diagnosis not present

## 2021-06-06 DIAGNOSIS — I119 Hypertensive heart disease without heart failure: Secondary | ICD-10-CM | POA: Diagnosis not present

## 2021-06-06 DIAGNOSIS — E1151 Type 2 diabetes mellitus with diabetic peripheral angiopathy without gangrene: Secondary | ICD-10-CM | POA: Diagnosis not present

## 2021-06-06 DIAGNOSIS — G309 Alzheimer's disease, unspecified: Secondary | ICD-10-CM | POA: Diagnosis not present

## 2021-06-09 DIAGNOSIS — D649 Anemia, unspecified: Secondary | ICD-10-CM | POA: Diagnosis not present

## 2021-06-09 DIAGNOSIS — E119 Type 2 diabetes mellitus without complications: Secondary | ICD-10-CM | POA: Diagnosis not present

## 2021-06-09 DIAGNOSIS — I10 Essential (primary) hypertension: Secondary | ICD-10-CM | POA: Diagnosis not present

## 2021-06-10 DIAGNOSIS — F419 Anxiety disorder, unspecified: Secondary | ICD-10-CM | POA: Diagnosis not present

## 2021-06-10 DIAGNOSIS — F03918 Unspecified dementia, unspecified severity, with other behavioral disturbance: Secondary | ICD-10-CM | POA: Diagnosis not present

## 2021-06-10 DIAGNOSIS — F5102 Adjustment insomnia: Secondary | ICD-10-CM | POA: Diagnosis not present

## 2021-06-11 DIAGNOSIS — W19XXXA Unspecified fall, initial encounter: Secondary | ICD-10-CM | POA: Diagnosis not present

## 2021-06-11 DIAGNOSIS — R269 Unspecified abnormalities of gait and mobility: Secondary | ICD-10-CM | POA: Diagnosis not present

## 2021-06-11 DIAGNOSIS — G309 Alzheimer's disease, unspecified: Secondary | ICD-10-CM | POA: Diagnosis not present

## 2021-06-11 DIAGNOSIS — Z9181 History of falling: Secondary | ICD-10-CM | POA: Diagnosis not present

## 2021-06-11 DIAGNOSIS — M6281 Muscle weakness (generalized): Secondary | ICD-10-CM | POA: Diagnosis not present

## 2021-06-11 DIAGNOSIS — R2689 Other abnormalities of gait and mobility: Secondary | ICD-10-CM | POA: Diagnosis not present

## 2021-06-12 DIAGNOSIS — D649 Anemia, unspecified: Secondary | ICD-10-CM | POA: Diagnosis not present

## 2021-06-12 DIAGNOSIS — W19XXXA Unspecified fall, initial encounter: Secondary | ICD-10-CM | POA: Diagnosis not present

## 2021-06-12 DIAGNOSIS — Z9181 History of falling: Secondary | ICD-10-CM | POA: Diagnosis not present

## 2021-06-12 DIAGNOSIS — G309 Alzheimer's disease, unspecified: Secondary | ICD-10-CM | POA: Diagnosis not present

## 2021-06-12 DIAGNOSIS — E119 Type 2 diabetes mellitus without complications: Secondary | ICD-10-CM | POA: Diagnosis not present

## 2021-06-12 DIAGNOSIS — R269 Unspecified abnormalities of gait and mobility: Secondary | ICD-10-CM | POA: Diagnosis not present

## 2021-06-12 DIAGNOSIS — M6281 Muscle weakness (generalized): Secondary | ICD-10-CM | POA: Diagnosis not present

## 2021-06-18 DIAGNOSIS — R4182 Altered mental status, unspecified: Secondary | ICD-10-CM | POA: Diagnosis not present

## 2021-06-18 DIAGNOSIS — R778 Other specified abnormalities of plasma proteins: Secondary | ICD-10-CM | POA: Diagnosis not present

## 2021-06-18 DIAGNOSIS — R531 Weakness: Secondary | ICD-10-CM | POA: Diagnosis not present

## 2021-06-18 DIAGNOSIS — U071 COVID-19: Secondary | ICD-10-CM | POA: Diagnosis not present

## 2021-06-18 DIAGNOSIS — R55 Syncope and collapse: Secondary | ICD-10-CM | POA: Diagnosis not present

## 2021-06-18 DIAGNOSIS — I10 Essential (primary) hypertension: Secondary | ICD-10-CM | POA: Diagnosis not present

## 2021-06-18 DIAGNOSIS — F0390 Unspecified dementia without behavioral disturbance: Secondary | ICD-10-CM | POA: Diagnosis not present

## 2021-06-18 DIAGNOSIS — G309 Alzheimer's disease, unspecified: Secondary | ICD-10-CM | POA: Diagnosis not present

## 2021-06-18 DIAGNOSIS — R059 Cough, unspecified: Secondary | ICD-10-CM | POA: Diagnosis not present

## 2021-06-18 DIAGNOSIS — R0989 Other specified symptoms and signs involving the circulatory and respiratory systems: Secondary | ICD-10-CM | POA: Diagnosis not present

## 2021-06-18 DIAGNOSIS — R000 Tachycardia, unspecified: Secondary | ICD-10-CM | POA: Diagnosis not present

## 2021-06-18 DIAGNOSIS — R509 Fever, unspecified: Secondary | ICD-10-CM | POA: Diagnosis not present

## 2021-06-19 DIAGNOSIS — E785 Hyperlipidemia, unspecified: Secondary | ICD-10-CM | POA: Diagnosis not present

## 2021-06-19 DIAGNOSIS — R778 Other specified abnormalities of plasma proteins: Secondary | ICD-10-CM | POA: Diagnosis not present

## 2021-06-19 DIAGNOSIS — F0390 Unspecified dementia without behavioral disturbance: Secondary | ICD-10-CM | POA: Diagnosis not present

## 2021-06-19 DIAGNOSIS — I252 Old myocardial infarction: Secondary | ICD-10-CM | POA: Diagnosis not present

## 2021-06-19 DIAGNOSIS — I10 Essential (primary) hypertension: Secondary | ICD-10-CM | POA: Diagnosis not present

## 2021-06-19 DIAGNOSIS — R531 Weakness: Secondary | ICD-10-CM | POA: Diagnosis not present

## 2021-06-19 DIAGNOSIS — R4182 Altered mental status, unspecified: Secondary | ICD-10-CM | POA: Diagnosis not present

## 2021-06-19 DIAGNOSIS — Z79899 Other long term (current) drug therapy: Secondary | ICD-10-CM | POA: Diagnosis not present

## 2021-06-19 DIAGNOSIS — Z7982 Long term (current) use of aspirin: Secondary | ICD-10-CM | POA: Diagnosis not present

## 2021-06-19 DIAGNOSIS — G9341 Metabolic encephalopathy: Secondary | ICD-10-CM | POA: Diagnosis not present

## 2021-06-19 DIAGNOSIS — E860 Dehydration: Secondary | ICD-10-CM | POA: Diagnosis not present

## 2021-06-19 DIAGNOSIS — Z743 Need for continuous supervision: Secondary | ICD-10-CM | POA: Diagnosis not present

## 2021-06-19 DIAGNOSIS — R55 Syncope and collapse: Secondary | ICD-10-CM | POA: Diagnosis not present

## 2021-06-19 DIAGNOSIS — Z87891 Personal history of nicotine dependence: Secondary | ICD-10-CM | POA: Diagnosis not present

## 2021-06-19 DIAGNOSIS — U071 COVID-19: Secondary | ICD-10-CM | POA: Diagnosis not present

## 2021-06-25 DIAGNOSIS — R569 Unspecified convulsions: Secondary | ICD-10-CM | POA: Diagnosis not present

## 2021-06-25 DIAGNOSIS — U071 COVID-19: Secondary | ICD-10-CM | POA: Diagnosis not present

## 2021-06-25 DIAGNOSIS — F0390 Unspecified dementia without behavioral disturbance: Secondary | ICD-10-CM | POA: Diagnosis not present

## 2021-06-25 DIAGNOSIS — E860 Dehydration: Secondary | ICD-10-CM | POA: Diagnosis not present

## 2021-06-25 DIAGNOSIS — R531 Weakness: Secondary | ICD-10-CM | POA: Diagnosis not present

## 2021-06-25 DIAGNOSIS — R269 Unspecified abnormalities of gait and mobility: Secondary | ICD-10-CM | POA: Diagnosis not present

## 2021-06-25 DIAGNOSIS — I10 Essential (primary) hypertension: Secondary | ICD-10-CM | POA: Diagnosis not present

## 2021-06-25 DIAGNOSIS — M6281 Muscle weakness (generalized): Secondary | ICD-10-CM | POA: Diagnosis not present

## 2021-06-25 DIAGNOSIS — E119 Type 2 diabetes mellitus without complications: Secondary | ICD-10-CM | POA: Diagnosis not present

## 2021-06-25 DIAGNOSIS — G9341 Metabolic encephalopathy: Secondary | ICD-10-CM | POA: Diagnosis not present

## 2021-07-02 DIAGNOSIS — R1310 Dysphagia, unspecified: Secondary | ICD-10-CM | POA: Diagnosis not present

## 2021-07-02 DIAGNOSIS — G309 Alzheimer's disease, unspecified: Secondary | ICD-10-CM | POA: Diagnosis not present

## 2021-07-04 DIAGNOSIS — F0390 Unspecified dementia without behavioral disturbance: Secondary | ICD-10-CM | POA: Diagnosis not present

## 2021-07-04 DIAGNOSIS — R2689 Other abnormalities of gait and mobility: Secondary | ICD-10-CM | POA: Diagnosis not present

## 2021-07-04 DIAGNOSIS — Z9181 History of falling: Secondary | ICD-10-CM | POA: Diagnosis not present

## 2021-07-04 DIAGNOSIS — R269 Unspecified abnormalities of gait and mobility: Secondary | ICD-10-CM | POA: Diagnosis not present

## 2021-07-04 DIAGNOSIS — W19XXXA Unspecified fall, initial encounter: Secondary | ICD-10-CM | POA: Diagnosis not present

## 2021-07-04 DIAGNOSIS — M6281 Muscle weakness (generalized): Secondary | ICD-10-CM | POA: Diagnosis not present

## 2021-07-15 DIAGNOSIS — F5102 Adjustment insomnia: Secondary | ICD-10-CM | POA: Diagnosis not present

## 2021-07-15 DIAGNOSIS — F03918 Unspecified dementia, unspecified severity, with other behavioral disturbance: Secondary | ICD-10-CM | POA: Diagnosis not present

## 2021-07-15 DIAGNOSIS — F419 Anxiety disorder, unspecified: Secondary | ICD-10-CM | POA: Diagnosis not present

## 2021-07-21 DIAGNOSIS — R262 Difficulty in walking, not elsewhere classified: Secondary | ICD-10-CM | POA: Diagnosis not present

## 2021-07-21 DIAGNOSIS — M6259 Muscle wasting and atrophy, not elsewhere classified, multiple sites: Secondary | ICD-10-CM | POA: Diagnosis not present

## 2021-07-21 DIAGNOSIS — M6281 Muscle weakness (generalized): Secondary | ICD-10-CM | POA: Diagnosis not present

## 2021-07-21 DIAGNOSIS — R2681 Unsteadiness on feet: Secondary | ICD-10-CM | POA: Diagnosis not present

## 2021-07-21 DIAGNOSIS — R296 Repeated falls: Secondary | ICD-10-CM | POA: Diagnosis not present

## 2021-07-21 DIAGNOSIS — I472 Ventricular tachycardia, unspecified: Secondary | ICD-10-CM | POA: Diagnosis not present

## 2021-07-22 DIAGNOSIS — M6281 Muscle weakness (generalized): Secondary | ICD-10-CM | POA: Diagnosis not present

## 2021-07-22 DIAGNOSIS — E785 Hyperlipidemia, unspecified: Secondary | ICD-10-CM | POA: Diagnosis not present

## 2021-07-22 DIAGNOSIS — M6259 Muscle wasting and atrophy, not elsewhere classified, multiple sites: Secondary | ICD-10-CM | POA: Diagnosis not present

## 2021-07-22 DIAGNOSIS — R41841 Cognitive communication deficit: Secondary | ICD-10-CM | POA: Diagnosis not present

## 2021-07-22 DIAGNOSIS — E119 Type 2 diabetes mellitus without complications: Secondary | ICD-10-CM | POA: Diagnosis not present

## 2021-07-22 DIAGNOSIS — F419 Anxiety disorder, unspecified: Secondary | ICD-10-CM | POA: Diagnosis not present

## 2021-07-22 DIAGNOSIS — F339 Major depressive disorder, recurrent, unspecified: Secondary | ICD-10-CM | POA: Diagnosis not present

## 2021-07-22 DIAGNOSIS — G309 Alzheimer's disease, unspecified: Secondary | ICD-10-CM | POA: Diagnosis not present

## 2021-07-22 DIAGNOSIS — R2681 Unsteadiness on feet: Secondary | ICD-10-CM | POA: Diagnosis not present

## 2021-07-22 DIAGNOSIS — R1312 Dysphagia, oropharyngeal phase: Secondary | ICD-10-CM | POA: Diagnosis not present

## 2021-07-22 DIAGNOSIS — G4089 Other seizures: Secondary | ICD-10-CM | POA: Diagnosis not present

## 2021-07-22 DIAGNOSIS — G9340 Encephalopathy, unspecified: Secondary | ICD-10-CM | POA: Diagnosis not present

## 2021-07-22 DIAGNOSIS — I472 Ventricular tachycardia, unspecified: Secondary | ICD-10-CM | POA: Diagnosis not present

## 2021-07-22 DIAGNOSIS — R262 Difficulty in walking, not elsewhere classified: Secondary | ICD-10-CM | POA: Diagnosis not present

## 2021-07-23 ENCOUNTER — Ambulatory Visit: Payer: Medicare Other | Admitting: Neurology

## 2021-07-23 DIAGNOSIS — R296 Repeated falls: Secondary | ICD-10-CM | POA: Diagnosis not present

## 2021-07-23 DIAGNOSIS — M6281 Muscle weakness (generalized): Secondary | ICD-10-CM | POA: Diagnosis not present

## 2021-07-24 DIAGNOSIS — M6281 Muscle weakness (generalized): Secondary | ICD-10-CM | POA: Diagnosis not present

## 2021-07-24 DIAGNOSIS — R296 Repeated falls: Secondary | ICD-10-CM | POA: Diagnosis not present

## 2021-07-29 DIAGNOSIS — M6259 Muscle wasting and atrophy, not elsewhere classified, multiple sites: Secondary | ICD-10-CM | POA: Diagnosis not present

## 2021-07-29 DIAGNOSIS — M6281 Muscle weakness (generalized): Secondary | ICD-10-CM | POA: Diagnosis not present

## 2021-07-29 DIAGNOSIS — R2681 Unsteadiness on feet: Secondary | ICD-10-CM | POA: Diagnosis not present

## 2021-07-29 DIAGNOSIS — R41841 Cognitive communication deficit: Secondary | ICD-10-CM | POA: Diagnosis not present

## 2021-07-29 DIAGNOSIS — R262 Difficulty in walking, not elsewhere classified: Secondary | ICD-10-CM | POA: Diagnosis not present

## 2021-07-29 DIAGNOSIS — R296 Repeated falls: Secondary | ICD-10-CM | POA: Diagnosis not present

## 2021-07-29 DIAGNOSIS — E785 Hyperlipidemia, unspecified: Secondary | ICD-10-CM | POA: Diagnosis not present

## 2021-07-29 DIAGNOSIS — G4089 Other seizures: Secondary | ICD-10-CM | POA: Diagnosis not present

## 2021-07-29 DIAGNOSIS — F419 Anxiety disorder, unspecified: Secondary | ICD-10-CM | POA: Diagnosis not present

## 2021-07-29 DIAGNOSIS — R1312 Dysphagia, oropharyngeal phase: Secondary | ICD-10-CM | POA: Diagnosis not present

## 2021-07-29 DIAGNOSIS — F339 Major depressive disorder, recurrent, unspecified: Secondary | ICD-10-CM | POA: Diagnosis not present

## 2021-07-29 DIAGNOSIS — G309 Alzheimer's disease, unspecified: Secondary | ICD-10-CM | POA: Diagnosis not present

## 2021-07-29 DIAGNOSIS — E119 Type 2 diabetes mellitus without complications: Secondary | ICD-10-CM | POA: Diagnosis not present

## 2021-07-29 DIAGNOSIS — I472 Ventricular tachycardia, unspecified: Secondary | ICD-10-CM | POA: Diagnosis not present

## 2021-07-29 DIAGNOSIS — G9340 Encephalopathy, unspecified: Secondary | ICD-10-CM | POA: Diagnosis not present

## 2021-07-31 DIAGNOSIS — R2681 Unsteadiness on feet: Secondary | ICD-10-CM | POA: Diagnosis not present

## 2021-07-31 DIAGNOSIS — R296 Repeated falls: Secondary | ICD-10-CM | POA: Diagnosis not present

## 2021-07-31 DIAGNOSIS — R262 Difficulty in walking, not elsewhere classified: Secondary | ICD-10-CM | POA: Diagnosis not present

## 2021-07-31 DIAGNOSIS — I472 Ventricular tachycardia, unspecified: Secondary | ICD-10-CM | POA: Diagnosis not present

## 2021-07-31 DIAGNOSIS — M6259 Muscle wasting and atrophy, not elsewhere classified, multiple sites: Secondary | ICD-10-CM | POA: Diagnosis not present

## 2021-07-31 DIAGNOSIS — M6281 Muscle weakness (generalized): Secondary | ICD-10-CM | POA: Diagnosis not present

## 2021-08-01 DIAGNOSIS — R269 Unspecified abnormalities of gait and mobility: Secondary | ICD-10-CM | POA: Diagnosis not present

## 2021-08-01 DIAGNOSIS — F0390 Unspecified dementia without behavioral disturbance: Secondary | ICD-10-CM | POA: Diagnosis not present

## 2021-08-01 DIAGNOSIS — Z9181 History of falling: Secondary | ICD-10-CM | POA: Diagnosis not present

## 2021-08-01 DIAGNOSIS — S0083XA Contusion of other part of head, initial encounter: Secondary | ICD-10-CM | POA: Diagnosis not present

## 2021-08-01 DIAGNOSIS — R2689 Other abnormalities of gait and mobility: Secondary | ICD-10-CM | POA: Diagnosis not present

## 2021-08-01 DIAGNOSIS — M6281 Muscle weakness (generalized): Secondary | ICD-10-CM | POA: Diagnosis not present

## 2021-08-01 DIAGNOSIS — W19XXXA Unspecified fall, initial encounter: Secondary | ICD-10-CM | POA: Diagnosis not present

## 2021-08-04 DIAGNOSIS — R296 Repeated falls: Secondary | ICD-10-CM | POA: Diagnosis not present

## 2021-08-04 DIAGNOSIS — M6281 Muscle weakness (generalized): Secondary | ICD-10-CM | POA: Diagnosis not present

## 2021-08-05 DIAGNOSIS — G9340 Encephalopathy, unspecified: Secondary | ICD-10-CM | POA: Diagnosis not present

## 2021-08-05 DIAGNOSIS — F419 Anxiety disorder, unspecified: Secondary | ICD-10-CM | POA: Diagnosis not present

## 2021-08-05 DIAGNOSIS — G309 Alzheimer's disease, unspecified: Secondary | ICD-10-CM | POA: Diagnosis not present

## 2021-08-05 DIAGNOSIS — R1312 Dysphagia, oropharyngeal phase: Secondary | ICD-10-CM | POA: Diagnosis not present

## 2021-08-05 DIAGNOSIS — E119 Type 2 diabetes mellitus without complications: Secondary | ICD-10-CM | POA: Diagnosis not present

## 2021-08-05 DIAGNOSIS — R41841 Cognitive communication deficit: Secondary | ICD-10-CM | POA: Diagnosis not present

## 2021-08-05 DIAGNOSIS — E785 Hyperlipidemia, unspecified: Secondary | ICD-10-CM | POA: Diagnosis not present

## 2021-08-05 DIAGNOSIS — M6281 Muscle weakness (generalized): Secondary | ICD-10-CM | POA: Diagnosis not present

## 2021-08-05 DIAGNOSIS — F339 Major depressive disorder, recurrent, unspecified: Secondary | ICD-10-CM | POA: Diagnosis not present

## 2021-08-05 DIAGNOSIS — G4089 Other seizures: Secondary | ICD-10-CM | POA: Diagnosis not present

## 2021-08-06 DIAGNOSIS — R2681 Unsteadiness on feet: Secondary | ICD-10-CM | POA: Diagnosis not present

## 2021-08-06 DIAGNOSIS — M6259 Muscle wasting and atrophy, not elsewhere classified, multiple sites: Secondary | ICD-10-CM | POA: Diagnosis not present

## 2021-08-06 DIAGNOSIS — F339 Major depressive disorder, recurrent, unspecified: Secondary | ICD-10-CM | POA: Diagnosis not present

## 2021-08-06 DIAGNOSIS — F419 Anxiety disorder, unspecified: Secondary | ICD-10-CM | POA: Diagnosis not present

## 2021-08-06 DIAGNOSIS — G4089 Other seizures: Secondary | ICD-10-CM | POA: Diagnosis not present

## 2021-08-06 DIAGNOSIS — R41841 Cognitive communication deficit: Secondary | ICD-10-CM | POA: Diagnosis not present

## 2021-08-06 DIAGNOSIS — R262 Difficulty in walking, not elsewhere classified: Secondary | ICD-10-CM | POA: Diagnosis not present

## 2021-08-06 DIAGNOSIS — G9340 Encephalopathy, unspecified: Secondary | ICD-10-CM | POA: Diagnosis not present

## 2021-08-06 DIAGNOSIS — E785 Hyperlipidemia, unspecified: Secondary | ICD-10-CM | POA: Diagnosis not present

## 2021-08-06 DIAGNOSIS — R1312 Dysphagia, oropharyngeal phase: Secondary | ICD-10-CM | POA: Diagnosis not present

## 2021-08-06 DIAGNOSIS — E119 Type 2 diabetes mellitus without complications: Secondary | ICD-10-CM | POA: Diagnosis not present

## 2021-08-06 DIAGNOSIS — I472 Ventricular tachycardia, unspecified: Secondary | ICD-10-CM | POA: Diagnosis not present

## 2021-08-06 DIAGNOSIS — M6281 Muscle weakness (generalized): Secondary | ICD-10-CM | POA: Diagnosis not present

## 2021-08-06 DIAGNOSIS — G309 Alzheimer's disease, unspecified: Secondary | ICD-10-CM | POA: Diagnosis not present

## 2021-08-07 DIAGNOSIS — R296 Repeated falls: Secondary | ICD-10-CM | POA: Diagnosis not present

## 2021-08-07 DIAGNOSIS — M6281 Muscle weakness (generalized): Secondary | ICD-10-CM | POA: Diagnosis not present

## 2021-08-11 DIAGNOSIS — M6281 Muscle weakness (generalized): Secondary | ICD-10-CM | POA: Diagnosis not present

## 2021-08-11 DIAGNOSIS — R296 Repeated falls: Secondary | ICD-10-CM | POA: Diagnosis not present

## 2021-08-12 DIAGNOSIS — E119 Type 2 diabetes mellitus without complications: Secondary | ICD-10-CM | POA: Diagnosis not present

## 2021-08-12 DIAGNOSIS — G309 Alzheimer's disease, unspecified: Secondary | ICD-10-CM | POA: Diagnosis not present

## 2021-08-12 DIAGNOSIS — G9340 Encephalopathy, unspecified: Secondary | ICD-10-CM | POA: Diagnosis not present

## 2021-08-12 DIAGNOSIS — R1312 Dysphagia, oropharyngeal phase: Secondary | ICD-10-CM | POA: Diagnosis not present

## 2021-08-12 DIAGNOSIS — F419 Anxiety disorder, unspecified: Secondary | ICD-10-CM | POA: Diagnosis not present

## 2021-08-12 DIAGNOSIS — M6281 Muscle weakness (generalized): Secondary | ICD-10-CM | POA: Diagnosis not present

## 2021-08-12 DIAGNOSIS — F339 Major depressive disorder, recurrent, unspecified: Secondary | ICD-10-CM | POA: Diagnosis not present

## 2021-08-12 DIAGNOSIS — E785 Hyperlipidemia, unspecified: Secondary | ICD-10-CM | POA: Diagnosis not present

## 2021-08-12 DIAGNOSIS — F5102 Adjustment insomnia: Secondary | ICD-10-CM | POA: Diagnosis not present

## 2021-08-12 DIAGNOSIS — F03918 Unspecified dementia, unspecified severity, with other behavioral disturbance: Secondary | ICD-10-CM | POA: Diagnosis not present

## 2021-08-12 DIAGNOSIS — G4089 Other seizures: Secondary | ICD-10-CM | POA: Diagnosis not present

## 2021-08-12 DIAGNOSIS — R41841 Cognitive communication deficit: Secondary | ICD-10-CM | POA: Diagnosis not present

## 2021-08-13 DIAGNOSIS — R296 Repeated falls: Secondary | ICD-10-CM | POA: Diagnosis not present

## 2021-08-13 DIAGNOSIS — R41841 Cognitive communication deficit: Secondary | ICD-10-CM | POA: Diagnosis not present

## 2021-08-13 DIAGNOSIS — F419 Anxiety disorder, unspecified: Secondary | ICD-10-CM | POA: Diagnosis not present

## 2021-08-13 DIAGNOSIS — G9340 Encephalopathy, unspecified: Secondary | ICD-10-CM | POA: Diagnosis not present

## 2021-08-13 DIAGNOSIS — G309 Alzheimer's disease, unspecified: Secondary | ICD-10-CM | POA: Diagnosis not present

## 2021-08-13 DIAGNOSIS — E785 Hyperlipidemia, unspecified: Secondary | ICD-10-CM | POA: Diagnosis not present

## 2021-08-13 DIAGNOSIS — M6281 Muscle weakness (generalized): Secondary | ICD-10-CM | POA: Diagnosis not present

## 2021-08-13 DIAGNOSIS — G4089 Other seizures: Secondary | ICD-10-CM | POA: Diagnosis not present

## 2021-08-13 DIAGNOSIS — R1312 Dysphagia, oropharyngeal phase: Secondary | ICD-10-CM | POA: Diagnosis not present

## 2021-08-13 DIAGNOSIS — F339 Major depressive disorder, recurrent, unspecified: Secondary | ICD-10-CM | POA: Diagnosis not present

## 2021-08-13 DIAGNOSIS — E119 Type 2 diabetes mellitus without complications: Secondary | ICD-10-CM | POA: Diagnosis not present

## 2021-08-14 DIAGNOSIS — R262 Difficulty in walking, not elsewhere classified: Secondary | ICD-10-CM | POA: Diagnosis not present

## 2021-08-14 DIAGNOSIS — R2681 Unsteadiness on feet: Secondary | ICD-10-CM | POA: Diagnosis not present

## 2021-08-14 DIAGNOSIS — I472 Ventricular tachycardia, unspecified: Secondary | ICD-10-CM | POA: Diagnosis not present

## 2021-08-14 DIAGNOSIS — M6259 Muscle wasting and atrophy, not elsewhere classified, multiple sites: Secondary | ICD-10-CM | POA: Diagnosis not present

## 2021-08-15 DIAGNOSIS — E119 Type 2 diabetes mellitus without complications: Secondary | ICD-10-CM | POA: Diagnosis not present

## 2021-08-15 DIAGNOSIS — F419 Anxiety disorder, unspecified: Secondary | ICD-10-CM | POA: Diagnosis not present

## 2021-08-15 DIAGNOSIS — R41841 Cognitive communication deficit: Secondary | ICD-10-CM | POA: Diagnosis not present

## 2021-08-15 DIAGNOSIS — R1312 Dysphagia, oropharyngeal phase: Secondary | ICD-10-CM | POA: Diagnosis not present

## 2021-08-15 DIAGNOSIS — G309 Alzheimer's disease, unspecified: Secondary | ICD-10-CM | POA: Diagnosis not present

## 2021-08-15 DIAGNOSIS — M6281 Muscle weakness (generalized): Secondary | ICD-10-CM | POA: Diagnosis not present

## 2021-08-15 DIAGNOSIS — F339 Major depressive disorder, recurrent, unspecified: Secondary | ICD-10-CM | POA: Diagnosis not present

## 2021-08-15 DIAGNOSIS — G4089 Other seizures: Secondary | ICD-10-CM | POA: Diagnosis not present

## 2021-08-15 DIAGNOSIS — E785 Hyperlipidemia, unspecified: Secondary | ICD-10-CM | POA: Diagnosis not present

## 2021-08-15 DIAGNOSIS — G9340 Encephalopathy, unspecified: Secondary | ICD-10-CM | POA: Diagnosis not present

## 2021-08-15 DIAGNOSIS — R296 Repeated falls: Secondary | ICD-10-CM | POA: Diagnosis not present

## 2021-08-16 DIAGNOSIS — I472 Ventricular tachycardia, unspecified: Secondary | ICD-10-CM | POA: Diagnosis not present

## 2021-08-16 DIAGNOSIS — R2681 Unsteadiness on feet: Secondary | ICD-10-CM | POA: Diagnosis not present

## 2021-08-16 DIAGNOSIS — R262 Difficulty in walking, not elsewhere classified: Secondary | ICD-10-CM | POA: Diagnosis not present

## 2021-08-16 DIAGNOSIS — M6259 Muscle wasting and atrophy, not elsewhere classified, multiple sites: Secondary | ICD-10-CM | POA: Diagnosis not present

## 2021-08-18 DIAGNOSIS — R296 Repeated falls: Secondary | ICD-10-CM | POA: Diagnosis not present

## 2021-08-18 DIAGNOSIS — G4089 Other seizures: Secondary | ICD-10-CM | POA: Diagnosis not present

## 2021-08-18 DIAGNOSIS — F419 Anxiety disorder, unspecified: Secondary | ICD-10-CM | POA: Diagnosis not present

## 2021-08-18 DIAGNOSIS — G309 Alzheimer's disease, unspecified: Secondary | ICD-10-CM | POA: Diagnosis not present

## 2021-08-18 DIAGNOSIS — G9340 Encephalopathy, unspecified: Secondary | ICD-10-CM | POA: Diagnosis not present

## 2021-08-18 DIAGNOSIS — M6281 Muscle weakness (generalized): Secondary | ICD-10-CM | POA: Diagnosis not present

## 2021-08-18 DIAGNOSIS — R41841 Cognitive communication deficit: Secondary | ICD-10-CM | POA: Diagnosis not present

## 2021-08-18 DIAGNOSIS — E119 Type 2 diabetes mellitus without complications: Secondary | ICD-10-CM | POA: Diagnosis not present

## 2021-08-18 DIAGNOSIS — R1312 Dysphagia, oropharyngeal phase: Secondary | ICD-10-CM | POA: Diagnosis not present

## 2021-08-18 DIAGNOSIS — F339 Major depressive disorder, recurrent, unspecified: Secondary | ICD-10-CM | POA: Diagnosis not present

## 2021-08-18 DIAGNOSIS — E785 Hyperlipidemia, unspecified: Secondary | ICD-10-CM | POA: Diagnosis not present

## 2021-08-20 DIAGNOSIS — F419 Anxiety disorder, unspecified: Secondary | ICD-10-CM | POA: Diagnosis not present

## 2021-08-20 DIAGNOSIS — R262 Difficulty in walking, not elsewhere classified: Secondary | ICD-10-CM | POA: Diagnosis not present

## 2021-08-20 DIAGNOSIS — R1312 Dysphagia, oropharyngeal phase: Secondary | ICD-10-CM | POA: Diagnosis not present

## 2021-08-20 DIAGNOSIS — M6281 Muscle weakness (generalized): Secondary | ICD-10-CM | POA: Diagnosis not present

## 2021-08-20 DIAGNOSIS — G9340 Encephalopathy, unspecified: Secondary | ICD-10-CM | POA: Diagnosis not present

## 2021-08-20 DIAGNOSIS — F339 Major depressive disorder, recurrent, unspecified: Secondary | ICD-10-CM | POA: Diagnosis not present

## 2021-08-20 DIAGNOSIS — G4089 Other seizures: Secondary | ICD-10-CM | POA: Diagnosis not present

## 2021-08-20 DIAGNOSIS — R296 Repeated falls: Secondary | ICD-10-CM | POA: Diagnosis not present

## 2021-08-20 DIAGNOSIS — M6259 Muscle wasting and atrophy, not elsewhere classified, multiple sites: Secondary | ICD-10-CM | POA: Diagnosis not present

## 2021-08-20 DIAGNOSIS — R2681 Unsteadiness on feet: Secondary | ICD-10-CM | POA: Diagnosis not present

## 2021-08-20 DIAGNOSIS — E119 Type 2 diabetes mellitus without complications: Secondary | ICD-10-CM | POA: Diagnosis not present

## 2021-08-20 DIAGNOSIS — E785 Hyperlipidemia, unspecified: Secondary | ICD-10-CM | POA: Diagnosis not present

## 2021-08-20 DIAGNOSIS — R41841 Cognitive communication deficit: Secondary | ICD-10-CM | POA: Diagnosis not present

## 2021-08-20 DIAGNOSIS — G309 Alzheimer's disease, unspecified: Secondary | ICD-10-CM | POA: Diagnosis not present

## 2021-08-20 DIAGNOSIS — I472 Ventricular tachycardia, unspecified: Secondary | ICD-10-CM | POA: Diagnosis not present

## 2021-08-22 DIAGNOSIS — I472 Ventricular tachycardia, unspecified: Secondary | ICD-10-CM | POA: Diagnosis not present

## 2021-08-22 DIAGNOSIS — R2681 Unsteadiness on feet: Secondary | ICD-10-CM | POA: Diagnosis not present

## 2021-08-22 DIAGNOSIS — R296 Repeated falls: Secondary | ICD-10-CM | POA: Diagnosis not present

## 2021-08-22 DIAGNOSIS — R262 Difficulty in walking, not elsewhere classified: Secondary | ICD-10-CM | POA: Diagnosis not present

## 2021-08-22 DIAGNOSIS — M6281 Muscle weakness (generalized): Secondary | ICD-10-CM | POA: Diagnosis not present

## 2021-08-22 DIAGNOSIS — M6259 Muscle wasting and atrophy, not elsewhere classified, multiple sites: Secondary | ICD-10-CM | POA: Diagnosis not present

## 2021-08-23 DIAGNOSIS — R895 Abnormal microbiological findings in specimens from other organs, systems and tissues: Secondary | ICD-10-CM | POA: Diagnosis not present

## 2021-08-23 DIAGNOSIS — N390 Urinary tract infection, site not specified: Secondary | ICD-10-CM | POA: Diagnosis not present

## 2021-08-24 DIAGNOSIS — Z79899 Other long term (current) drug therapy: Secondary | ICD-10-CM | POA: Diagnosis not present

## 2021-08-24 DIAGNOSIS — Z7401 Bed confinement status: Secondary | ICD-10-CM | POA: Diagnosis not present

## 2021-08-24 DIAGNOSIS — R918 Other nonspecific abnormal finding of lung field: Secondary | ICD-10-CM | POA: Diagnosis not present

## 2021-08-24 DIAGNOSIS — N400 Enlarged prostate without lower urinary tract symptoms: Secondary | ICD-10-CM | POA: Diagnosis not present

## 2021-08-24 DIAGNOSIS — F419 Anxiety disorder, unspecified: Secondary | ICD-10-CM | POA: Diagnosis not present

## 2021-08-24 DIAGNOSIS — M6281 Muscle weakness (generalized): Secondary | ICD-10-CM | POA: Diagnosis not present

## 2021-08-24 DIAGNOSIS — F0390 Unspecified dementia without behavioral disturbance: Secondary | ICD-10-CM | POA: Diagnosis not present

## 2021-08-24 DIAGNOSIS — J811 Chronic pulmonary edema: Secondary | ICD-10-CM | POA: Diagnosis not present

## 2021-08-24 DIAGNOSIS — I2510 Atherosclerotic heart disease of native coronary artery without angina pectoris: Secondary | ICD-10-CM | POA: Diagnosis not present

## 2021-08-24 DIAGNOSIS — R509 Fever, unspecified: Secondary | ICD-10-CM | POA: Diagnosis not present

## 2021-08-24 DIAGNOSIS — B9562 Methicillin resistant Staphylococcus aureus infection as the cause of diseases classified elsewhere: Secondary | ICD-10-CM | POA: Diagnosis not present

## 2021-08-24 DIAGNOSIS — I6381 Other cerebral infarction due to occlusion or stenosis of small artery: Secondary | ICD-10-CM | POA: Diagnosis not present

## 2021-08-24 DIAGNOSIS — E1165 Type 2 diabetes mellitus with hyperglycemia: Secondary | ICD-10-CM | POA: Diagnosis not present

## 2021-08-24 DIAGNOSIS — K5730 Diverticulosis of large intestine without perforation or abscess without bleeding: Secondary | ICD-10-CM | POA: Diagnosis not present

## 2021-08-24 DIAGNOSIS — Z66 Do not resuscitate: Secondary | ICD-10-CM | POA: Diagnosis not present

## 2021-08-24 DIAGNOSIS — R911 Solitary pulmonary nodule: Secondary | ICD-10-CM | POA: Diagnosis not present

## 2021-08-24 DIAGNOSIS — J8410 Pulmonary fibrosis, unspecified: Secondary | ICD-10-CM | POA: Diagnosis not present

## 2021-08-24 DIAGNOSIS — Z7982 Long term (current) use of aspirin: Secondary | ICD-10-CM | POA: Diagnosis not present

## 2021-08-24 DIAGNOSIS — K429 Umbilical hernia without obstruction or gangrene: Secondary | ICD-10-CM | POA: Diagnosis not present

## 2021-08-24 DIAGNOSIS — E119 Type 2 diabetes mellitus without complications: Secondary | ICD-10-CM | POA: Diagnosis not present

## 2021-08-24 DIAGNOSIS — Z9181 History of falling: Secondary | ICD-10-CM | POA: Diagnosis not present

## 2021-08-24 DIAGNOSIS — Z87891 Personal history of nicotine dependence: Secondary | ICD-10-CM | POA: Diagnosis not present

## 2021-08-24 DIAGNOSIS — A419 Sepsis, unspecified organism: Secondary | ICD-10-CM | POA: Diagnosis not present

## 2021-08-24 DIAGNOSIS — R531 Weakness: Secondary | ICD-10-CM | POA: Diagnosis not present

## 2021-08-24 DIAGNOSIS — R0602 Shortness of breath: Secondary | ICD-10-CM | POA: Diagnosis not present

## 2021-08-24 DIAGNOSIS — N281 Cyst of kidney, acquired: Secondary | ICD-10-CM | POA: Diagnosis not present

## 2021-08-24 DIAGNOSIS — J189 Pneumonia, unspecified organism: Secondary | ICD-10-CM | POA: Diagnosis not present

## 2021-08-24 DIAGNOSIS — J690 Pneumonitis due to inhalation of food and vomit: Secondary | ICD-10-CM | POA: Diagnosis not present

## 2021-08-24 DIAGNOSIS — I10 Essential (primary) hypertension: Secondary | ICD-10-CM | POA: Diagnosis not present

## 2021-08-24 DIAGNOSIS — F32A Depression, unspecified: Secondary | ICD-10-CM | POA: Diagnosis not present

## 2021-08-24 DIAGNOSIS — G301 Alzheimer's disease with late onset: Secondary | ICD-10-CM | POA: Diagnosis not present

## 2021-08-24 DIAGNOSIS — R4181 Age-related cognitive decline: Secondary | ICD-10-CM | POA: Diagnosis not present

## 2021-08-24 DIAGNOSIS — I252 Old myocardial infarction: Secondary | ICD-10-CM | POA: Diagnosis not present

## 2021-08-24 DIAGNOSIS — N200 Calculus of kidney: Secondary | ICD-10-CM | POA: Diagnosis not present

## 2021-08-24 DIAGNOSIS — L03317 Cellulitis of buttock: Secondary | ICD-10-CM | POA: Diagnosis not present

## 2021-08-24 DIAGNOSIS — I471 Supraventricular tachycardia: Secondary | ICD-10-CM | POA: Diagnosis not present

## 2021-08-24 DIAGNOSIS — Z87442 Personal history of urinary calculi: Secondary | ICD-10-CM | POA: Diagnosis not present

## 2021-08-24 DIAGNOSIS — R739 Hyperglycemia, unspecified: Secondary | ICD-10-CM | POA: Diagnosis not present

## 2021-08-24 DIAGNOSIS — M2550 Pain in unspecified joint: Secondary | ICD-10-CM | POA: Diagnosis not present

## 2021-08-24 DIAGNOSIS — Z8701 Personal history of pneumonia (recurrent): Secondary | ICD-10-CM | POA: Diagnosis not present

## 2021-08-24 DIAGNOSIS — E876 Hypokalemia: Secondary | ICD-10-CM | POA: Diagnosis not present

## 2021-08-24 DIAGNOSIS — G40909 Epilepsy, unspecified, not intractable, without status epilepticus: Secondary | ICD-10-CM | POA: Diagnosis not present

## 2021-08-25 DIAGNOSIS — N281 Cyst of kidney, acquired: Secondary | ICD-10-CM | POA: Diagnosis not present

## 2021-08-25 DIAGNOSIS — I471 Supraventricular tachycardia: Secondary | ICD-10-CM | POA: Diagnosis not present

## 2021-08-25 DIAGNOSIS — J690 Pneumonitis due to inhalation of food and vomit: Secondary | ICD-10-CM | POA: Diagnosis not present

## 2021-08-25 DIAGNOSIS — A419 Sepsis, unspecified organism: Secondary | ICD-10-CM | POA: Diagnosis not present

## 2021-08-25 DIAGNOSIS — N400 Enlarged prostate without lower urinary tract symptoms: Secondary | ICD-10-CM | POA: Diagnosis not present

## 2021-08-25 DIAGNOSIS — L03317 Cellulitis of buttock: Secondary | ICD-10-CM | POA: Diagnosis not present

## 2021-08-26 DIAGNOSIS — A419 Sepsis, unspecified organism: Secondary | ICD-10-CM | POA: Diagnosis not present

## 2021-08-26 DIAGNOSIS — L03317 Cellulitis of buttock: Secondary | ICD-10-CM | POA: Diagnosis not present

## 2021-08-26 DIAGNOSIS — J690 Pneumonitis due to inhalation of food and vomit: Secondary | ICD-10-CM | POA: Diagnosis not present

## 2021-08-27 DIAGNOSIS — A419 Sepsis, unspecified organism: Secondary | ICD-10-CM | POA: Diagnosis not present

## 2021-08-27 DIAGNOSIS — J690 Pneumonitis due to inhalation of food and vomit: Secondary | ICD-10-CM | POA: Diagnosis not present

## 2021-08-27 DIAGNOSIS — L03317 Cellulitis of buttock: Secondary | ICD-10-CM | POA: Diagnosis not present

## 2021-08-28 DIAGNOSIS — R911 Solitary pulmonary nodule: Secondary | ICD-10-CM | POA: Diagnosis not present

## 2021-08-28 DIAGNOSIS — L03317 Cellulitis of buttock: Secondary | ICD-10-CM | POA: Diagnosis not present

## 2021-08-28 DIAGNOSIS — R0602 Shortness of breath: Secondary | ICD-10-CM | POA: Diagnosis not present

## 2021-08-28 DIAGNOSIS — J811 Chronic pulmonary edema: Secondary | ICD-10-CM | POA: Diagnosis not present

## 2021-08-28 DIAGNOSIS — J690 Pneumonitis due to inhalation of food and vomit: Secondary | ICD-10-CM | POA: Diagnosis not present

## 2021-08-28 DIAGNOSIS — A419 Sepsis, unspecified organism: Secondary | ICD-10-CM | POA: Diagnosis not present

## 2021-08-29 DIAGNOSIS — G301 Alzheimer's disease with late onset: Secondary | ICD-10-CM | POA: Diagnosis not present

## 2021-08-29 DIAGNOSIS — R4181 Age-related cognitive decline: Secondary | ICD-10-CM | POA: Diagnosis not present

## 2021-08-29 DIAGNOSIS — B9562 Methicillin resistant Staphylococcus aureus infection as the cause of diseases classified elsewhere: Secondary | ICD-10-CM | POA: Diagnosis not present

## 2021-08-29 DIAGNOSIS — J189 Pneumonia, unspecified organism: Secondary | ICD-10-CM | POA: Diagnosis not present

## 2021-08-29 DIAGNOSIS — I2510 Atherosclerotic heart disease of native coronary artery without angina pectoris: Secondary | ICD-10-CM | POA: Diagnosis not present

## 2021-08-29 DIAGNOSIS — M6281 Muscle weakness (generalized): Secondary | ICD-10-CM | POA: Diagnosis not present

## 2021-08-29 DIAGNOSIS — M2550 Pain in unspecified joint: Secondary | ICD-10-CM | POA: Diagnosis not present

## 2021-08-29 DIAGNOSIS — F0390 Unspecified dementia without behavioral disturbance: Secondary | ICD-10-CM | POA: Diagnosis not present

## 2021-08-29 DIAGNOSIS — L03317 Cellulitis of buttock: Secondary | ICD-10-CM | POA: Diagnosis not present

## 2021-08-29 DIAGNOSIS — J690 Pneumonitis due to inhalation of food and vomit: Secondary | ICD-10-CM | POA: Diagnosis not present

## 2021-08-29 DIAGNOSIS — A419 Sepsis, unspecified organism: Secondary | ICD-10-CM | POA: Diagnosis not present

## 2021-08-29 DIAGNOSIS — I10 Essential (primary) hypertension: Secondary | ICD-10-CM | POA: Diagnosis not present

## 2021-08-29 DIAGNOSIS — E119 Type 2 diabetes mellitus without complications: Secondary | ICD-10-CM | POA: Diagnosis not present

## 2021-08-29 DIAGNOSIS — Z66 Do not resuscitate: Secondary | ICD-10-CM | POA: Diagnosis not present

## 2021-08-29 DIAGNOSIS — Z7401 Bed confinement status: Secondary | ICD-10-CM | POA: Diagnosis not present

## 2021-08-30 DIAGNOSIS — I10 Essential (primary) hypertension: Secondary | ICD-10-CM | POA: Diagnosis not present

## 2021-08-30 DIAGNOSIS — F0390 Unspecified dementia without behavioral disturbance: Secondary | ICD-10-CM | POA: Diagnosis not present

## 2021-08-30 DIAGNOSIS — I2510 Atherosclerotic heart disease of native coronary artery without angina pectoris: Secondary | ICD-10-CM | POA: Diagnosis not present

## 2021-08-30 DIAGNOSIS — E119 Type 2 diabetes mellitus without complications: Secondary | ICD-10-CM | POA: Diagnosis not present

## 2021-08-30 DIAGNOSIS — Z66 Do not resuscitate: Secondary | ICD-10-CM | POA: Diagnosis not present

## 2021-08-30 DIAGNOSIS — G301 Alzheimer's disease with late onset: Secondary | ICD-10-CM | POA: Diagnosis not present

## 2021-09-01 DIAGNOSIS — G301 Alzheimer's disease with late onset: Secondary | ICD-10-CM | POA: Diagnosis not present

## 2021-09-01 DIAGNOSIS — F0390 Unspecified dementia without behavioral disturbance: Secondary | ICD-10-CM | POA: Diagnosis not present

## 2021-09-01 DIAGNOSIS — I10 Essential (primary) hypertension: Secondary | ICD-10-CM | POA: Diagnosis not present

## 2021-09-01 DIAGNOSIS — Z66 Do not resuscitate: Secondary | ICD-10-CM | POA: Diagnosis not present

## 2021-09-01 DIAGNOSIS — E119 Type 2 diabetes mellitus without complications: Secondary | ICD-10-CM | POA: Diagnosis not present

## 2021-09-01 DIAGNOSIS — I2510 Atherosclerotic heart disease of native coronary artery without angina pectoris: Secondary | ICD-10-CM | POA: Diagnosis not present

## 2021-09-02 DIAGNOSIS — F339 Major depressive disorder, recurrent, unspecified: Secondary | ICD-10-CM | POA: Diagnosis not present

## 2021-09-02 DIAGNOSIS — G309 Alzheimer's disease, unspecified: Secondary | ICD-10-CM | POA: Diagnosis not present

## 2021-09-02 DIAGNOSIS — R41841 Cognitive communication deficit: Secondary | ICD-10-CM | POA: Diagnosis not present

## 2021-09-02 DIAGNOSIS — G9340 Encephalopathy, unspecified: Secondary | ICD-10-CM | POA: Diagnosis not present

## 2021-09-02 DIAGNOSIS — I472 Ventricular tachycardia, unspecified: Secondary | ICD-10-CM | POA: Diagnosis not present

## 2021-09-02 DIAGNOSIS — F419 Anxiety disorder, unspecified: Secondary | ICD-10-CM | POA: Diagnosis not present

## 2021-09-02 DIAGNOSIS — E119 Type 2 diabetes mellitus without complications: Secondary | ICD-10-CM | POA: Diagnosis not present

## 2021-09-02 DIAGNOSIS — E785 Hyperlipidemia, unspecified: Secondary | ICD-10-CM | POA: Diagnosis not present

## 2021-09-02 DIAGNOSIS — G4089 Other seizures: Secondary | ICD-10-CM | POA: Diagnosis not present

## 2021-09-02 DIAGNOSIS — R2681 Unsteadiness on feet: Secondary | ICD-10-CM | POA: Diagnosis not present

## 2021-09-02 DIAGNOSIS — M6281 Muscle weakness (generalized): Secondary | ICD-10-CM | POA: Diagnosis not present

## 2021-09-02 DIAGNOSIS — R262 Difficulty in walking, not elsewhere classified: Secondary | ICD-10-CM | POA: Diagnosis not present

## 2021-09-02 DIAGNOSIS — R1312 Dysphagia, oropharyngeal phase: Secondary | ICD-10-CM | POA: Diagnosis not present

## 2021-09-02 DIAGNOSIS — M6259 Muscle wasting and atrophy, not elsewhere classified, multiple sites: Secondary | ICD-10-CM | POA: Diagnosis not present

## 2021-09-03 DIAGNOSIS — G301 Alzheimer's disease with late onset: Secondary | ICD-10-CM | POA: Diagnosis not present

## 2021-09-03 DIAGNOSIS — F0390 Unspecified dementia without behavioral disturbance: Secondary | ICD-10-CM | POA: Diagnosis not present

## 2021-09-03 DIAGNOSIS — I10 Essential (primary) hypertension: Secondary | ICD-10-CM | POA: Diagnosis not present

## 2021-09-03 DIAGNOSIS — E119 Type 2 diabetes mellitus without complications: Secondary | ICD-10-CM | POA: Diagnosis not present

## 2021-09-03 DIAGNOSIS — I2510 Atherosclerotic heart disease of native coronary artery without angina pectoris: Secondary | ICD-10-CM | POA: Diagnosis not present

## 2021-09-03 DIAGNOSIS — Z66 Do not resuscitate: Secondary | ICD-10-CM | POA: Diagnosis not present

## 2021-09-04 DIAGNOSIS — M6281 Muscle weakness (generalized): Secondary | ICD-10-CM | POA: Diagnosis not present

## 2021-09-04 DIAGNOSIS — R296 Repeated falls: Secondary | ICD-10-CM | POA: Diagnosis not present

## 2021-09-05 DIAGNOSIS — I2510 Atherosclerotic heart disease of native coronary artery without angina pectoris: Secondary | ICD-10-CM | POA: Diagnosis not present

## 2021-09-05 DIAGNOSIS — F0390 Unspecified dementia without behavioral disturbance: Secondary | ICD-10-CM | POA: Diagnosis not present

## 2021-09-05 DIAGNOSIS — G301 Alzheimer's disease with late onset: Secondary | ICD-10-CM | POA: Diagnosis not present

## 2021-09-05 DIAGNOSIS — E119 Type 2 diabetes mellitus without complications: Secondary | ICD-10-CM | POA: Diagnosis not present

## 2021-09-05 DIAGNOSIS — Z66 Do not resuscitate: Secondary | ICD-10-CM | POA: Diagnosis not present

## 2021-09-05 DIAGNOSIS — I10 Essential (primary) hypertension: Secondary | ICD-10-CM | POA: Diagnosis not present

## 2021-09-09 DIAGNOSIS — F5102 Adjustment insomnia: Secondary | ICD-10-CM | POA: Diagnosis not present

## 2021-09-09 DIAGNOSIS — Z66 Do not resuscitate: Secondary | ICD-10-CM | POA: Diagnosis not present

## 2021-09-09 DIAGNOSIS — G301 Alzheimer's disease with late onset: Secondary | ICD-10-CM | POA: Diagnosis not present

## 2021-09-09 DIAGNOSIS — E119 Type 2 diabetes mellitus without complications: Secondary | ICD-10-CM | POA: Diagnosis not present

## 2021-09-09 DIAGNOSIS — F03918 Unspecified dementia, unspecified severity, with other behavioral disturbance: Secondary | ICD-10-CM | POA: Diagnosis not present

## 2021-09-09 DIAGNOSIS — I2510 Atherosclerotic heart disease of native coronary artery without angina pectoris: Secondary | ICD-10-CM | POA: Diagnosis not present

## 2021-09-09 DIAGNOSIS — F0390 Unspecified dementia without behavioral disturbance: Secondary | ICD-10-CM | POA: Diagnosis not present

## 2021-09-09 DIAGNOSIS — I10 Essential (primary) hypertension: Secondary | ICD-10-CM | POA: Diagnosis not present

## 2021-09-09 DIAGNOSIS — F419 Anxiety disorder, unspecified: Secondary | ICD-10-CM | POA: Diagnosis not present

## 2021-09-10 DIAGNOSIS — S01112A Laceration without foreign body of left eyelid and periocular area, initial encounter: Secondary | ICD-10-CM | POA: Diagnosis not present

## 2021-09-10 DIAGNOSIS — M6281 Muscle weakness (generalized): Secondary | ICD-10-CM | POA: Diagnosis not present

## 2021-09-10 DIAGNOSIS — R269 Unspecified abnormalities of gait and mobility: Secondary | ICD-10-CM | POA: Diagnosis not present

## 2021-09-10 DIAGNOSIS — S41112A Laceration without foreign body of left upper arm, initial encounter: Secondary | ICD-10-CM | POA: Diagnosis not present

## 2021-09-10 DIAGNOSIS — G309 Alzheimer's disease, unspecified: Secondary | ICD-10-CM | POA: Diagnosis not present

## 2021-09-10 DIAGNOSIS — R2689 Other abnormalities of gait and mobility: Secondary | ICD-10-CM | POA: Diagnosis not present

## 2021-09-10 DIAGNOSIS — W19XXXA Unspecified fall, initial encounter: Secondary | ICD-10-CM | POA: Diagnosis not present

## 2021-09-11 DIAGNOSIS — R262 Difficulty in walking, not elsewhere classified: Secondary | ICD-10-CM | POA: Diagnosis not present

## 2021-09-11 DIAGNOSIS — F0390 Unspecified dementia without behavioral disturbance: Secondary | ICD-10-CM | POA: Diagnosis not present

## 2021-09-11 DIAGNOSIS — E119 Type 2 diabetes mellitus without complications: Secondary | ICD-10-CM | POA: Diagnosis not present

## 2021-09-11 DIAGNOSIS — I472 Ventricular tachycardia, unspecified: Secondary | ICD-10-CM | POA: Diagnosis not present

## 2021-09-11 DIAGNOSIS — I2510 Atherosclerotic heart disease of native coronary artery without angina pectoris: Secondary | ICD-10-CM | POA: Diagnosis not present

## 2021-09-11 DIAGNOSIS — R2681 Unsteadiness on feet: Secondary | ICD-10-CM | POA: Diagnosis not present

## 2021-09-11 DIAGNOSIS — M6259 Muscle wasting and atrophy, not elsewhere classified, multiple sites: Secondary | ICD-10-CM | POA: Diagnosis not present

## 2021-09-11 DIAGNOSIS — I10 Essential (primary) hypertension: Secondary | ICD-10-CM | POA: Diagnosis not present

## 2021-09-11 DIAGNOSIS — G301 Alzheimer's disease with late onset: Secondary | ICD-10-CM | POA: Diagnosis not present

## 2021-09-11 DIAGNOSIS — Z66 Do not resuscitate: Secondary | ICD-10-CM | POA: Diagnosis not present

## 2021-09-15 DIAGNOSIS — R262 Difficulty in walking, not elsewhere classified: Secondary | ICD-10-CM | POA: Diagnosis not present

## 2021-09-15 DIAGNOSIS — I2510 Atherosclerotic heart disease of native coronary artery without angina pectoris: Secondary | ICD-10-CM | POA: Diagnosis not present

## 2021-09-15 DIAGNOSIS — I10 Essential (primary) hypertension: Secondary | ICD-10-CM | POA: Diagnosis not present

## 2021-09-15 DIAGNOSIS — Z66 Do not resuscitate: Secondary | ICD-10-CM | POA: Diagnosis not present

## 2021-09-15 DIAGNOSIS — M6259 Muscle wasting and atrophy, not elsewhere classified, multiple sites: Secondary | ICD-10-CM | POA: Diagnosis not present

## 2021-09-15 DIAGNOSIS — I472 Ventricular tachycardia, unspecified: Secondary | ICD-10-CM | POA: Diagnosis not present

## 2021-09-15 DIAGNOSIS — F0390 Unspecified dementia without behavioral disturbance: Secondary | ICD-10-CM | POA: Diagnosis not present

## 2021-09-15 DIAGNOSIS — G301 Alzheimer's disease with late onset: Secondary | ICD-10-CM | POA: Diagnosis not present

## 2021-09-15 DIAGNOSIS — E119 Type 2 diabetes mellitus without complications: Secondary | ICD-10-CM | POA: Diagnosis not present

## 2021-09-15 DIAGNOSIS — R2681 Unsteadiness on feet: Secondary | ICD-10-CM | POA: Diagnosis not present

## 2021-09-16 DIAGNOSIS — Z66 Do not resuscitate: Secondary | ICD-10-CM | POA: Diagnosis not present

## 2021-09-16 DIAGNOSIS — E119 Type 2 diabetes mellitus without complications: Secondary | ICD-10-CM | POA: Diagnosis not present

## 2021-09-16 DIAGNOSIS — I2510 Atherosclerotic heart disease of native coronary artery without angina pectoris: Secondary | ICD-10-CM | POA: Diagnosis not present

## 2021-09-16 DIAGNOSIS — F0390 Unspecified dementia without behavioral disturbance: Secondary | ICD-10-CM | POA: Diagnosis not present

## 2021-09-16 DIAGNOSIS — G301 Alzheimer's disease with late onset: Secondary | ICD-10-CM | POA: Diagnosis not present

## 2021-09-16 DIAGNOSIS — I10 Essential (primary) hypertension: Secondary | ICD-10-CM | POA: Diagnosis not present

## 2021-09-17 DIAGNOSIS — R262 Difficulty in walking, not elsewhere classified: Secondary | ICD-10-CM | POA: Diagnosis not present

## 2021-09-17 DIAGNOSIS — I472 Ventricular tachycardia, unspecified: Secondary | ICD-10-CM | POA: Diagnosis not present

## 2021-09-17 DIAGNOSIS — R2681 Unsteadiness on feet: Secondary | ICD-10-CM | POA: Diagnosis not present

## 2021-09-17 DIAGNOSIS — M6259 Muscle wasting and atrophy, not elsewhere classified, multiple sites: Secondary | ICD-10-CM | POA: Diagnosis not present

## 2021-09-18 DIAGNOSIS — I2510 Atherosclerotic heart disease of native coronary artery without angina pectoris: Secondary | ICD-10-CM | POA: Diagnosis not present

## 2021-09-18 DIAGNOSIS — Z66 Do not resuscitate: Secondary | ICD-10-CM | POA: Diagnosis not present

## 2021-09-18 DIAGNOSIS — F0390 Unspecified dementia without behavioral disturbance: Secondary | ICD-10-CM | POA: Diagnosis not present

## 2021-09-18 DIAGNOSIS — E119 Type 2 diabetes mellitus without complications: Secondary | ICD-10-CM | POA: Diagnosis not present

## 2021-09-18 DIAGNOSIS — I10 Essential (primary) hypertension: Secondary | ICD-10-CM | POA: Diagnosis not present

## 2021-09-18 DIAGNOSIS — G301 Alzheimer's disease with late onset: Secondary | ICD-10-CM | POA: Diagnosis not present

## 2021-09-19 DIAGNOSIS — R2681 Unsteadiness on feet: Secondary | ICD-10-CM | POA: Diagnosis not present

## 2021-09-19 DIAGNOSIS — M6281 Muscle weakness (generalized): Secondary | ICD-10-CM | POA: Diagnosis not present

## 2021-09-19 DIAGNOSIS — Z9181 History of falling: Secondary | ICD-10-CM | POA: Diagnosis not present

## 2021-09-19 DIAGNOSIS — G309 Alzheimer's disease, unspecified: Secondary | ICD-10-CM | POA: Diagnosis not present

## 2021-09-19 DIAGNOSIS — W19XXXA Unspecified fall, initial encounter: Secondary | ICD-10-CM | POA: Diagnosis not present

## 2021-09-19 DIAGNOSIS — R2689 Other abnormalities of gait and mobility: Secondary | ICD-10-CM | POA: Diagnosis not present

## 2021-09-19 DIAGNOSIS — R262 Difficulty in walking, not elsewhere classified: Secondary | ICD-10-CM | POA: Diagnosis not present

## 2021-09-19 DIAGNOSIS — I472 Ventricular tachycardia, unspecified: Secondary | ICD-10-CM | POA: Diagnosis not present

## 2021-09-19 DIAGNOSIS — M6259 Muscle wasting and atrophy, not elsewhere classified, multiple sites: Secondary | ICD-10-CM | POA: Diagnosis not present

## 2021-09-19 DIAGNOSIS — R269 Unspecified abnormalities of gait and mobility: Secondary | ICD-10-CM | POA: Diagnosis not present

## 2021-09-19 DIAGNOSIS — S51802A Unspecified open wound of left forearm, initial encounter: Secondary | ICD-10-CM | POA: Diagnosis not present

## 2021-09-22 DIAGNOSIS — R262 Difficulty in walking, not elsewhere classified: Secondary | ICD-10-CM | POA: Diagnosis not present

## 2021-09-22 DIAGNOSIS — I472 Ventricular tachycardia, unspecified: Secondary | ICD-10-CM | POA: Diagnosis not present

## 2021-09-22 DIAGNOSIS — R2681 Unsteadiness on feet: Secondary | ICD-10-CM | POA: Diagnosis not present

## 2021-09-22 DIAGNOSIS — M6259 Muscle wasting and atrophy, not elsewhere classified, multiple sites: Secondary | ICD-10-CM | POA: Diagnosis not present

## 2021-09-23 DIAGNOSIS — I2510 Atherosclerotic heart disease of native coronary artery without angina pectoris: Secondary | ICD-10-CM | POA: Diagnosis not present

## 2021-09-23 DIAGNOSIS — G301 Alzheimer's disease with late onset: Secondary | ICD-10-CM | POA: Diagnosis not present

## 2021-09-23 DIAGNOSIS — F0390 Unspecified dementia without behavioral disturbance: Secondary | ICD-10-CM | POA: Diagnosis not present

## 2021-09-23 DIAGNOSIS — I10 Essential (primary) hypertension: Secondary | ICD-10-CM | POA: Diagnosis not present

## 2021-09-23 DIAGNOSIS — E119 Type 2 diabetes mellitus without complications: Secondary | ICD-10-CM | POA: Diagnosis not present

## 2021-09-23 DIAGNOSIS — Z66 Do not resuscitate: Secondary | ICD-10-CM | POA: Diagnosis not present

## 2021-09-24 DIAGNOSIS — E119 Type 2 diabetes mellitus without complications: Secondary | ICD-10-CM | POA: Diagnosis not present

## 2021-09-24 DIAGNOSIS — G301 Alzheimer's disease with late onset: Secondary | ICD-10-CM | POA: Diagnosis not present

## 2021-09-24 DIAGNOSIS — Z66 Do not resuscitate: Secondary | ICD-10-CM | POA: Diagnosis not present

## 2021-09-24 DIAGNOSIS — I2510 Atherosclerotic heart disease of native coronary artery without angina pectoris: Secondary | ICD-10-CM | POA: Diagnosis not present

## 2021-09-24 DIAGNOSIS — F0390 Unspecified dementia without behavioral disturbance: Secondary | ICD-10-CM | POA: Diagnosis not present

## 2021-09-24 DIAGNOSIS — I10 Essential (primary) hypertension: Secondary | ICD-10-CM | POA: Diagnosis not present

## 2021-09-25 DIAGNOSIS — I2510 Atherosclerotic heart disease of native coronary artery without angina pectoris: Secondary | ICD-10-CM | POA: Diagnosis not present

## 2021-09-25 DIAGNOSIS — G301 Alzheimer's disease with late onset: Secondary | ICD-10-CM | POA: Diagnosis not present

## 2021-09-25 DIAGNOSIS — Z66 Do not resuscitate: Secondary | ICD-10-CM | POA: Diagnosis not present

## 2021-09-25 DIAGNOSIS — F0390 Unspecified dementia without behavioral disturbance: Secondary | ICD-10-CM | POA: Diagnosis not present

## 2021-09-25 DIAGNOSIS — I10 Essential (primary) hypertension: Secondary | ICD-10-CM | POA: Diagnosis not present

## 2021-09-25 DIAGNOSIS — E119 Type 2 diabetes mellitus without complications: Secondary | ICD-10-CM | POA: Diagnosis not present

## 2021-09-26 DIAGNOSIS — I10 Essential (primary) hypertension: Secondary | ICD-10-CM | POA: Diagnosis not present

## 2021-09-26 DIAGNOSIS — I2510 Atherosclerotic heart disease of native coronary artery without angina pectoris: Secondary | ICD-10-CM | POA: Diagnosis not present

## 2021-09-26 DIAGNOSIS — Z66 Do not resuscitate: Secondary | ICD-10-CM | POA: Diagnosis not present

## 2021-09-26 DIAGNOSIS — G301 Alzheimer's disease with late onset: Secondary | ICD-10-CM | POA: Diagnosis not present

## 2021-09-26 DIAGNOSIS — E119 Type 2 diabetes mellitus without complications: Secondary | ICD-10-CM | POA: Diagnosis not present

## 2021-09-26 DIAGNOSIS — F0390 Unspecified dementia without behavioral disturbance: Secondary | ICD-10-CM | POA: Diagnosis not present

## 2021-10-03 DIAGNOSIS — M6281 Muscle weakness (generalized): Secondary | ICD-10-CM | POA: Diagnosis not present

## 2021-10-03 DIAGNOSIS — L89320 Pressure ulcer of left buttock, unstageable: Secondary | ICD-10-CM | POA: Diagnosis not present

## 2021-10-03 DIAGNOSIS — K921 Melena: Secondary | ICD-10-CM | POA: Diagnosis not present

## 2021-10-03 DIAGNOSIS — G309 Alzheimer's disease, unspecified: Secondary | ICD-10-CM | POA: Diagnosis not present

## 2021-10-03 DIAGNOSIS — R269 Unspecified abnormalities of gait and mobility: Secondary | ICD-10-CM | POA: Diagnosis not present

## 2021-10-07 DIAGNOSIS — R1312 Dysphagia, oropharyngeal phase: Secondary | ICD-10-CM | POA: Diagnosis not present

## 2021-10-07 DIAGNOSIS — R41841 Cognitive communication deficit: Secondary | ICD-10-CM | POA: Diagnosis not present

## 2021-10-07 DIAGNOSIS — G9340 Encephalopathy, unspecified: Secondary | ICD-10-CM | POA: Diagnosis not present

## 2021-10-07 DIAGNOSIS — E785 Hyperlipidemia, unspecified: Secondary | ICD-10-CM | POA: Diagnosis not present

## 2021-10-07 DIAGNOSIS — G309 Alzheimer's disease, unspecified: Secondary | ICD-10-CM | POA: Diagnosis not present

## 2021-10-07 DIAGNOSIS — G4089 Other seizures: Secondary | ICD-10-CM | POA: Diagnosis not present

## 2021-10-07 DIAGNOSIS — E119 Type 2 diabetes mellitus without complications: Secondary | ICD-10-CM | POA: Diagnosis not present

## 2021-10-07 DIAGNOSIS — M6281 Muscle weakness (generalized): Secondary | ICD-10-CM | POA: Diagnosis not present

## 2021-10-07 DIAGNOSIS — F419 Anxiety disorder, unspecified: Secondary | ICD-10-CM | POA: Diagnosis not present

## 2021-10-07 DIAGNOSIS — F339 Major depressive disorder, recurrent, unspecified: Secondary | ICD-10-CM | POA: Diagnosis not present

## 2021-10-09 DIAGNOSIS — E119 Type 2 diabetes mellitus without complications: Secondary | ICD-10-CM | POA: Diagnosis not present

## 2021-10-09 DIAGNOSIS — R41841 Cognitive communication deficit: Secondary | ICD-10-CM | POA: Diagnosis not present

## 2021-10-09 DIAGNOSIS — G9340 Encephalopathy, unspecified: Secondary | ICD-10-CM | POA: Diagnosis not present

## 2021-10-09 DIAGNOSIS — E785 Hyperlipidemia, unspecified: Secondary | ICD-10-CM | POA: Diagnosis not present

## 2021-10-09 DIAGNOSIS — M6281 Muscle weakness (generalized): Secondary | ICD-10-CM | POA: Diagnosis not present

## 2021-10-09 DIAGNOSIS — F339 Major depressive disorder, recurrent, unspecified: Secondary | ICD-10-CM | POA: Diagnosis not present

## 2021-10-09 DIAGNOSIS — F419 Anxiety disorder, unspecified: Secondary | ICD-10-CM | POA: Diagnosis not present

## 2021-10-09 DIAGNOSIS — R1312 Dysphagia, oropharyngeal phase: Secondary | ICD-10-CM | POA: Diagnosis not present

## 2021-10-09 DIAGNOSIS — G309 Alzheimer's disease, unspecified: Secondary | ICD-10-CM | POA: Diagnosis not present

## 2021-10-09 DIAGNOSIS — G4089 Other seizures: Secondary | ICD-10-CM | POA: Diagnosis not present

## 2021-10-13 DIAGNOSIS — I472 Ventricular tachycardia, unspecified: Secondary | ICD-10-CM | POA: Diagnosis not present

## 2021-10-13 DIAGNOSIS — M6259 Muscle wasting and atrophy, not elsewhere classified, multiple sites: Secondary | ICD-10-CM | POA: Diagnosis not present

## 2021-10-13 DIAGNOSIS — R262 Difficulty in walking, not elsewhere classified: Secondary | ICD-10-CM | POA: Diagnosis not present

## 2021-10-13 DIAGNOSIS — R2681 Unsteadiness on feet: Secondary | ICD-10-CM | POA: Diagnosis not present

## 2021-10-14 DIAGNOSIS — F03918 Unspecified dementia, unspecified severity, with other behavioral disturbance: Secondary | ICD-10-CM | POA: Diagnosis not present

## 2021-10-14 DIAGNOSIS — M6281 Muscle weakness (generalized): Secondary | ICD-10-CM | POA: Diagnosis not present

## 2021-10-14 DIAGNOSIS — G9340 Encephalopathy, unspecified: Secondary | ICD-10-CM | POA: Diagnosis not present

## 2021-10-14 DIAGNOSIS — F419 Anxiety disorder, unspecified: Secondary | ICD-10-CM | POA: Diagnosis not present

## 2021-10-14 DIAGNOSIS — R41841 Cognitive communication deficit: Secondary | ICD-10-CM | POA: Diagnosis not present

## 2021-10-14 DIAGNOSIS — E119 Type 2 diabetes mellitus without complications: Secondary | ICD-10-CM | POA: Diagnosis not present

## 2021-10-14 DIAGNOSIS — F5102 Adjustment insomnia: Secondary | ICD-10-CM | POA: Diagnosis not present

## 2021-10-14 DIAGNOSIS — G309 Alzheimer's disease, unspecified: Secondary | ICD-10-CM | POA: Diagnosis not present

## 2021-10-14 DIAGNOSIS — F339 Major depressive disorder, recurrent, unspecified: Secondary | ICD-10-CM | POA: Diagnosis not present

## 2021-10-14 DIAGNOSIS — G4089 Other seizures: Secondary | ICD-10-CM | POA: Diagnosis not present

## 2021-10-14 DIAGNOSIS — E785 Hyperlipidemia, unspecified: Secondary | ICD-10-CM | POA: Diagnosis not present

## 2021-10-14 DIAGNOSIS — R1312 Dysphagia, oropharyngeal phase: Secondary | ICD-10-CM | POA: Diagnosis not present

## 2021-10-15 DIAGNOSIS — I472 Ventricular tachycardia, unspecified: Secondary | ICD-10-CM | POA: Diagnosis not present

## 2021-10-15 DIAGNOSIS — R262 Difficulty in walking, not elsewhere classified: Secondary | ICD-10-CM | POA: Diagnosis not present

## 2021-10-15 DIAGNOSIS — R2681 Unsteadiness on feet: Secondary | ICD-10-CM | POA: Diagnosis not present

## 2021-10-15 DIAGNOSIS — M6259 Muscle wasting and atrophy, not elsewhere classified, multiple sites: Secondary | ICD-10-CM | POA: Diagnosis not present

## 2021-10-17 DIAGNOSIS — E785 Hyperlipidemia, unspecified: Secondary | ICD-10-CM | POA: Diagnosis not present

## 2021-10-17 DIAGNOSIS — M6281 Muscle weakness (generalized): Secondary | ICD-10-CM | POA: Diagnosis not present

## 2021-10-17 DIAGNOSIS — G309 Alzheimer's disease, unspecified: Secondary | ICD-10-CM | POA: Diagnosis not present

## 2021-10-17 DIAGNOSIS — G4089 Other seizures: Secondary | ICD-10-CM | POA: Diagnosis not present

## 2021-10-17 DIAGNOSIS — R41841 Cognitive communication deficit: Secondary | ICD-10-CM | POA: Diagnosis not present

## 2021-10-17 DIAGNOSIS — G9340 Encephalopathy, unspecified: Secondary | ICD-10-CM | POA: Diagnosis not present

## 2021-10-17 DIAGNOSIS — F419 Anxiety disorder, unspecified: Secondary | ICD-10-CM | POA: Diagnosis not present

## 2021-10-17 DIAGNOSIS — R1312 Dysphagia, oropharyngeal phase: Secondary | ICD-10-CM | POA: Diagnosis not present

## 2021-10-17 DIAGNOSIS — E119 Type 2 diabetes mellitus without complications: Secondary | ICD-10-CM | POA: Diagnosis not present

## 2021-10-17 DIAGNOSIS — F339 Major depressive disorder, recurrent, unspecified: Secondary | ICD-10-CM | POA: Diagnosis not present

## 2021-10-18 DIAGNOSIS — R262 Difficulty in walking, not elsewhere classified: Secondary | ICD-10-CM | POA: Diagnosis not present

## 2021-10-18 DIAGNOSIS — R2681 Unsteadiness on feet: Secondary | ICD-10-CM | POA: Diagnosis not present

## 2021-10-18 DIAGNOSIS — I472 Ventricular tachycardia, unspecified: Secondary | ICD-10-CM | POA: Diagnosis not present

## 2021-10-18 DIAGNOSIS — M6259 Muscle wasting and atrophy, not elsewhere classified, multiple sites: Secondary | ICD-10-CM | POA: Diagnosis not present

## 2021-10-21 DIAGNOSIS — I472 Ventricular tachycardia, unspecified: Secondary | ICD-10-CM | POA: Diagnosis not present

## 2021-10-21 DIAGNOSIS — M6259 Muscle wasting and atrophy, not elsewhere classified, multiple sites: Secondary | ICD-10-CM | POA: Diagnosis not present

## 2021-10-21 DIAGNOSIS — R262 Difficulty in walking, not elsewhere classified: Secondary | ICD-10-CM | POA: Diagnosis not present

## 2021-10-21 DIAGNOSIS — R2681 Unsteadiness on feet: Secondary | ICD-10-CM | POA: Diagnosis not present

## 2021-10-22 DIAGNOSIS — R41841 Cognitive communication deficit: Secondary | ICD-10-CM | POA: Diagnosis not present

## 2021-10-22 DIAGNOSIS — G309 Alzheimer's disease, unspecified: Secondary | ICD-10-CM | POA: Diagnosis not present

## 2021-10-22 DIAGNOSIS — R1312 Dysphagia, oropharyngeal phase: Secondary | ICD-10-CM | POA: Diagnosis not present

## 2021-10-22 DIAGNOSIS — E119 Type 2 diabetes mellitus without complications: Secondary | ICD-10-CM | POA: Diagnosis not present

## 2021-10-22 DIAGNOSIS — G9340 Encephalopathy, unspecified: Secondary | ICD-10-CM | POA: Diagnosis not present

## 2021-10-22 DIAGNOSIS — F419 Anxiety disorder, unspecified: Secondary | ICD-10-CM | POA: Diagnosis not present

## 2021-10-22 DIAGNOSIS — E785 Hyperlipidemia, unspecified: Secondary | ICD-10-CM | POA: Diagnosis not present

## 2021-10-22 DIAGNOSIS — F339 Major depressive disorder, recurrent, unspecified: Secondary | ICD-10-CM | POA: Diagnosis not present

## 2021-10-22 DIAGNOSIS — G4089 Other seizures: Secondary | ICD-10-CM | POA: Diagnosis not present

## 2021-10-22 DIAGNOSIS — M6281 Muscle weakness (generalized): Secondary | ICD-10-CM | POA: Diagnosis not present

## 2021-10-23 DIAGNOSIS — G309 Alzheimer's disease, unspecified: Secondary | ICD-10-CM | POA: Diagnosis not present

## 2021-10-23 DIAGNOSIS — M6281 Muscle weakness (generalized): Secondary | ICD-10-CM | POA: Diagnosis not present

## 2021-10-23 DIAGNOSIS — F339 Major depressive disorder, recurrent, unspecified: Secondary | ICD-10-CM | POA: Diagnosis not present

## 2021-10-23 DIAGNOSIS — R41841 Cognitive communication deficit: Secondary | ICD-10-CM | POA: Diagnosis not present

## 2021-10-23 DIAGNOSIS — F419 Anxiety disorder, unspecified: Secondary | ICD-10-CM | POA: Diagnosis not present

## 2021-10-23 DIAGNOSIS — G9340 Encephalopathy, unspecified: Secondary | ICD-10-CM | POA: Diagnosis not present

## 2021-10-23 DIAGNOSIS — E119 Type 2 diabetes mellitus without complications: Secondary | ICD-10-CM | POA: Diagnosis not present

## 2021-10-23 DIAGNOSIS — E785 Hyperlipidemia, unspecified: Secondary | ICD-10-CM | POA: Diagnosis not present

## 2021-10-23 DIAGNOSIS — G4089 Other seizures: Secondary | ICD-10-CM | POA: Diagnosis not present

## 2021-10-23 DIAGNOSIS — R1312 Dysphagia, oropharyngeal phase: Secondary | ICD-10-CM | POA: Diagnosis not present

## 2021-10-24 DIAGNOSIS — F419 Anxiety disorder, unspecified: Secondary | ICD-10-CM | POA: Diagnosis not present

## 2021-10-24 DIAGNOSIS — E785 Hyperlipidemia, unspecified: Secondary | ICD-10-CM | POA: Diagnosis not present

## 2021-10-24 DIAGNOSIS — F339 Major depressive disorder, recurrent, unspecified: Secondary | ICD-10-CM | POA: Diagnosis not present

## 2021-10-24 DIAGNOSIS — M6281 Muscle weakness (generalized): Secondary | ICD-10-CM | POA: Diagnosis not present

## 2021-10-24 DIAGNOSIS — G4089 Other seizures: Secondary | ICD-10-CM | POA: Diagnosis not present

## 2021-10-24 DIAGNOSIS — R41841 Cognitive communication deficit: Secondary | ICD-10-CM | POA: Diagnosis not present

## 2021-10-24 DIAGNOSIS — E119 Type 2 diabetes mellitus without complications: Secondary | ICD-10-CM | POA: Diagnosis not present

## 2021-10-24 DIAGNOSIS — G9340 Encephalopathy, unspecified: Secondary | ICD-10-CM | POA: Diagnosis not present

## 2021-10-24 DIAGNOSIS — R1312 Dysphagia, oropharyngeal phase: Secondary | ICD-10-CM | POA: Diagnosis not present

## 2021-10-24 DIAGNOSIS — G309 Alzheimer's disease, unspecified: Secondary | ICD-10-CM | POA: Diagnosis not present

## 2021-10-26 DIAGNOSIS — F0390 Unspecified dementia without behavioral disturbance: Secondary | ICD-10-CM | POA: Diagnosis not present

## 2021-10-26 DIAGNOSIS — G301 Alzheimer's disease with late onset: Secondary | ICD-10-CM | POA: Diagnosis not present

## 2021-10-26 DIAGNOSIS — I2510 Atherosclerotic heart disease of native coronary artery without angina pectoris: Secondary | ICD-10-CM | POA: Diagnosis not present

## 2021-10-26 DIAGNOSIS — E119 Type 2 diabetes mellitus without complications: Secondary | ICD-10-CM | POA: Diagnosis not present

## 2021-10-26 DIAGNOSIS — Z66 Do not resuscitate: Secondary | ICD-10-CM | POA: Diagnosis not present

## 2021-10-26 DIAGNOSIS — I10 Essential (primary) hypertension: Secondary | ICD-10-CM | POA: Diagnosis not present

## 2021-10-27 DIAGNOSIS — G309 Alzheimer's disease, unspecified: Secondary | ICD-10-CM | POA: Diagnosis not present

## 2021-10-27 DIAGNOSIS — L989 Disorder of the skin and subcutaneous tissue, unspecified: Secondary | ICD-10-CM | POA: Diagnosis not present

## 2021-10-30 DIAGNOSIS — Z23 Encounter for immunization: Secondary | ICD-10-CM | POA: Diagnosis not present

## 2021-11-11 DIAGNOSIS — F5102 Adjustment insomnia: Secondary | ICD-10-CM | POA: Diagnosis not present

## 2021-11-11 DIAGNOSIS — F03918 Unspecified dementia, unspecified severity, with other behavioral disturbance: Secondary | ICD-10-CM | POA: Diagnosis not present

## 2021-11-11 DIAGNOSIS — F419 Anxiety disorder, unspecified: Secondary | ICD-10-CM | POA: Diagnosis not present

## 2021-11-13 DIAGNOSIS — I119 Hypertensive heart disease without heart failure: Secondary | ICD-10-CM | POA: Diagnosis not present

## 2021-11-13 DIAGNOSIS — G309 Alzheimer's disease, unspecified: Secondary | ICD-10-CM | POA: Diagnosis not present

## 2021-11-26 DIAGNOSIS — I10 Essential (primary) hypertension: Secondary | ICD-10-CM | POA: Diagnosis not present

## 2021-11-26 DIAGNOSIS — Z66 Do not resuscitate: Secondary | ICD-10-CM | POA: Diagnosis not present

## 2021-11-26 DIAGNOSIS — E119 Type 2 diabetes mellitus without complications: Secondary | ICD-10-CM | POA: Diagnosis not present

## 2021-11-26 DIAGNOSIS — G301 Alzheimer's disease with late onset: Secondary | ICD-10-CM | POA: Diagnosis not present

## 2021-11-26 DIAGNOSIS — F0390 Unspecified dementia without behavioral disturbance: Secondary | ICD-10-CM | POA: Diagnosis not present

## 2021-11-26 DIAGNOSIS — I2510 Atherosclerotic heart disease of native coronary artery without angina pectoris: Secondary | ICD-10-CM | POA: Diagnosis not present

## 2021-11-28 DIAGNOSIS — G309 Alzheimer's disease, unspecified: Secondary | ICD-10-CM | POA: Diagnosis not present

## 2021-11-28 DIAGNOSIS — R600 Localized edema: Secondary | ICD-10-CM | POA: Diagnosis not present

## 2021-11-28 DIAGNOSIS — I119 Hypertensive heart disease without heart failure: Secondary | ICD-10-CM | POA: Diagnosis not present

## 2021-11-28 DIAGNOSIS — Z5181 Encounter for therapeutic drug level monitoring: Secondary | ICD-10-CM | POA: Diagnosis not present

## 2021-12-12 DIAGNOSIS — L609 Nail disorder, unspecified: Secondary | ICD-10-CM | POA: Diagnosis not present

## 2021-12-16 DIAGNOSIS — F03918 Unspecified dementia, unspecified severity, with other behavioral disturbance: Secondary | ICD-10-CM | POA: Diagnosis not present

## 2021-12-16 DIAGNOSIS — F5102 Adjustment insomnia: Secondary | ICD-10-CM | POA: Diagnosis not present

## 2021-12-16 DIAGNOSIS — F419 Anxiety disorder, unspecified: Secondary | ICD-10-CM | POA: Diagnosis not present

## 2021-12-26 DIAGNOSIS — Z66 Do not resuscitate: Secondary | ICD-10-CM | POA: Diagnosis not present

## 2021-12-26 DIAGNOSIS — G301 Alzheimer's disease with late onset: Secondary | ICD-10-CM | POA: Diagnosis not present

## 2021-12-26 DIAGNOSIS — E119 Type 2 diabetes mellitus without complications: Secondary | ICD-10-CM | POA: Diagnosis not present

## 2021-12-26 DIAGNOSIS — F0390 Unspecified dementia without behavioral disturbance: Secondary | ICD-10-CM | POA: Diagnosis not present

## 2021-12-26 DIAGNOSIS — I2510 Atherosclerotic heart disease of native coronary artery without angina pectoris: Secondary | ICD-10-CM | POA: Diagnosis not present

## 2021-12-26 DIAGNOSIS — I10 Essential (primary) hypertension: Secondary | ICD-10-CM | POA: Diagnosis not present

## 2021-12-29 DIAGNOSIS — G301 Alzheimer's disease with late onset: Secondary | ICD-10-CM | POA: Diagnosis not present

## 2021-12-29 DIAGNOSIS — Z66 Do not resuscitate: Secondary | ICD-10-CM | POA: Diagnosis not present

## 2021-12-29 DIAGNOSIS — I10 Essential (primary) hypertension: Secondary | ICD-10-CM | POA: Diagnosis not present

## 2021-12-29 DIAGNOSIS — F0390 Unspecified dementia without behavioral disturbance: Secondary | ICD-10-CM | POA: Diagnosis not present

## 2021-12-29 DIAGNOSIS — I2510 Atherosclerotic heart disease of native coronary artery without angina pectoris: Secondary | ICD-10-CM | POA: Diagnosis not present

## 2021-12-29 DIAGNOSIS — E119 Type 2 diabetes mellitus without complications: Secondary | ICD-10-CM | POA: Diagnosis not present

## 2021-12-31 DIAGNOSIS — I2510 Atherosclerotic heart disease of native coronary artery without angina pectoris: Secondary | ICD-10-CM | POA: Diagnosis not present

## 2021-12-31 DIAGNOSIS — F0390 Unspecified dementia without behavioral disturbance: Secondary | ICD-10-CM | POA: Diagnosis not present

## 2021-12-31 DIAGNOSIS — E119 Type 2 diabetes mellitus without complications: Secondary | ICD-10-CM | POA: Diagnosis not present

## 2021-12-31 DIAGNOSIS — I10 Essential (primary) hypertension: Secondary | ICD-10-CM | POA: Diagnosis not present

## 2021-12-31 DIAGNOSIS — G301 Alzheimer's disease with late onset: Secondary | ICD-10-CM | POA: Diagnosis not present

## 2021-12-31 DIAGNOSIS — Z66 Do not resuscitate: Secondary | ICD-10-CM | POA: Diagnosis not present

## 2022-01-01 DIAGNOSIS — F0390 Unspecified dementia without behavioral disturbance: Secondary | ICD-10-CM | POA: Diagnosis not present

## 2022-01-01 DIAGNOSIS — G301 Alzheimer's disease with late onset: Secondary | ICD-10-CM | POA: Diagnosis not present

## 2022-01-01 DIAGNOSIS — Z66 Do not resuscitate: Secondary | ICD-10-CM | POA: Diagnosis not present

## 2022-01-01 DIAGNOSIS — I10 Essential (primary) hypertension: Secondary | ICD-10-CM | POA: Diagnosis not present

## 2022-01-01 DIAGNOSIS — I2510 Atherosclerotic heart disease of native coronary artery without angina pectoris: Secondary | ICD-10-CM | POA: Diagnosis not present

## 2022-01-01 DIAGNOSIS — E119 Type 2 diabetes mellitus without complications: Secondary | ICD-10-CM | POA: Diagnosis not present

## 2022-01-08 DIAGNOSIS — F0390 Unspecified dementia without behavioral disturbance: Secondary | ICD-10-CM | POA: Diagnosis not present

## 2022-01-08 DIAGNOSIS — I10 Essential (primary) hypertension: Secondary | ICD-10-CM | POA: Diagnosis not present

## 2022-01-08 DIAGNOSIS — G301 Alzheimer's disease with late onset: Secondary | ICD-10-CM | POA: Diagnosis not present

## 2022-01-08 DIAGNOSIS — Z66 Do not resuscitate: Secondary | ICD-10-CM | POA: Diagnosis not present

## 2022-01-08 DIAGNOSIS — I2510 Atherosclerotic heart disease of native coronary artery without angina pectoris: Secondary | ICD-10-CM | POA: Diagnosis not present

## 2022-01-08 DIAGNOSIS — E119 Type 2 diabetes mellitus without complications: Secondary | ICD-10-CM | POA: Diagnosis not present

## 2022-01-09 DIAGNOSIS — I10 Essential (primary) hypertension: Secondary | ICD-10-CM | POA: Diagnosis not present

## 2022-01-09 DIAGNOSIS — F0390 Unspecified dementia without behavioral disturbance: Secondary | ICD-10-CM | POA: Diagnosis not present

## 2022-01-09 DIAGNOSIS — F419 Anxiety disorder, unspecified: Secondary | ICD-10-CM | POA: Diagnosis not present

## 2022-01-09 DIAGNOSIS — F03918 Unspecified dementia, unspecified severity, with other behavioral disturbance: Secondary | ICD-10-CM | POA: Diagnosis not present

## 2022-01-09 DIAGNOSIS — I2510 Atherosclerotic heart disease of native coronary artery without angina pectoris: Secondary | ICD-10-CM | POA: Diagnosis not present

## 2022-01-09 DIAGNOSIS — E119 Type 2 diabetes mellitus without complications: Secondary | ICD-10-CM | POA: Diagnosis not present

## 2022-01-09 DIAGNOSIS — Z66 Do not resuscitate: Secondary | ICD-10-CM | POA: Diagnosis not present

## 2022-01-09 DIAGNOSIS — G301 Alzheimer's disease with late onset: Secondary | ICD-10-CM | POA: Diagnosis not present

## 2022-01-09 DIAGNOSIS — F5102 Adjustment insomnia: Secondary | ICD-10-CM | POA: Diagnosis not present

## 2022-01-12 DIAGNOSIS — Z66 Do not resuscitate: Secondary | ICD-10-CM | POA: Diagnosis not present

## 2022-01-12 DIAGNOSIS — F0390 Unspecified dementia without behavioral disturbance: Secondary | ICD-10-CM | POA: Diagnosis not present

## 2022-01-12 DIAGNOSIS — I10 Essential (primary) hypertension: Secondary | ICD-10-CM | POA: Diagnosis not present

## 2022-01-12 DIAGNOSIS — I2510 Atherosclerotic heart disease of native coronary artery without angina pectoris: Secondary | ICD-10-CM | POA: Diagnosis not present

## 2022-01-12 DIAGNOSIS — G301 Alzheimer's disease with late onset: Secondary | ICD-10-CM | POA: Diagnosis not present

## 2022-01-12 DIAGNOSIS — E119 Type 2 diabetes mellitus without complications: Secondary | ICD-10-CM | POA: Diagnosis not present

## 2022-01-13 DIAGNOSIS — I10 Essential (primary) hypertension: Secondary | ICD-10-CM | POA: Diagnosis not present

## 2022-01-13 DIAGNOSIS — G301 Alzheimer's disease with late onset: Secondary | ICD-10-CM | POA: Diagnosis not present

## 2022-01-13 DIAGNOSIS — E119 Type 2 diabetes mellitus without complications: Secondary | ICD-10-CM | POA: Diagnosis not present

## 2022-01-13 DIAGNOSIS — Z66 Do not resuscitate: Secondary | ICD-10-CM | POA: Diagnosis not present

## 2022-01-13 DIAGNOSIS — I2510 Atherosclerotic heart disease of native coronary artery without angina pectoris: Secondary | ICD-10-CM | POA: Diagnosis not present

## 2022-01-13 DIAGNOSIS — F0390 Unspecified dementia without behavioral disturbance: Secondary | ICD-10-CM | POA: Diagnosis not present

## 2022-01-15 DIAGNOSIS — I10 Essential (primary) hypertension: Secondary | ICD-10-CM | POA: Diagnosis not present

## 2022-01-15 DIAGNOSIS — I2510 Atherosclerotic heart disease of native coronary artery without angina pectoris: Secondary | ICD-10-CM | POA: Diagnosis not present

## 2022-01-15 DIAGNOSIS — E119 Type 2 diabetes mellitus without complications: Secondary | ICD-10-CM | POA: Diagnosis not present

## 2022-01-15 DIAGNOSIS — Z66 Do not resuscitate: Secondary | ICD-10-CM | POA: Diagnosis not present

## 2022-01-15 DIAGNOSIS — G301 Alzheimer's disease with late onset: Secondary | ICD-10-CM | POA: Diagnosis not present

## 2022-01-15 DIAGNOSIS — F0390 Unspecified dementia without behavioral disturbance: Secondary | ICD-10-CM | POA: Diagnosis not present

## 2022-01-16 DIAGNOSIS — I2510 Atherosclerotic heart disease of native coronary artery without angina pectoris: Secondary | ICD-10-CM | POA: Diagnosis not present

## 2022-01-16 DIAGNOSIS — G301 Alzheimer's disease with late onset: Secondary | ICD-10-CM | POA: Diagnosis not present

## 2022-01-16 DIAGNOSIS — E119 Type 2 diabetes mellitus without complications: Secondary | ICD-10-CM | POA: Diagnosis not present

## 2022-01-16 DIAGNOSIS — Z66 Do not resuscitate: Secondary | ICD-10-CM | POA: Diagnosis not present

## 2022-01-16 DIAGNOSIS — I10 Essential (primary) hypertension: Secondary | ICD-10-CM | POA: Diagnosis not present

## 2022-01-16 DIAGNOSIS — F0390 Unspecified dementia without behavioral disturbance: Secondary | ICD-10-CM | POA: Diagnosis not present

## 2022-01-21 DIAGNOSIS — G301 Alzheimer's disease with late onset: Secondary | ICD-10-CM | POA: Diagnosis not present

## 2022-01-21 DIAGNOSIS — E119 Type 2 diabetes mellitus without complications: Secondary | ICD-10-CM | POA: Diagnosis not present

## 2022-01-21 DIAGNOSIS — I2510 Atherosclerotic heart disease of native coronary artery without angina pectoris: Secondary | ICD-10-CM | POA: Diagnosis not present

## 2022-01-21 DIAGNOSIS — I10 Essential (primary) hypertension: Secondary | ICD-10-CM | POA: Diagnosis not present

## 2022-01-21 DIAGNOSIS — Z66 Do not resuscitate: Secondary | ICD-10-CM | POA: Diagnosis not present

## 2022-01-21 DIAGNOSIS — F0390 Unspecified dementia without behavioral disturbance: Secondary | ICD-10-CM | POA: Diagnosis not present

## 2022-01-23 DIAGNOSIS — G301 Alzheimer's disease with late onset: Secondary | ICD-10-CM | POA: Diagnosis not present

## 2022-01-23 DIAGNOSIS — F0390 Unspecified dementia without behavioral disturbance: Secondary | ICD-10-CM | POA: Diagnosis not present

## 2022-01-23 DIAGNOSIS — Z66 Do not resuscitate: Secondary | ICD-10-CM | POA: Diagnosis not present

## 2022-01-23 DIAGNOSIS — I10 Essential (primary) hypertension: Secondary | ICD-10-CM | POA: Diagnosis not present

## 2022-01-23 DIAGNOSIS — I2510 Atherosclerotic heart disease of native coronary artery without angina pectoris: Secondary | ICD-10-CM | POA: Diagnosis not present

## 2022-01-23 DIAGNOSIS — E119 Type 2 diabetes mellitus without complications: Secondary | ICD-10-CM | POA: Diagnosis not present

## 2022-01-26 DIAGNOSIS — E119 Type 2 diabetes mellitus without complications: Secondary | ICD-10-CM | POA: Diagnosis not present

## 2022-01-26 DIAGNOSIS — G301 Alzheimer's disease with late onset: Secondary | ICD-10-CM | POA: Diagnosis not present

## 2022-01-26 DIAGNOSIS — Z66 Do not resuscitate: Secondary | ICD-10-CM | POA: Diagnosis not present

## 2022-01-26 DIAGNOSIS — I10 Essential (primary) hypertension: Secondary | ICD-10-CM | POA: Diagnosis not present

## 2022-01-26 DIAGNOSIS — F0390 Unspecified dementia without behavioral disturbance: Secondary | ICD-10-CM | POA: Diagnosis not present

## 2022-01-26 DIAGNOSIS — I2510 Atherosclerotic heart disease of native coronary artery without angina pectoris: Secondary | ICD-10-CM | POA: Diagnosis not present

## 2022-01-27 DIAGNOSIS — I2510 Atherosclerotic heart disease of native coronary artery without angina pectoris: Secondary | ICD-10-CM | POA: Diagnosis not present

## 2022-01-27 DIAGNOSIS — F0390 Unspecified dementia without behavioral disturbance: Secondary | ICD-10-CM | POA: Diagnosis not present

## 2022-01-27 DIAGNOSIS — I10 Essential (primary) hypertension: Secondary | ICD-10-CM | POA: Diagnosis not present

## 2022-01-27 DIAGNOSIS — E119 Type 2 diabetes mellitus without complications: Secondary | ICD-10-CM | POA: Diagnosis not present

## 2022-01-27 DIAGNOSIS — Z66 Do not resuscitate: Secondary | ICD-10-CM | POA: Diagnosis not present

## 2022-01-27 DIAGNOSIS — G301 Alzheimer's disease with late onset: Secondary | ICD-10-CM | POA: Diagnosis not present

## 2022-01-29 DIAGNOSIS — Z66 Do not resuscitate: Secondary | ICD-10-CM | POA: Diagnosis not present

## 2022-01-29 DIAGNOSIS — E119 Type 2 diabetes mellitus without complications: Secondary | ICD-10-CM | POA: Diagnosis not present

## 2022-01-29 DIAGNOSIS — I10 Essential (primary) hypertension: Secondary | ICD-10-CM | POA: Diagnosis not present

## 2022-01-29 DIAGNOSIS — G301 Alzheimer's disease with late onset: Secondary | ICD-10-CM | POA: Diagnosis not present

## 2022-01-29 DIAGNOSIS — I2510 Atherosclerotic heart disease of native coronary artery without angina pectoris: Secondary | ICD-10-CM | POA: Diagnosis not present

## 2022-01-29 DIAGNOSIS — F0390 Unspecified dementia without behavioral disturbance: Secondary | ICD-10-CM | POA: Diagnosis not present

## 2022-01-30 DIAGNOSIS — I10 Essential (primary) hypertension: Secondary | ICD-10-CM | POA: Diagnosis not present

## 2022-01-30 DIAGNOSIS — I2510 Atherosclerotic heart disease of native coronary artery without angina pectoris: Secondary | ICD-10-CM | POA: Diagnosis not present

## 2022-01-30 DIAGNOSIS — Z66 Do not resuscitate: Secondary | ICD-10-CM | POA: Diagnosis not present

## 2022-01-30 DIAGNOSIS — E119 Type 2 diabetes mellitus without complications: Secondary | ICD-10-CM | POA: Diagnosis not present

## 2022-01-30 DIAGNOSIS — F0390 Unspecified dementia without behavioral disturbance: Secondary | ICD-10-CM | POA: Diagnosis not present

## 2022-01-30 DIAGNOSIS — G301 Alzheimer's disease with late onset: Secondary | ICD-10-CM | POA: Diagnosis not present

## 2022-02-03 DIAGNOSIS — E119 Type 2 diabetes mellitus without complications: Secondary | ICD-10-CM | POA: Diagnosis not present

## 2022-02-03 DIAGNOSIS — F0390 Unspecified dementia without behavioral disturbance: Secondary | ICD-10-CM | POA: Diagnosis not present

## 2022-02-03 DIAGNOSIS — Z66 Do not resuscitate: Secondary | ICD-10-CM | POA: Diagnosis not present

## 2022-02-03 DIAGNOSIS — G301 Alzheimer's disease with late onset: Secondary | ICD-10-CM | POA: Diagnosis not present

## 2022-02-03 DIAGNOSIS — I2510 Atherosclerotic heart disease of native coronary artery without angina pectoris: Secondary | ICD-10-CM | POA: Diagnosis not present

## 2022-02-03 DIAGNOSIS — I10 Essential (primary) hypertension: Secondary | ICD-10-CM | POA: Diagnosis not present

## 2022-02-04 DIAGNOSIS — Z9181 History of falling: Secondary | ICD-10-CM | POA: Diagnosis not present

## 2022-02-04 DIAGNOSIS — R269 Unspecified abnormalities of gait and mobility: Secondary | ICD-10-CM | POA: Diagnosis not present

## 2022-02-04 DIAGNOSIS — M6281 Muscle weakness (generalized): Secondary | ICD-10-CM | POA: Diagnosis not present

## 2022-02-04 DIAGNOSIS — I2510 Atherosclerotic heart disease of native coronary artery without angina pectoris: Secondary | ICD-10-CM | POA: Diagnosis not present

## 2022-02-04 DIAGNOSIS — E119 Type 2 diabetes mellitus without complications: Secondary | ICD-10-CM | POA: Diagnosis not present

## 2022-02-04 DIAGNOSIS — F0390 Unspecified dementia without behavioral disturbance: Secondary | ICD-10-CM | POA: Diagnosis not present

## 2022-02-04 DIAGNOSIS — G309 Alzheimer's disease, unspecified: Secondary | ICD-10-CM | POA: Diagnosis not present

## 2022-02-04 DIAGNOSIS — R2689 Other abnormalities of gait and mobility: Secondary | ICD-10-CM | POA: Diagnosis not present

## 2022-02-04 DIAGNOSIS — G301 Alzheimer's disease with late onset: Secondary | ICD-10-CM | POA: Diagnosis not present

## 2022-02-04 DIAGNOSIS — W19XXXA Unspecified fall, initial encounter: Secondary | ICD-10-CM | POA: Diagnosis not present

## 2022-02-04 DIAGNOSIS — Z66 Do not resuscitate: Secondary | ICD-10-CM | POA: Diagnosis not present

## 2022-02-04 DIAGNOSIS — I10 Essential (primary) hypertension: Secondary | ICD-10-CM | POA: Diagnosis not present

## 2022-02-05 DIAGNOSIS — I10 Essential (primary) hypertension: Secondary | ICD-10-CM | POA: Diagnosis not present

## 2022-02-05 DIAGNOSIS — Z66 Do not resuscitate: Secondary | ICD-10-CM | POA: Diagnosis not present

## 2022-02-05 DIAGNOSIS — G301 Alzheimer's disease with late onset: Secondary | ICD-10-CM | POA: Diagnosis not present

## 2022-02-05 DIAGNOSIS — F0390 Unspecified dementia without behavioral disturbance: Secondary | ICD-10-CM | POA: Diagnosis not present

## 2022-02-05 DIAGNOSIS — I2510 Atherosclerotic heart disease of native coronary artery without angina pectoris: Secondary | ICD-10-CM | POA: Diagnosis not present

## 2022-02-05 DIAGNOSIS — E119 Type 2 diabetes mellitus without complications: Secondary | ICD-10-CM | POA: Diagnosis not present

## 2022-02-06 DIAGNOSIS — Z66 Do not resuscitate: Secondary | ICD-10-CM | POA: Diagnosis not present

## 2022-02-06 DIAGNOSIS — F0390 Unspecified dementia without behavioral disturbance: Secondary | ICD-10-CM | POA: Diagnosis not present

## 2022-02-06 DIAGNOSIS — E119 Type 2 diabetes mellitus without complications: Secondary | ICD-10-CM | POA: Diagnosis not present

## 2022-02-06 DIAGNOSIS — G301 Alzheimer's disease with late onset: Secondary | ICD-10-CM | POA: Diagnosis not present

## 2022-02-06 DIAGNOSIS — I10 Essential (primary) hypertension: Secondary | ICD-10-CM | POA: Diagnosis not present

## 2022-02-06 DIAGNOSIS — I2510 Atherosclerotic heart disease of native coronary artery without angina pectoris: Secondary | ICD-10-CM | POA: Diagnosis not present

## 2022-02-09 DIAGNOSIS — R269 Unspecified abnormalities of gait and mobility: Secondary | ICD-10-CM | POA: Diagnosis not present

## 2022-02-09 DIAGNOSIS — S0091XA Abrasion of unspecified part of head, initial encounter: Secondary | ICD-10-CM | POA: Diagnosis not present

## 2022-02-09 DIAGNOSIS — R2689 Other abnormalities of gait and mobility: Secondary | ICD-10-CM | POA: Diagnosis not present

## 2022-02-09 DIAGNOSIS — M6281 Muscle weakness (generalized): Secondary | ICD-10-CM | POA: Diagnosis not present

## 2022-02-09 DIAGNOSIS — G309 Alzheimer's disease, unspecified: Secondary | ICD-10-CM | POA: Diagnosis not present

## 2022-02-09 DIAGNOSIS — W19XXXA Unspecified fall, initial encounter: Secondary | ICD-10-CM | POA: Diagnosis not present

## 2022-02-10 DIAGNOSIS — I10 Essential (primary) hypertension: Secondary | ICD-10-CM | POA: Diagnosis not present

## 2022-02-10 DIAGNOSIS — Z66 Do not resuscitate: Secondary | ICD-10-CM | POA: Diagnosis not present

## 2022-02-10 DIAGNOSIS — F0390 Unspecified dementia without behavioral disturbance: Secondary | ICD-10-CM | POA: Diagnosis not present

## 2022-02-10 DIAGNOSIS — E119 Type 2 diabetes mellitus without complications: Secondary | ICD-10-CM | POA: Diagnosis not present

## 2022-02-10 DIAGNOSIS — F5102 Adjustment insomnia: Secondary | ICD-10-CM | POA: Diagnosis not present

## 2022-02-10 DIAGNOSIS — I2510 Atherosclerotic heart disease of native coronary artery without angina pectoris: Secondary | ICD-10-CM | POA: Diagnosis not present

## 2022-02-10 DIAGNOSIS — F03918 Unspecified dementia, unspecified severity, with other behavioral disturbance: Secondary | ICD-10-CM | POA: Diagnosis not present

## 2022-02-10 DIAGNOSIS — G301 Alzheimer's disease with late onset: Secondary | ICD-10-CM | POA: Diagnosis not present

## 2022-02-10 DIAGNOSIS — F419 Anxiety disorder, unspecified: Secondary | ICD-10-CM | POA: Diagnosis not present

## 2022-02-12 DIAGNOSIS — G301 Alzheimer's disease with late onset: Secondary | ICD-10-CM | POA: Diagnosis not present

## 2022-02-12 DIAGNOSIS — I10 Essential (primary) hypertension: Secondary | ICD-10-CM | POA: Diagnosis not present

## 2022-02-12 DIAGNOSIS — Z66 Do not resuscitate: Secondary | ICD-10-CM | POA: Diagnosis not present

## 2022-02-12 DIAGNOSIS — F0390 Unspecified dementia without behavioral disturbance: Secondary | ICD-10-CM | POA: Diagnosis not present

## 2022-02-12 DIAGNOSIS — E119 Type 2 diabetes mellitus without complications: Secondary | ICD-10-CM | POA: Diagnosis not present

## 2022-02-12 DIAGNOSIS — I2510 Atherosclerotic heart disease of native coronary artery without angina pectoris: Secondary | ICD-10-CM | POA: Diagnosis not present

## 2022-02-13 DIAGNOSIS — F0390 Unspecified dementia without behavioral disturbance: Secondary | ICD-10-CM | POA: Diagnosis not present

## 2022-02-13 DIAGNOSIS — I10 Essential (primary) hypertension: Secondary | ICD-10-CM | POA: Diagnosis not present

## 2022-02-13 DIAGNOSIS — G301 Alzheimer's disease with late onset: Secondary | ICD-10-CM | POA: Diagnosis not present

## 2022-02-13 DIAGNOSIS — Z66 Do not resuscitate: Secondary | ICD-10-CM | POA: Diagnosis not present

## 2022-02-13 DIAGNOSIS — I2510 Atherosclerotic heart disease of native coronary artery without angina pectoris: Secondary | ICD-10-CM | POA: Diagnosis not present

## 2022-02-13 DIAGNOSIS — E119 Type 2 diabetes mellitus without complications: Secondary | ICD-10-CM | POA: Diagnosis not present

## 2022-02-17 DIAGNOSIS — Z66 Do not resuscitate: Secondary | ICD-10-CM | POA: Diagnosis not present

## 2022-02-17 DIAGNOSIS — G301 Alzheimer's disease with late onset: Secondary | ICD-10-CM | POA: Diagnosis not present

## 2022-02-17 DIAGNOSIS — F0390 Unspecified dementia without behavioral disturbance: Secondary | ICD-10-CM | POA: Diagnosis not present

## 2022-02-17 DIAGNOSIS — E119 Type 2 diabetes mellitus without complications: Secondary | ICD-10-CM | POA: Diagnosis not present

## 2022-02-17 DIAGNOSIS — I10 Essential (primary) hypertension: Secondary | ICD-10-CM | POA: Diagnosis not present

## 2022-02-17 DIAGNOSIS — I2510 Atherosclerotic heart disease of native coronary artery without angina pectoris: Secondary | ICD-10-CM | POA: Diagnosis not present

## 2022-02-19 DIAGNOSIS — I10 Essential (primary) hypertension: Secondary | ICD-10-CM | POA: Diagnosis not present

## 2022-02-19 DIAGNOSIS — F419 Anxiety disorder, unspecified: Secondary | ICD-10-CM | POA: Diagnosis not present

## 2022-02-19 DIAGNOSIS — F02C18 Dementia in other diseases classified elsewhere, severe, with other behavioral disturbance: Secondary | ICD-10-CM | POA: Diagnosis not present

## 2022-02-19 DIAGNOSIS — F0390 Unspecified dementia without behavioral disturbance: Secondary | ICD-10-CM | POA: Diagnosis not present

## 2022-02-19 DIAGNOSIS — F5102 Adjustment insomnia: Secondary | ICD-10-CM | POA: Diagnosis not present

## 2022-02-19 DIAGNOSIS — Z66 Do not resuscitate: Secondary | ICD-10-CM | POA: Diagnosis not present

## 2022-02-19 DIAGNOSIS — G309 Alzheimer's disease, unspecified: Secondary | ICD-10-CM | POA: Diagnosis not present

## 2022-02-19 DIAGNOSIS — E119 Type 2 diabetes mellitus without complications: Secondary | ICD-10-CM | POA: Diagnosis not present

## 2022-02-19 DIAGNOSIS — G301 Alzheimer's disease with late onset: Secondary | ICD-10-CM | POA: Diagnosis not present

## 2022-02-19 DIAGNOSIS — I2510 Atherosclerotic heart disease of native coronary artery without angina pectoris: Secondary | ICD-10-CM | POA: Diagnosis not present

## 2022-02-20 DIAGNOSIS — F0390 Unspecified dementia without behavioral disturbance: Secondary | ICD-10-CM | POA: Diagnosis not present

## 2022-02-20 DIAGNOSIS — I10 Essential (primary) hypertension: Secondary | ICD-10-CM | POA: Diagnosis not present

## 2022-02-20 DIAGNOSIS — G301 Alzheimer's disease with late onset: Secondary | ICD-10-CM | POA: Diagnosis not present

## 2022-02-20 DIAGNOSIS — E119 Type 2 diabetes mellitus without complications: Secondary | ICD-10-CM | POA: Diagnosis not present

## 2022-02-20 DIAGNOSIS — I2510 Atherosclerotic heart disease of native coronary artery without angina pectoris: Secondary | ICD-10-CM | POA: Diagnosis not present

## 2022-02-20 DIAGNOSIS — Z66 Do not resuscitate: Secondary | ICD-10-CM | POA: Diagnosis not present

## 2022-02-23 DIAGNOSIS — R569 Unspecified convulsions: Secondary | ICD-10-CM | POA: Diagnosis not present

## 2022-02-23 DIAGNOSIS — I739 Peripheral vascular disease, unspecified: Secondary | ICD-10-CM | POA: Diagnosis not present

## 2022-02-23 DIAGNOSIS — R269 Unspecified abnormalities of gait and mobility: Secondary | ICD-10-CM | POA: Diagnosis not present

## 2022-02-23 DIAGNOSIS — M6281 Muscle weakness (generalized): Secondary | ICD-10-CM | POA: Diagnosis not present

## 2022-02-23 DIAGNOSIS — F0390 Unspecified dementia without behavioral disturbance: Secondary | ICD-10-CM | POA: Diagnosis not present

## 2022-02-23 DIAGNOSIS — E1151 Type 2 diabetes mellitus with diabetic peripheral angiopathy without gangrene: Secondary | ICD-10-CM | POA: Diagnosis not present

## 2022-02-23 DIAGNOSIS — I2510 Atherosclerotic heart disease of native coronary artery without angina pectoris: Secondary | ICD-10-CM | POA: Diagnosis not present

## 2022-02-23 DIAGNOSIS — I119 Hypertensive heart disease without heart failure: Secondary | ICD-10-CM | POA: Diagnosis not present

## 2022-02-23 DIAGNOSIS — D649 Anemia, unspecified: Secondary | ICD-10-CM | POA: Diagnosis not present

## 2022-02-23 DIAGNOSIS — Z6823 Body mass index (BMI) 23.0-23.9, adult: Secondary | ICD-10-CM | POA: Diagnosis not present

## 2022-02-24 DIAGNOSIS — I2510 Atherosclerotic heart disease of native coronary artery without angina pectoris: Secondary | ICD-10-CM | POA: Diagnosis not present

## 2022-02-24 DIAGNOSIS — I10 Essential (primary) hypertension: Secondary | ICD-10-CM | POA: Diagnosis not present

## 2022-02-24 DIAGNOSIS — G4489 Other headache syndrome: Secondary | ICD-10-CM | POA: Diagnosis not present

## 2022-02-24 DIAGNOSIS — S0990XA Unspecified injury of head, initial encounter: Secondary | ICD-10-CM | POA: Diagnosis not present

## 2022-02-24 DIAGNOSIS — S199XXA Unspecified injury of neck, initial encounter: Secondary | ICD-10-CM | POA: Diagnosis not present

## 2022-02-24 DIAGNOSIS — E119 Type 2 diabetes mellitus without complications: Secondary | ICD-10-CM | POA: Diagnosis not present

## 2022-02-24 DIAGNOSIS — W19XXXA Unspecified fall, initial encounter: Secondary | ICD-10-CM | POA: Diagnosis not present

## 2022-02-24 DIAGNOSIS — F03C0 Unspecified dementia, severe, without behavioral disturbance, psychotic disturbance, mood disturbance, and anxiety: Secondary | ICD-10-CM | POA: Diagnosis not present

## 2022-02-24 DIAGNOSIS — G319 Degenerative disease of nervous system, unspecified: Secondary | ICD-10-CM | POA: Diagnosis not present

## 2022-02-24 DIAGNOSIS — M47812 Spondylosis without myelopathy or radiculopathy, cervical region: Secondary | ICD-10-CM | POA: Diagnosis not present

## 2022-02-24 DIAGNOSIS — Z66 Do not resuscitate: Secondary | ICD-10-CM | POA: Diagnosis not present

## 2022-02-24 DIAGNOSIS — I6529 Occlusion and stenosis of unspecified carotid artery: Secondary | ICD-10-CM | POA: Diagnosis not present

## 2022-02-24 DIAGNOSIS — G301 Alzheimer's disease with late onset: Secondary | ICD-10-CM | POA: Diagnosis not present

## 2022-02-24 DIAGNOSIS — I6782 Cerebral ischemia: Secondary | ICD-10-CM | POA: Diagnosis not present

## 2022-02-24 DIAGNOSIS — F0390 Unspecified dementia without behavioral disturbance: Secondary | ICD-10-CM | POA: Diagnosis not present

## 2022-02-25 DIAGNOSIS — R531 Weakness: Secondary | ICD-10-CM | POA: Diagnosis not present

## 2022-02-25 DIAGNOSIS — M6281 Muscle weakness (generalized): Secondary | ICD-10-CM | POA: Diagnosis not present

## 2022-02-25 DIAGNOSIS — F0390 Unspecified dementia without behavioral disturbance: Secondary | ICD-10-CM | POA: Diagnosis not present

## 2022-02-25 DIAGNOSIS — S060X0A Concussion without loss of consciousness, initial encounter: Secondary | ICD-10-CM | POA: Diagnosis not present

## 2022-02-25 DIAGNOSIS — W19XXXA Unspecified fall, initial encounter: Secondary | ICD-10-CM | POA: Diagnosis not present

## 2022-02-25 DIAGNOSIS — I119 Hypertensive heart disease without heart failure: Secondary | ICD-10-CM | POA: Diagnosis not present

## 2022-02-25 DIAGNOSIS — Z9181 History of falling: Secondary | ICD-10-CM | POA: Diagnosis not present

## 2022-02-25 DIAGNOSIS — R2689 Other abnormalities of gait and mobility: Secondary | ICD-10-CM | POA: Diagnosis not present

## 2022-02-25 DIAGNOSIS — Z743 Need for continuous supervision: Secondary | ICD-10-CM | POA: Diagnosis not present

## 2022-02-26 DIAGNOSIS — F0390 Unspecified dementia without behavioral disturbance: Secondary | ICD-10-CM | POA: Diagnosis not present

## 2022-02-26 DIAGNOSIS — I2510 Atherosclerotic heart disease of native coronary artery without angina pectoris: Secondary | ICD-10-CM | POA: Diagnosis not present

## 2022-02-26 DIAGNOSIS — I10 Essential (primary) hypertension: Secondary | ICD-10-CM | POA: Diagnosis not present

## 2022-02-26 DIAGNOSIS — Z66 Do not resuscitate: Secondary | ICD-10-CM | POA: Diagnosis not present

## 2022-02-26 DIAGNOSIS — E119 Type 2 diabetes mellitus without complications: Secondary | ICD-10-CM | POA: Diagnosis not present

## 2022-02-26 DIAGNOSIS — G301 Alzheimer's disease with late onset: Secondary | ICD-10-CM | POA: Diagnosis not present

## 2022-02-27 DIAGNOSIS — G301 Alzheimer's disease with late onset: Secondary | ICD-10-CM | POA: Diagnosis not present

## 2022-02-27 DIAGNOSIS — I2510 Atherosclerotic heart disease of native coronary artery without angina pectoris: Secondary | ICD-10-CM | POA: Diagnosis not present

## 2022-02-27 DIAGNOSIS — F0390 Unspecified dementia without behavioral disturbance: Secondary | ICD-10-CM | POA: Diagnosis not present

## 2022-02-27 DIAGNOSIS — I10 Essential (primary) hypertension: Secondary | ICD-10-CM | POA: Diagnosis not present

## 2022-02-27 DIAGNOSIS — Z66 Do not resuscitate: Secondary | ICD-10-CM | POA: Diagnosis not present

## 2022-02-27 DIAGNOSIS — E119 Type 2 diabetes mellitus without complications: Secondary | ICD-10-CM | POA: Diagnosis not present

## 2022-03-03 DIAGNOSIS — I2510 Atherosclerotic heart disease of native coronary artery without angina pectoris: Secondary | ICD-10-CM | POA: Diagnosis not present

## 2022-03-03 DIAGNOSIS — G301 Alzheimer's disease with late onset: Secondary | ICD-10-CM | POA: Diagnosis not present

## 2022-03-03 DIAGNOSIS — I10 Essential (primary) hypertension: Secondary | ICD-10-CM | POA: Diagnosis not present

## 2022-03-03 DIAGNOSIS — F0390 Unspecified dementia without behavioral disturbance: Secondary | ICD-10-CM | POA: Diagnosis not present

## 2022-03-03 DIAGNOSIS — Z66 Do not resuscitate: Secondary | ICD-10-CM | POA: Diagnosis not present

## 2022-03-03 DIAGNOSIS — E119 Type 2 diabetes mellitus without complications: Secondary | ICD-10-CM | POA: Diagnosis not present

## 2022-03-04 DIAGNOSIS — G309 Alzheimer's disease, unspecified: Secondary | ICD-10-CM | POA: Diagnosis not present

## 2022-03-04 DIAGNOSIS — I10 Essential (primary) hypertension: Secondary | ICD-10-CM | POA: Diagnosis not present

## 2022-03-04 DIAGNOSIS — I2510 Atherosclerotic heart disease of native coronary artery without angina pectoris: Secondary | ICD-10-CM | POA: Diagnosis not present

## 2022-03-04 DIAGNOSIS — E119 Type 2 diabetes mellitus without complications: Secondary | ICD-10-CM | POA: Diagnosis not present

## 2022-03-04 DIAGNOSIS — G301 Alzheimer's disease with late onset: Secondary | ICD-10-CM | POA: Diagnosis not present

## 2022-03-04 DIAGNOSIS — F0390 Unspecified dementia without behavioral disturbance: Secondary | ICD-10-CM | POA: Diagnosis not present

## 2022-03-04 DIAGNOSIS — R634 Abnormal weight loss: Secondary | ICD-10-CM | POA: Diagnosis not present

## 2022-03-04 DIAGNOSIS — Z66 Do not resuscitate: Secondary | ICD-10-CM | POA: Diagnosis not present

## 2022-03-04 DIAGNOSIS — R4181 Age-related cognitive decline: Secondary | ICD-10-CM | POA: Diagnosis not present

## 2022-03-05 DIAGNOSIS — F0390 Unspecified dementia without behavioral disturbance: Secondary | ICD-10-CM | POA: Diagnosis not present

## 2022-03-05 DIAGNOSIS — G301 Alzheimer's disease with late onset: Secondary | ICD-10-CM | POA: Diagnosis not present

## 2022-03-05 DIAGNOSIS — Z66 Do not resuscitate: Secondary | ICD-10-CM | POA: Diagnosis not present

## 2022-03-05 DIAGNOSIS — I2510 Atherosclerotic heart disease of native coronary artery without angina pectoris: Secondary | ICD-10-CM | POA: Diagnosis not present

## 2022-03-05 DIAGNOSIS — E119 Type 2 diabetes mellitus without complications: Secondary | ICD-10-CM | POA: Diagnosis not present

## 2022-03-05 DIAGNOSIS — I10 Essential (primary) hypertension: Secondary | ICD-10-CM | POA: Diagnosis not present

## 2022-03-06 DIAGNOSIS — G301 Alzheimer's disease with late onset: Secondary | ICD-10-CM | POA: Diagnosis not present

## 2022-03-06 DIAGNOSIS — F0390 Unspecified dementia without behavioral disturbance: Secondary | ICD-10-CM | POA: Diagnosis not present

## 2022-03-06 DIAGNOSIS — Z66 Do not resuscitate: Secondary | ICD-10-CM | POA: Diagnosis not present

## 2022-03-06 DIAGNOSIS — E119 Type 2 diabetes mellitus without complications: Secondary | ICD-10-CM | POA: Diagnosis not present

## 2022-03-06 DIAGNOSIS — I2510 Atherosclerotic heart disease of native coronary artery without angina pectoris: Secondary | ICD-10-CM | POA: Diagnosis not present

## 2022-03-06 DIAGNOSIS — I10 Essential (primary) hypertension: Secondary | ICD-10-CM | POA: Diagnosis not present

## 2022-03-10 DIAGNOSIS — G301 Alzheimer's disease with late onset: Secondary | ICD-10-CM | POA: Diagnosis not present

## 2022-03-10 DIAGNOSIS — E119 Type 2 diabetes mellitus without complications: Secondary | ICD-10-CM | POA: Diagnosis not present

## 2022-03-10 DIAGNOSIS — F0390 Unspecified dementia without behavioral disturbance: Secondary | ICD-10-CM | POA: Diagnosis not present

## 2022-03-10 DIAGNOSIS — I2510 Atherosclerotic heart disease of native coronary artery without angina pectoris: Secondary | ICD-10-CM | POA: Diagnosis not present

## 2022-03-10 DIAGNOSIS — Z66 Do not resuscitate: Secondary | ICD-10-CM | POA: Diagnosis not present

## 2022-03-10 DIAGNOSIS — I10 Essential (primary) hypertension: Secondary | ICD-10-CM | POA: Diagnosis not present

## 2022-03-11 DIAGNOSIS — Z66 Do not resuscitate: Secondary | ICD-10-CM | POA: Diagnosis not present

## 2022-03-11 DIAGNOSIS — G301 Alzheimer's disease with late onset: Secondary | ICD-10-CM | POA: Diagnosis not present

## 2022-03-11 DIAGNOSIS — I10 Essential (primary) hypertension: Secondary | ICD-10-CM | POA: Diagnosis not present

## 2022-03-11 DIAGNOSIS — I2510 Atherosclerotic heart disease of native coronary artery without angina pectoris: Secondary | ICD-10-CM | POA: Diagnosis not present

## 2022-03-11 DIAGNOSIS — F0390 Unspecified dementia without behavioral disturbance: Secondary | ICD-10-CM | POA: Diagnosis not present

## 2022-03-11 DIAGNOSIS — E119 Type 2 diabetes mellitus without complications: Secondary | ICD-10-CM | POA: Diagnosis not present

## 2022-03-12 DIAGNOSIS — G301 Alzheimer's disease with late onset: Secondary | ICD-10-CM | POA: Diagnosis not present

## 2022-03-12 DIAGNOSIS — I10 Essential (primary) hypertension: Secondary | ICD-10-CM | POA: Diagnosis not present

## 2022-03-12 DIAGNOSIS — F0390 Unspecified dementia without behavioral disturbance: Secondary | ICD-10-CM | POA: Diagnosis not present

## 2022-03-12 DIAGNOSIS — Z66 Do not resuscitate: Secondary | ICD-10-CM | POA: Diagnosis not present

## 2022-03-12 DIAGNOSIS — E119 Type 2 diabetes mellitus without complications: Secondary | ICD-10-CM | POA: Diagnosis not present

## 2022-03-12 DIAGNOSIS — I2510 Atherosclerotic heart disease of native coronary artery without angina pectoris: Secondary | ICD-10-CM | POA: Diagnosis not present

## 2022-03-13 DIAGNOSIS — F0390 Unspecified dementia without behavioral disturbance: Secondary | ICD-10-CM | POA: Diagnosis not present

## 2022-03-13 DIAGNOSIS — E119 Type 2 diabetes mellitus without complications: Secondary | ICD-10-CM | POA: Diagnosis not present

## 2022-03-13 DIAGNOSIS — I10 Essential (primary) hypertension: Secondary | ICD-10-CM | POA: Diagnosis not present

## 2022-03-13 DIAGNOSIS — G301 Alzheimer's disease with late onset: Secondary | ICD-10-CM | POA: Diagnosis not present

## 2022-03-13 DIAGNOSIS — L603 Nail dystrophy: Secondary | ICD-10-CM | POA: Diagnosis not present

## 2022-03-13 DIAGNOSIS — Z66 Do not resuscitate: Secondary | ICD-10-CM | POA: Diagnosis not present

## 2022-03-13 DIAGNOSIS — I2510 Atherosclerotic heart disease of native coronary artery without angina pectoris: Secondary | ICD-10-CM | POA: Diagnosis not present

## 2022-03-17 DIAGNOSIS — E119 Type 2 diabetes mellitus without complications: Secondary | ICD-10-CM | POA: Diagnosis not present

## 2022-03-17 DIAGNOSIS — F5102 Adjustment insomnia: Secondary | ICD-10-CM | POA: Diagnosis not present

## 2022-03-17 DIAGNOSIS — E860 Dehydration: Secondary | ICD-10-CM | POA: Diagnosis not present

## 2022-03-17 DIAGNOSIS — R0789 Other chest pain: Secondary | ICD-10-CM | POA: Diagnosis not present

## 2022-03-17 DIAGNOSIS — R509 Fever, unspecified: Secondary | ICD-10-CM | POA: Diagnosis not present

## 2022-03-17 DIAGNOSIS — U071 COVID-19: Secondary | ICD-10-CM | POA: Diagnosis not present

## 2022-03-17 DIAGNOSIS — I10 Essential (primary) hypertension: Secondary | ICD-10-CM | POA: Diagnosis not present

## 2022-03-17 DIAGNOSIS — I4510 Unspecified right bundle-branch block: Secondary | ICD-10-CM | POA: Diagnosis not present

## 2022-03-17 DIAGNOSIS — F02C18 Dementia in other diseases classified elsewhere, severe, with other behavioral disturbance: Secondary | ICD-10-CM | POA: Diagnosis not present

## 2022-03-17 DIAGNOSIS — F419 Anxiety disorder, unspecified: Secondary | ICD-10-CM | POA: Diagnosis not present

## 2022-03-17 DIAGNOSIS — R000 Tachycardia, unspecified: Secondary | ICD-10-CM | POA: Diagnosis not present

## 2022-03-17 DIAGNOSIS — Z66 Do not resuscitate: Secondary | ICD-10-CM | POA: Diagnosis not present

## 2022-03-17 DIAGNOSIS — I2510 Atherosclerotic heart disease of native coronary artery without angina pectoris: Secondary | ICD-10-CM | POA: Diagnosis not present

## 2022-03-17 DIAGNOSIS — F03918 Unspecified dementia, unspecified severity, with other behavioral disturbance: Secondary | ICD-10-CM | POA: Diagnosis not present

## 2022-03-17 DIAGNOSIS — R778 Other specified abnormalities of plasma proteins: Secondary | ICD-10-CM | POA: Diagnosis not present

## 2022-03-17 DIAGNOSIS — R7401 Elevation of levels of liver transaminase levels: Secondary | ICD-10-CM | POA: Diagnosis not present

## 2022-03-17 DIAGNOSIS — R251 Tremor, unspecified: Secondary | ICD-10-CM | POA: Diagnosis not present

## 2022-03-17 DIAGNOSIS — G301 Alzheimer's disease with late onset: Secondary | ICD-10-CM | POA: Diagnosis not present

## 2022-03-17 DIAGNOSIS — G309 Alzheimer's disease, unspecified: Secondary | ICD-10-CM | POA: Diagnosis not present

## 2022-03-17 DIAGNOSIS — F0390 Unspecified dementia without behavioral disturbance: Secondary | ICD-10-CM | POA: Diagnosis not present

## 2022-03-17 DIAGNOSIS — R079 Chest pain, unspecified: Secondary | ICD-10-CM | POA: Diagnosis not present

## 2022-03-17 DIAGNOSIS — I639 Cerebral infarction, unspecified: Secondary | ICD-10-CM | POA: Diagnosis not present

## 2022-03-18 DIAGNOSIS — U071 COVID-19: Secondary | ICD-10-CM | POA: Diagnosis not present

## 2022-03-18 DIAGNOSIS — J9601 Acute respiratory failure with hypoxia: Secondary | ICD-10-CM | POA: Diagnosis not present

## 2022-03-18 DIAGNOSIS — G9341 Metabolic encephalopathy: Secondary | ICD-10-CM | POA: Diagnosis not present

## 2022-03-19 DIAGNOSIS — J159 Unspecified bacterial pneumonia: Secondary | ICD-10-CM | POA: Diagnosis not present

## 2022-03-19 DIAGNOSIS — Z9981 Dependence on supplemental oxygen: Secondary | ICD-10-CM | POA: Diagnosis not present

## 2022-03-19 DIAGNOSIS — U071 COVID-19: Secondary | ICD-10-CM | POA: Diagnosis not present

## 2022-03-19 DIAGNOSIS — J690 Pneumonitis due to inhalation of food and vomit: Secondary | ICD-10-CM | POA: Diagnosis not present

## 2022-03-19 DIAGNOSIS — I4510 Unspecified right bundle-branch block: Secondary | ICD-10-CM | POA: Diagnosis not present

## 2022-03-19 DIAGNOSIS — I252 Old myocardial infarction: Secondary | ICD-10-CM | POA: Diagnosis not present

## 2022-03-19 DIAGNOSIS — F32A Depression, unspecified: Secondary | ICD-10-CM | POA: Diagnosis not present

## 2022-03-19 DIAGNOSIS — E860 Dehydration: Secondary | ICD-10-CM | POA: Diagnosis not present

## 2022-03-19 DIAGNOSIS — J9601 Acute respiratory failure with hypoxia: Secondary | ICD-10-CM | POA: Diagnosis not present

## 2022-03-19 DIAGNOSIS — Z792 Long term (current) use of antibiotics: Secondary | ICD-10-CM | POA: Diagnosis not present

## 2022-03-19 DIAGNOSIS — R279 Unspecified lack of coordination: Secondary | ICD-10-CM | POA: Diagnosis not present

## 2022-03-19 DIAGNOSIS — F419 Anxiety disorder, unspecified: Secondary | ICD-10-CM | POA: Diagnosis not present

## 2022-03-19 DIAGNOSIS — I2510 Atherosclerotic heart disease of native coronary artery without angina pectoris: Secondary | ICD-10-CM | POA: Diagnosis not present

## 2022-03-19 DIAGNOSIS — R0602 Shortness of breath: Secondary | ICD-10-CM | POA: Diagnosis not present

## 2022-03-19 DIAGNOSIS — R0682 Tachypnea, not elsewhere classified: Secondary | ICD-10-CM | POA: Diagnosis not present

## 2022-03-19 DIAGNOSIS — I10 Essential (primary) hypertension: Secondary | ICD-10-CM | POA: Diagnosis not present

## 2022-03-19 DIAGNOSIS — Z7952 Long term (current) use of systemic steroids: Secondary | ICD-10-CM | POA: Diagnosis not present

## 2022-03-19 DIAGNOSIS — Z8701 Personal history of pneumonia (recurrent): Secondary | ICD-10-CM | POA: Diagnosis not present

## 2022-03-19 DIAGNOSIS — Z79899 Other long term (current) drug therapy: Secondary | ICD-10-CM | POA: Diagnosis not present

## 2022-03-19 DIAGNOSIS — Z87891 Personal history of nicotine dependence: Secondary | ICD-10-CM | POA: Diagnosis not present

## 2022-03-19 DIAGNOSIS — R531 Weakness: Secondary | ICD-10-CM | POA: Diagnosis not present

## 2022-03-19 DIAGNOSIS — Z66 Do not resuscitate: Secondary | ICD-10-CM | POA: Diagnosis not present

## 2022-03-19 DIAGNOSIS — E785 Hyperlipidemia, unspecified: Secondary | ICD-10-CM | POA: Diagnosis not present

## 2022-03-19 DIAGNOSIS — Z7401 Bed confinement status: Secondary | ICD-10-CM | POA: Diagnosis not present

## 2022-03-19 DIAGNOSIS — E876 Hypokalemia: Secondary | ICD-10-CM | POA: Diagnosis not present

## 2022-03-19 DIAGNOSIS — R5381 Other malaise: Secondary | ICD-10-CM | POA: Diagnosis not present

## 2022-03-19 DIAGNOSIS — I471 Supraventricular tachycardia, unspecified: Secondary | ICD-10-CM | POA: Diagnosis not present

## 2022-03-19 DIAGNOSIS — G9341 Metabolic encephalopathy: Secondary | ICD-10-CM | POA: Diagnosis not present

## 2022-03-19 DIAGNOSIS — F0390 Unspecified dementia without behavioral disturbance: Secondary | ICD-10-CM | POA: Diagnosis not present

## 2022-03-19 DIAGNOSIS — Z7982 Long term (current) use of aspirin: Secondary | ICD-10-CM | POA: Diagnosis not present

## 2022-03-27 DIAGNOSIS — G9341 Metabolic encephalopathy: Secondary | ICD-10-CM | POA: Diagnosis not present

## 2022-03-27 DIAGNOSIS — E860 Dehydration: Secondary | ICD-10-CM | POA: Diagnosis not present

## 2022-03-27 DIAGNOSIS — R4181 Age-related cognitive decline: Secondary | ICD-10-CM

## 2022-03-27 DIAGNOSIS — F0390 Unspecified dementia without behavioral disturbance: Secondary | ICD-10-CM | POA: Diagnosis not present

## 2022-03-27 DIAGNOSIS — R269 Unspecified abnormalities of gait and mobility: Secondary | ICD-10-CM

## 2022-03-27 DIAGNOSIS — G309 Alzheimer's disease, unspecified: Secondary | ICD-10-CM

## 2022-03-27 DIAGNOSIS — G301 Alzheimer's disease with late onset: Secondary | ICD-10-CM | POA: Diagnosis not present

## 2022-03-27 DIAGNOSIS — U071 COVID-19: Secondary | ICD-10-CM | POA: Diagnosis not present

## 2022-03-27 DIAGNOSIS — Z66 Do not resuscitate: Secondary | ICD-10-CM | POA: Diagnosis not present

## 2022-03-27 DIAGNOSIS — I2510 Atherosclerotic heart disease of native coronary artery without angina pectoris: Secondary | ICD-10-CM | POA: Diagnosis not present

## 2022-03-27 DIAGNOSIS — J9601 Acute respiratory failure with hypoxia: Secondary | ICD-10-CM | POA: Diagnosis not present

## 2022-03-27 DIAGNOSIS — M6281 Muscle weakness (generalized): Secondary | ICD-10-CM | POA: Diagnosis not present

## 2022-03-27 DIAGNOSIS — E119 Type 2 diabetes mellitus without complications: Secondary | ICD-10-CM | POA: Diagnosis not present

## 2022-03-28 DIAGNOSIS — Z66 Do not resuscitate: Secondary | ICD-10-CM | POA: Diagnosis not present

## 2022-03-28 DIAGNOSIS — I2510 Atherosclerotic heart disease of native coronary artery without angina pectoris: Secondary | ICD-10-CM | POA: Diagnosis not present

## 2022-03-28 DIAGNOSIS — F0390 Unspecified dementia without behavioral disturbance: Secondary | ICD-10-CM | POA: Diagnosis not present

## 2022-03-28 DIAGNOSIS — G301 Alzheimer's disease with late onset: Secondary | ICD-10-CM | POA: Diagnosis not present

## 2022-03-28 DIAGNOSIS — E119 Type 2 diabetes mellitus without complications: Secondary | ICD-10-CM | POA: Diagnosis not present

## 2022-03-29 DIAGNOSIS — G301 Alzheimer's disease with late onset: Secondary | ICD-10-CM | POA: Diagnosis not present

## 2022-03-29 DIAGNOSIS — E119 Type 2 diabetes mellitus without complications: Secondary | ICD-10-CM | POA: Diagnosis not present

## 2022-03-29 DIAGNOSIS — F0390 Unspecified dementia without behavioral disturbance: Secondary | ICD-10-CM | POA: Diagnosis not present

## 2022-03-29 DIAGNOSIS — I2510 Atherosclerotic heart disease of native coronary artery without angina pectoris: Secondary | ICD-10-CM | POA: Diagnosis not present

## 2022-03-29 DIAGNOSIS — Z66 Do not resuscitate: Secondary | ICD-10-CM | POA: Diagnosis not present

## 2022-03-30 DIAGNOSIS — G309 Alzheimer's disease, unspecified: Secondary | ICD-10-CM | POA: Diagnosis not present

## 2022-03-30 DIAGNOSIS — R269 Unspecified abnormalities of gait and mobility: Secondary | ICD-10-CM | POA: Diagnosis not present

## 2022-03-30 DIAGNOSIS — I2510 Atherosclerotic heart disease of native coronary artery without angina pectoris: Secondary | ICD-10-CM | POA: Diagnosis not present

## 2022-03-30 DIAGNOSIS — G301 Alzheimer's disease with late onset: Secondary | ICD-10-CM | POA: Diagnosis not present

## 2022-03-30 DIAGNOSIS — F0390 Unspecified dementia without behavioral disturbance: Secondary | ICD-10-CM | POA: Diagnosis not present

## 2022-03-30 DIAGNOSIS — Z66 Do not resuscitate: Secondary | ICD-10-CM | POA: Diagnosis not present

## 2022-03-30 DIAGNOSIS — R2689 Other abnormalities of gait and mobility: Secondary | ICD-10-CM | POA: Diagnosis not present

## 2022-03-30 DIAGNOSIS — M6281 Muscle weakness (generalized): Secondary | ICD-10-CM | POA: Diagnosis not present

## 2022-03-30 DIAGNOSIS — Z9181 History of falling: Secondary | ICD-10-CM

## 2022-03-30 DIAGNOSIS — W19XXXA Unspecified fall, initial encounter: Secondary | ICD-10-CM

## 2022-03-30 DIAGNOSIS — E119 Type 2 diabetes mellitus without complications: Secondary | ICD-10-CM | POA: Diagnosis not present

## 2022-03-31 DIAGNOSIS — G301 Alzheimer's disease with late onset: Secondary | ICD-10-CM | POA: Diagnosis not present

## 2022-03-31 DIAGNOSIS — Z66 Do not resuscitate: Secondary | ICD-10-CM | POA: Diagnosis not present

## 2022-03-31 DIAGNOSIS — I2510 Atherosclerotic heart disease of native coronary artery without angina pectoris: Secondary | ICD-10-CM | POA: Diagnosis not present

## 2022-03-31 DIAGNOSIS — F0390 Unspecified dementia without behavioral disturbance: Secondary | ICD-10-CM | POA: Diagnosis not present

## 2022-03-31 DIAGNOSIS — E119 Type 2 diabetes mellitus without complications: Secondary | ICD-10-CM | POA: Diagnosis not present

## 2022-04-01 DIAGNOSIS — G301 Alzheimer's disease with late onset: Secondary | ICD-10-CM | POA: Diagnosis not present

## 2022-04-01 DIAGNOSIS — I2510 Atherosclerotic heart disease of native coronary artery without angina pectoris: Secondary | ICD-10-CM | POA: Diagnosis not present

## 2022-04-01 DIAGNOSIS — F0390 Unspecified dementia without behavioral disturbance: Secondary | ICD-10-CM | POA: Diagnosis not present

## 2022-04-01 DIAGNOSIS — E119 Type 2 diabetes mellitus without complications: Secondary | ICD-10-CM | POA: Diagnosis not present

## 2022-04-01 DIAGNOSIS — M6281 Muscle weakness (generalized): Secondary | ICD-10-CM | POA: Diagnosis not present

## 2022-04-01 DIAGNOSIS — R269 Unspecified abnormalities of gait and mobility: Secondary | ICD-10-CM | POA: Diagnosis not present

## 2022-04-01 DIAGNOSIS — R2689 Other abnormalities of gait and mobility: Secondary | ICD-10-CM | POA: Diagnosis not present

## 2022-04-01 DIAGNOSIS — W19XXXA Unspecified fall, initial encounter: Secondary | ICD-10-CM | POA: Diagnosis not present

## 2022-04-01 DIAGNOSIS — Z66 Do not resuscitate: Secondary | ICD-10-CM | POA: Diagnosis not present

## 2022-04-01 DIAGNOSIS — Z9181 History of falling: Secondary | ICD-10-CM | POA: Diagnosis not present

## 2022-04-27 DEATH — deceased

## 2022-06-08 IMAGING — MR MR HEAD W/O CM
14 of 15 series · 44 of 48 positions shown · non-contrast
Comparison: 11/08/2017

CLINICAL DATA: Encephalopathy

EXAM:
MRI HEAD WITHOUT CONTRAST
TECHNIQUE: Multiplanar, multiecho pulse sequences of the brain and surrounding
structures were obtained without intravenous contrast.

[Series 5: DWI · axial · 3.0mm · 0.92mm/px · z∈[-80,+76]mm · 6 of 112 slices shown (1 of 4)]
[im 1/112]
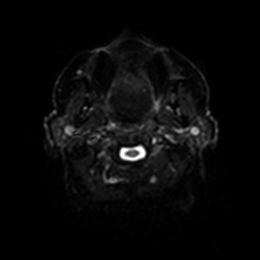
[im 23/112]
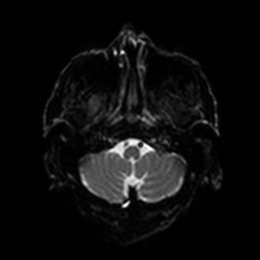
[im 45/112]
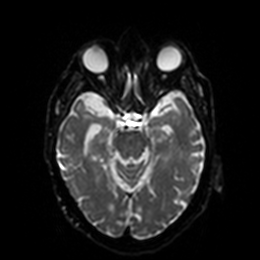
[im 67/112]
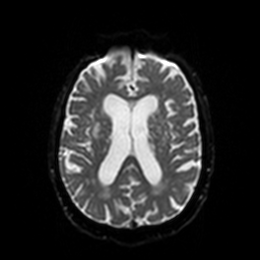
[im 89/112]
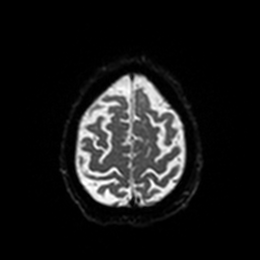
[im 112/112]
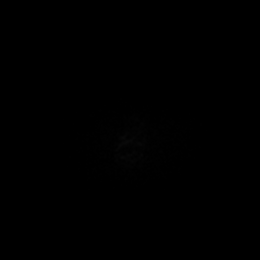

[Series 6: DWI · axial · 3.0mm · 0.92mm/px · z∈[-80,+76]mm · 4 of 56 slices shown (2 of 4)]
[im 1/56]
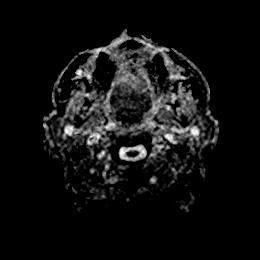
[im 19/56]
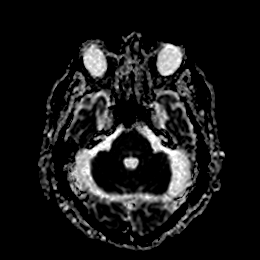
[im 37/56]
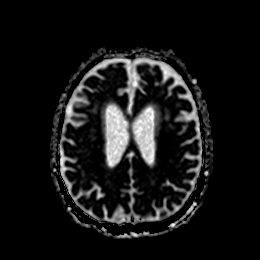
[im 56/56]
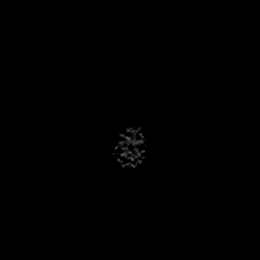

[Series 7: DWI · coronal · 4.0mm · 0.88mm/px · 5 of 82 slices shown (3 of 4)]
[im 1/82]
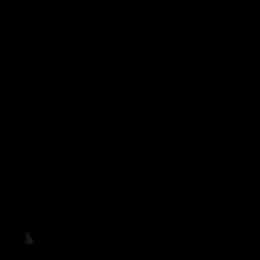
[im 21/82]
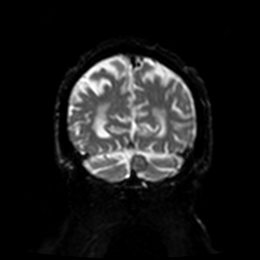
[im 41/82]
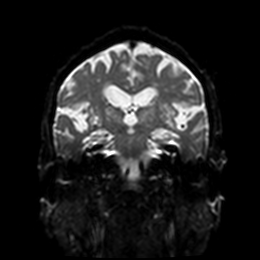
[im 61/82]
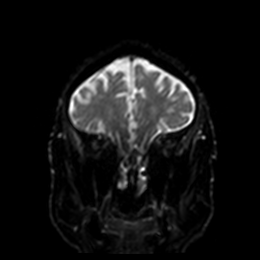
[im 82/82]
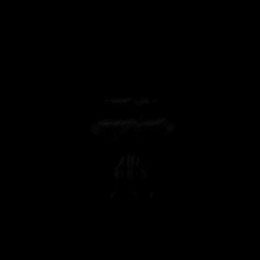

[Series 8: DWI · coronal · 4.0mm · 0.88mm/px · 3 of 41 slices shown (4 of 4)]
[im 1/41]
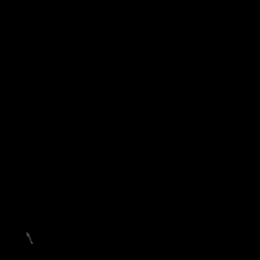
[im 21/41]
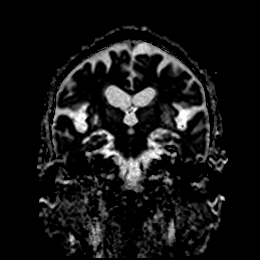
[im 41/41]
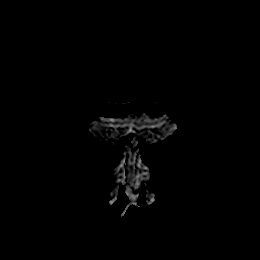

[Series 9: FLAIR · axial · 5.0mm · 0.47mm/px · z∈[-88,+77]mm · 2 of 30 slices shown]
[im 1/30]
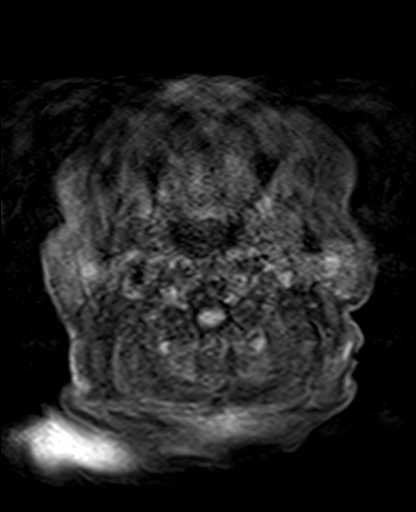
[im 30/30]
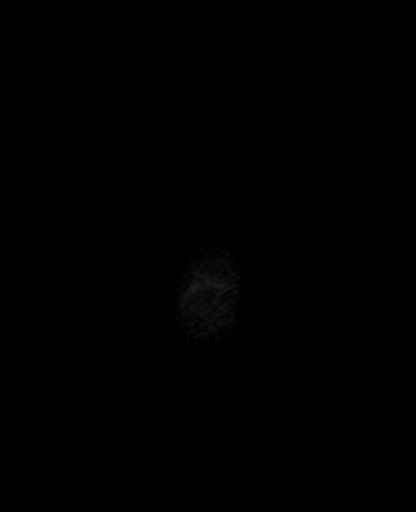

[Series 10: mag_images · axial · 3.0mm · 0.94mm/px · z∈[-89,+78]mm · 4 of 60 slices shown]
[im 1/60]
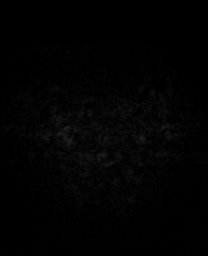
[im 20/60]
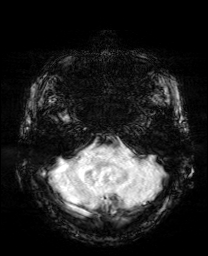
[im 40/60]
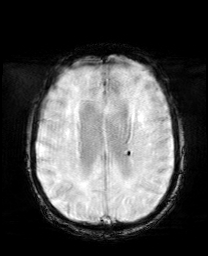
[im 60/60]
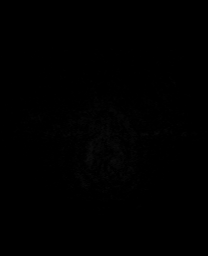

[Series 11: pha_images · axial · 3.0mm · 0.94mm/px · z∈[-84,+69]mm · 3 of 55 slices shown]
[im 1/55]
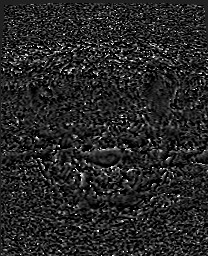
[im 28/55]
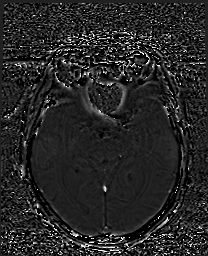
[im 55/55]
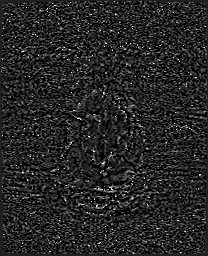

[Series 12: swi_images · axial · 3.0mm · 0.94mm/px · z∈[-89,+78]mm · 4 of 60 slices shown]
[im 1/60]
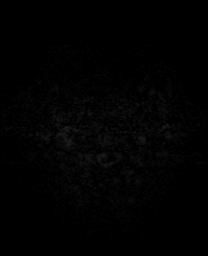
[im 20/60]
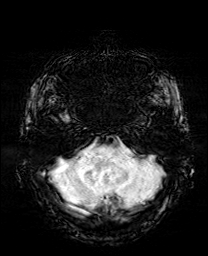
[im 40/60]
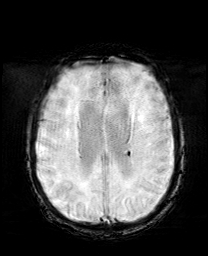
[im 60/60]
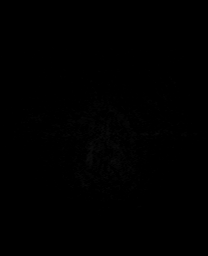

[Series 13: mip_images(sw) · axial · 24.0mm · 0.94mm/px · z∈[-79,+68]mm · 3 of 53 slices shown]
[im 1/53]
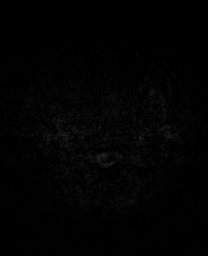
[im 27/53]
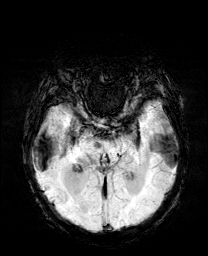
[im 53/53]
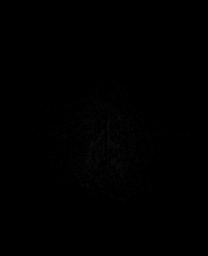

[Series 14: T1 · sagittal · 5.0mm · 0.78mm/px · 2 of 30 slices shown (1 of 2)]
[im 1/30]
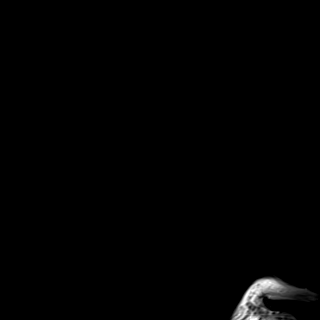
[im 30/30]
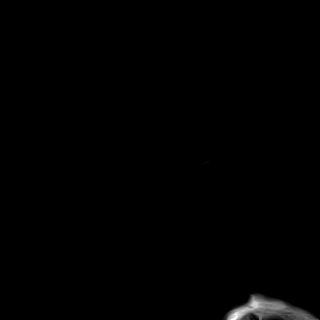

[Series 15: T2 · axial · 5.0mm · 0.75mm/px · z∈[-90,+75]mm · 2 of 30 slices shown (1 of 3)]
[im 1/30]
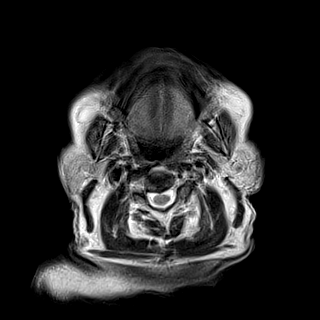
[im 30/30]
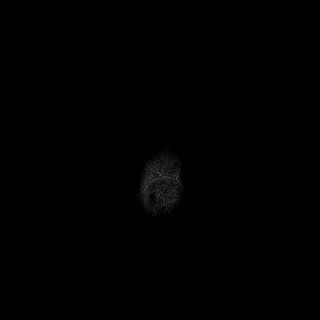

[Series 17: T2 · coronal · 5.0mm · 0.43mm/px · 2 of 34 slices shown (2 of 3)]
[im 1/34]
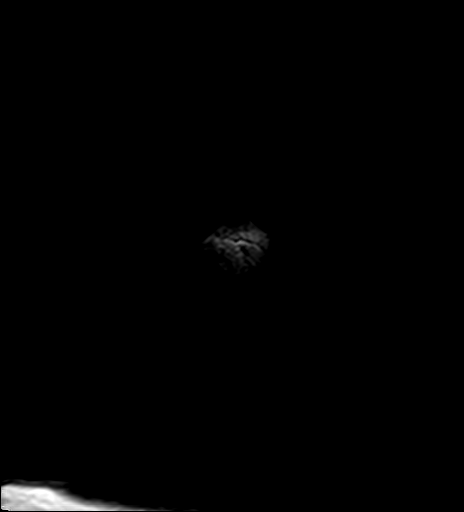
[im 34/34]
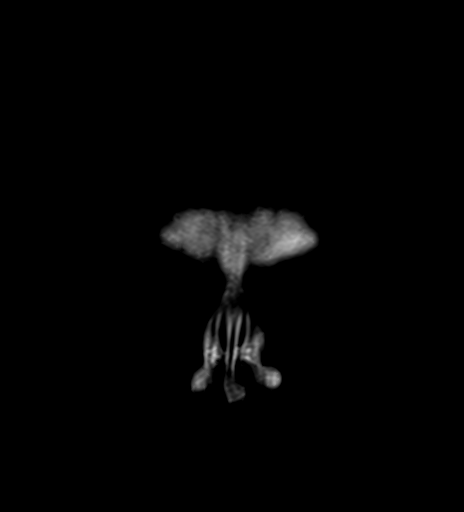

[Series 18: T1 · sagittal · 5.0mm · 0.78mm/px · 2 of 30 slices shown (2 of 2)]
[im 1/30]
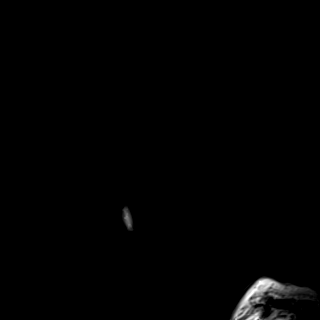
[im 30/30]
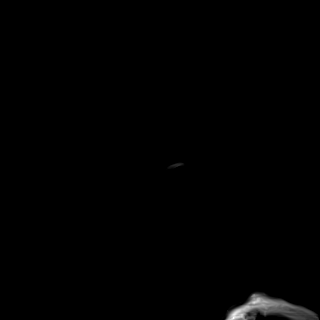

[Series 19: T2 · coronal · 5.0mm · 0.43mm/px · 2 of 34 slices shown (3 of 3)]
[im 1/34]
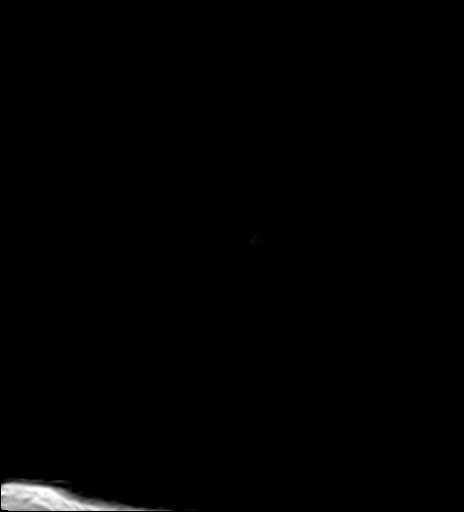
[im 34/34]
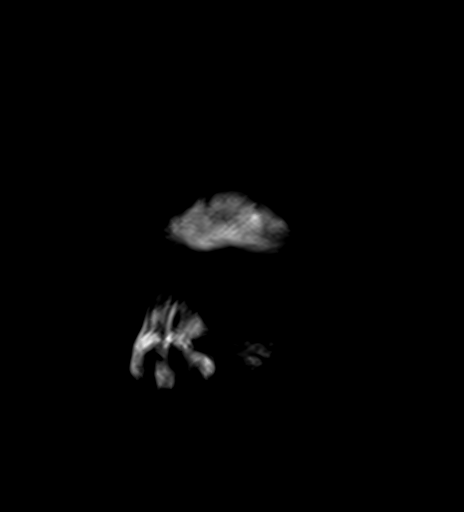

[44 of 48 positions shown; findings below may reference images not displayed]

FINDINGS: Brain: No acute infarct, mass effect or extra-axial collection.
Single focus of left parietal periventricular chronic
microhemorrhage. There is multifocal hyperintense T2-weighted signal
within the white matter. Generalized volume loss without a clear
lobar predilection. The midline structures are normal. Unchanged
left posterior fossa meningioma.

Vascular: Chronic abnormal right vertebral artery flow void.

Skull and upper cervical spine: Normal calvarium and skull base.
Visualized upper cervical spine and soft tissues are normal.

Sinuses/Orbits:No paranasal sinus fluid levels or advanced mucosal
thickening. No mastoid or middle ear effusion. Normal orbits.
IMPRESSION: 1. No acute intracranial abnormality.
2. Generalized volume loss and findings of chronic microvascular
ischemia.

## 2022-06-10 IMAGING — CT CT ANGIO CHEST
3 of 7 series · 18 of 36 positions shown · IV contrast (omnipaque)
Comparison: Portable chest 12/24/2020.

CLINICAL DATA: 88-year-old male with increase shortness of breath.
Abnormal D-dimer.

EXAM:
CT ANGIOGRAPHY CHEST WITH CONTRAST
TECHNIQUE: Multidetector CT imaging of the chest was performed using the
standard protocol during bolus administration of intravenous
contrast. Multiplanar CT image reconstructions and MIPs were
obtained to evaluate the vascular anatomy.
CONTRAST:  75mL OMNIPAQUE IOHEXOL 350 MG/ML SOLN

[Series 6: pe lung · axial · 0.75mm/px · z∈[+1450,+1512]mm · 2 of 126 slices shown]
[im 32/126  mediastinal]
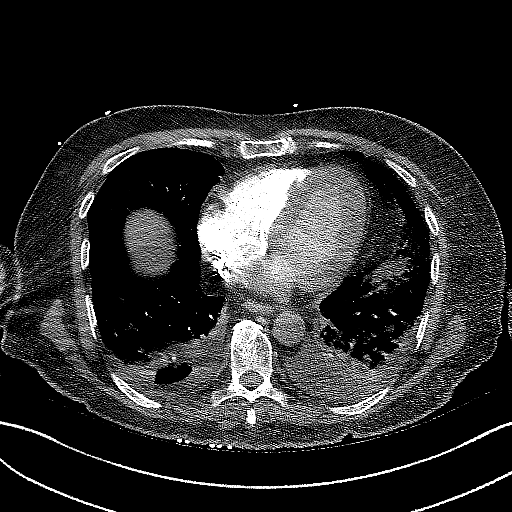
[im 63/126  mediastinal]
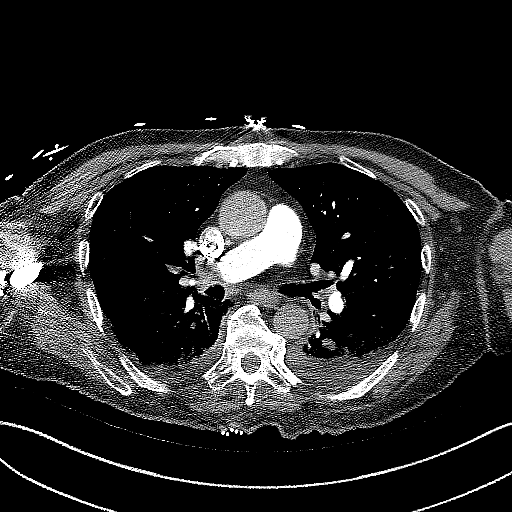

[Series 7: pe thins · axial · 0.75mm/px · z∈[+1346,+1618]mm · 15 of 445 slices shown]
[im 28/445  lung]
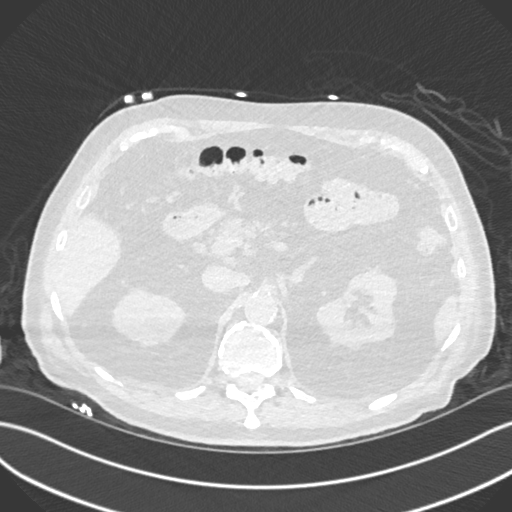
[im 56/445  mediastinal]
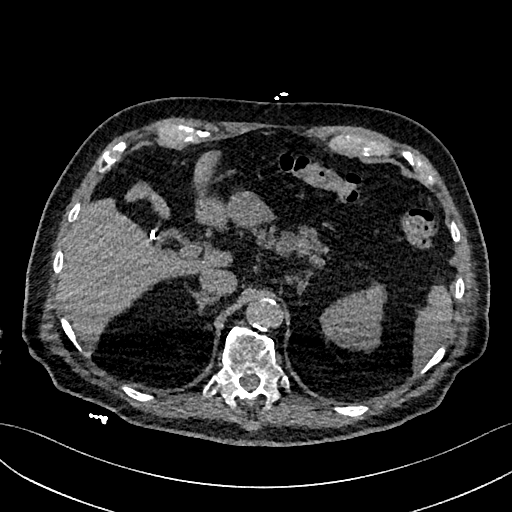
[im 84/445  lung]
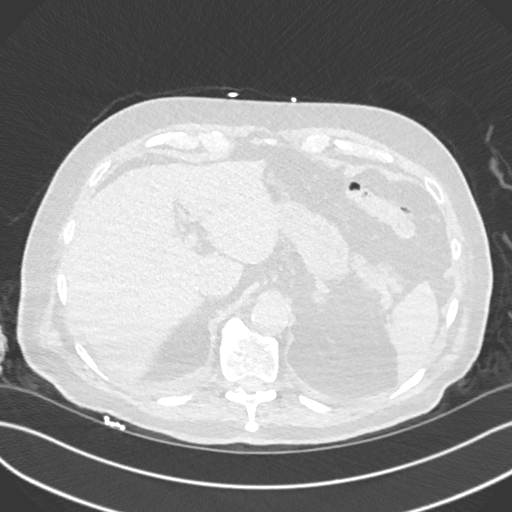
[im 112/445  mediastinal]
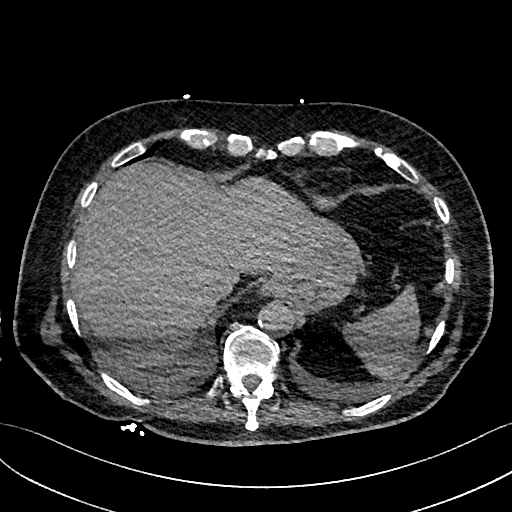
[im 139/445  lung]
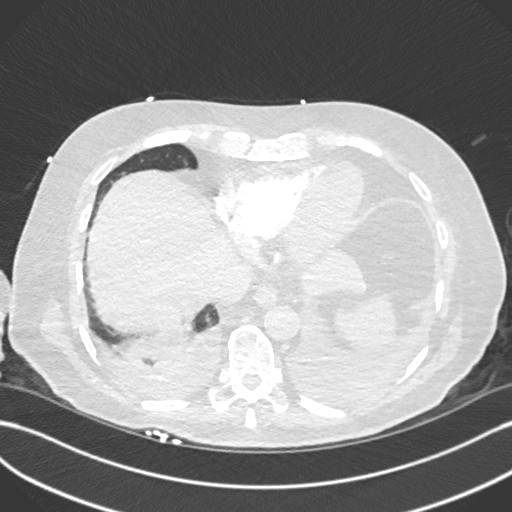
[im 167/445  mediastinal]
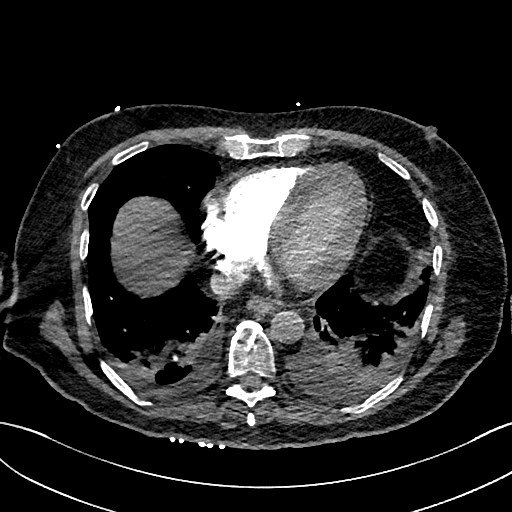
[im 195/445  lung]
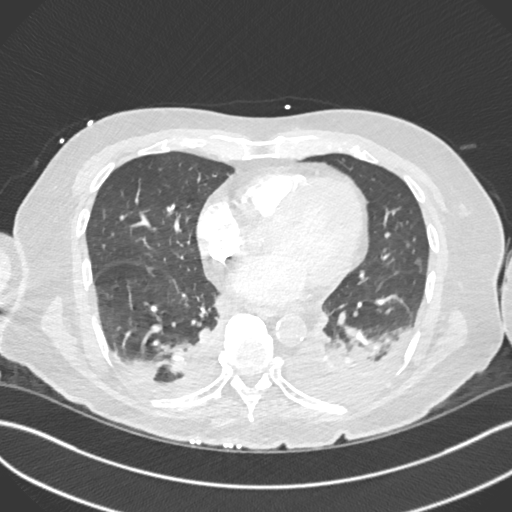
[im 223/445  mediastinal]
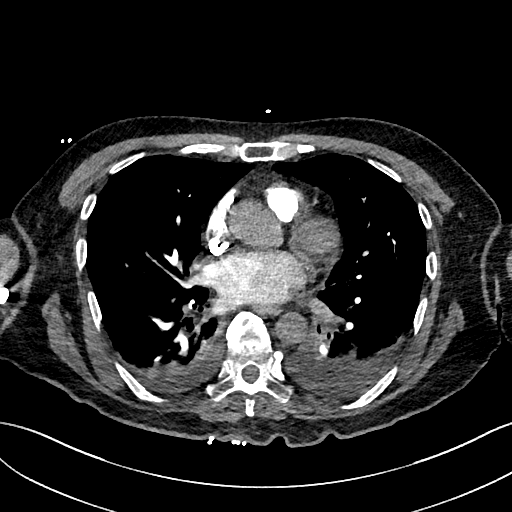
[im 250/445  lung]
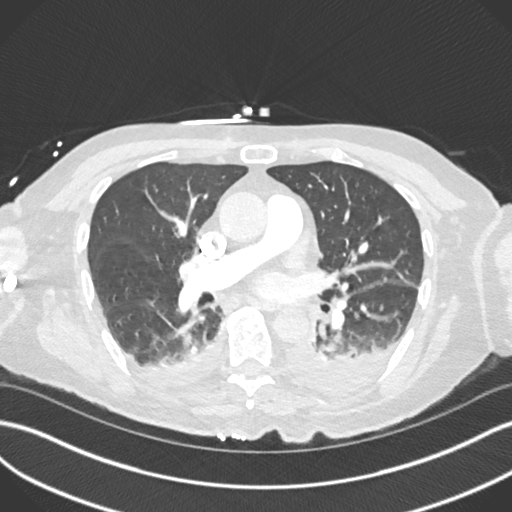
[im 278/445  mediastinal]
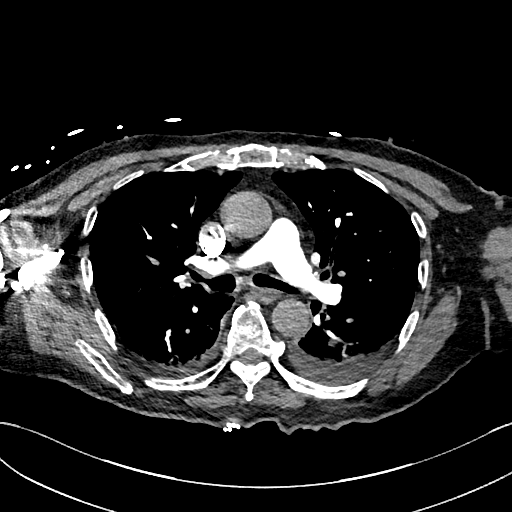
[im 306/445  lung]
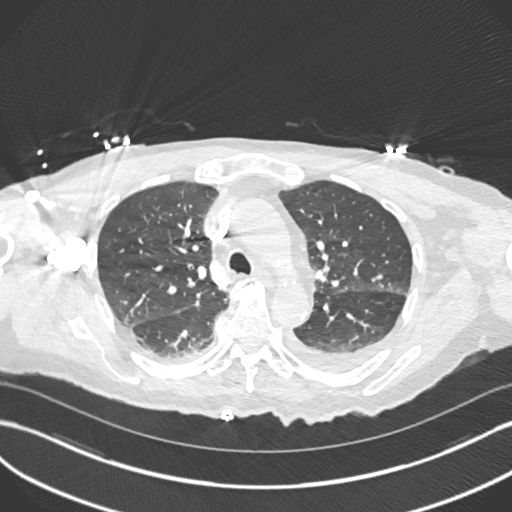
[im 334/445  mediastinal]
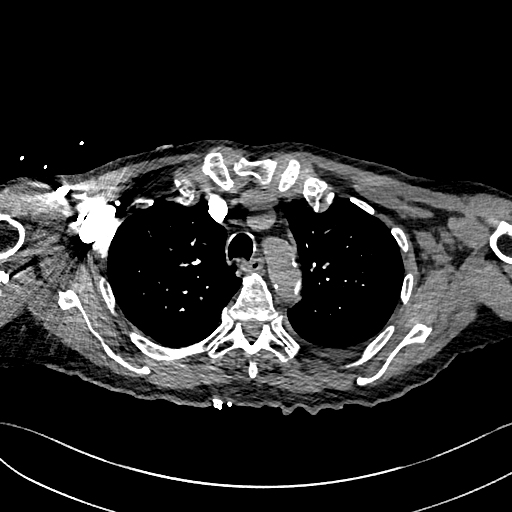
[im 361/445  lung]
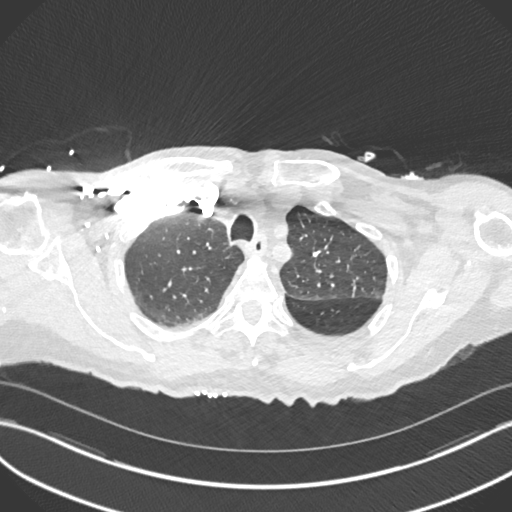
[im 389/445  mediastinal]
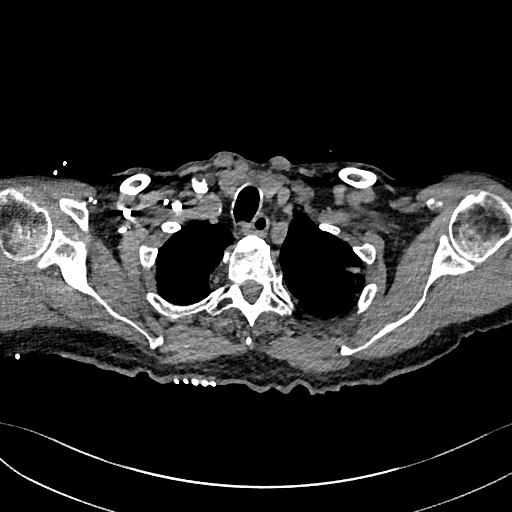
[im 417/445  lung]
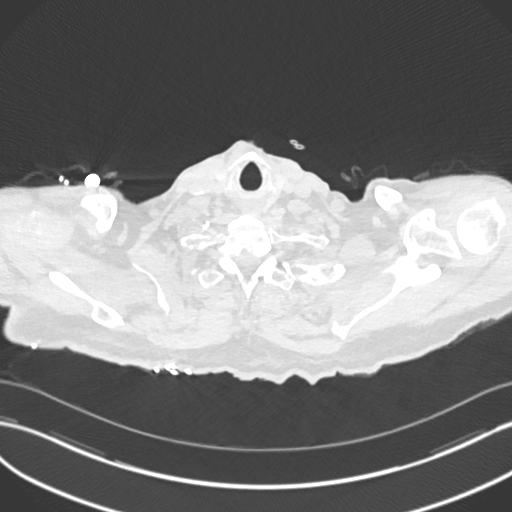

[Series 8: pe 2mm cor · coronal · 0.61mm/px · 1 of 151 slices shown]
[im 76/151  mediastinal]
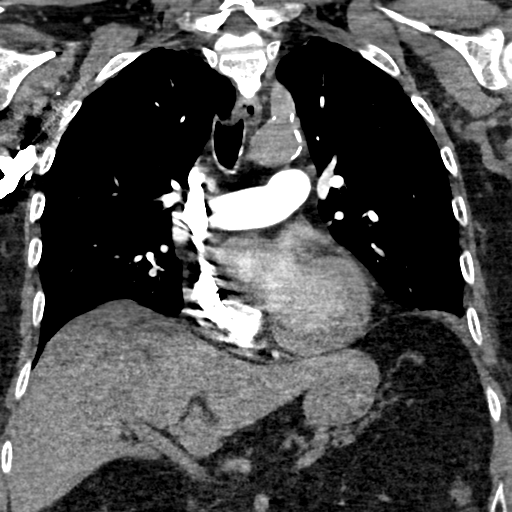

[18 of 36 positions shown; findings below may reference images not displayed]

FINDINGS: Cardiovascular: Good contrast bolus timing in the pulmonary arterial
tree.

No focal filling defect identified in the pulmonary arteries to
suggest acute pulmonary embolism.

Mild respiratory motion. Calcified aortic atherosclerosis. No
contrast in the aorta. No cardiomegaly or pericardial effusion.
Calcified coronary artery atherosclerosis is extensive (series 7,
image 214).

Mediastinum/Nodes: Negative.  No mediastinal lymphadenopathy.

Lungs/Pleura: Small layering pleural effusions greater on the left.
Atelectatic changes to the major airways. Retained secretions in the
dependent right trachea and at the carina (series 6, image 52). But
otherwise the major airways are patent. There is confluent bilateral
lower lobe opacity, although a paucity of air bronchograms. The
superior segments are relatively spared. Additional dependent
atelectasis along the major fissures. Superimposed peripheral left
upper lobe nodular and spiculated opacity with regional scarring is
10 x 12 mm. See series 6, image 21 and series 8, image 81. Some of
the nearby linear scarring is calcified.

Upper Abdomen: Absent gallbladder. Negative visible noncontrast
liver, spleen, pancreas, adrenal glands, kidneys, and bowel in the
upper abdomen.

Musculoskeletal: Flowing endplate osteophytes result in widespread
thoracic ankylosis. No acute osseous abnormality identified. Chronic
eleventh costovertebral junction rib fracture.

Review of the MIP images confirms the above findings.
IMPRESSION: 1. Negative for acute pulmonary embolus.

2. Small layering pleural effusions, greater on the left, with
confluent bilateral lower lobe opacity. Some retained secretions in
the trachea. Consider aspiration and/or pneumonia versus
atelectasis.

3. Superimposed peripheral left upper lobe 11 mm spiculated opacity
with regional scarring. Consider a non-contrast Chest CT at 3
months, a PET/CT, or tissue sampling.
These guidelines do not apply to immunocompromised patients and
patients with cancer. Follow up in patients with significant
comorbidities as clinically warranted. For lung cancer screening,
adhere to Lung-RADS guidelines. Reference: Radiology. 5828;
284(1):228-43.

4. Calcified coronary artery and Aortic Atherosclerosis
([id number]).
